# Patient Record
Sex: Female | Born: 1950
Health system: Southern US, Community
[De-identification: ages and names within clinical notes are randomized; demographics above are authoritative.]

## PROBLEM LIST (undated history)

## (undated) DIAGNOSIS — J189 Pneumonia, unspecified organism: Secondary | ICD-10-CM

## (undated) DIAGNOSIS — G4730 Sleep apnea, unspecified: Secondary | ICD-10-CM

## (undated) DIAGNOSIS — Z86718 Personal history of other venous thrombosis and embolism: Secondary | ICD-10-CM

## (undated) DIAGNOSIS — F32A Depression, unspecified: Secondary | ICD-10-CM

## (undated) DIAGNOSIS — R609 Edema, unspecified: Secondary | ICD-10-CM

## (undated) DIAGNOSIS — E7800 Pure hypercholesterolemia, unspecified: Secondary | ICD-10-CM

## (undated) DIAGNOSIS — M1990 Unspecified osteoarthritis, unspecified site: Secondary | ICD-10-CM

## (undated) DIAGNOSIS — I10 Essential (primary) hypertension: Secondary | ICD-10-CM

## (undated) HISTORY — DX: Personal history of other venous thrombosis and embolism: Z86.718

## (undated) HISTORY — DX: Sleep apnea, unspecified: G47.30

## (undated) HISTORY — DX: Unspecified osteoarthritis, unspecified site: M19.90

## (undated) HISTORY — PX: CARPAL TUNNEL RELEASE: SHX101

## (undated) HISTORY — DX: Pneumonia, unspecified organism: J18.9

## (undated) HISTORY — PX: LIPOMA EXCISION: SHX5283

## (undated) HISTORY — PX: REPLACEMENT TOTAL KNEE: SUR1224

## (undated) HISTORY — DX: Pure hypercholesterolemia, unspecified: E78.00

## (undated) HISTORY — DX: Edema, unspecified: R60.9

## (undated) HISTORY — DX: Essential (primary) hypertension: I10

## (undated) HISTORY — DX: Depression, unspecified: F32.A

---

## 2012-06-29 ENCOUNTER — Encounter: Payer: Self-pay | Admitting: Family Medicine

## 2012-06-29 ENCOUNTER — Ambulatory Visit (INDEPENDENT_AMBULATORY_CARE_PROVIDER_SITE_OTHER): Payer: BC Managed Care – PPO | Admitting: Family Medicine

## 2012-06-29 ENCOUNTER — Other Ambulatory Visit: Payer: Self-pay | Admitting: Family Medicine

## 2012-06-29 VITALS — BP 155/74 | HR 65 | Ht 63.75 in | Wt 237.0 lb

## 2012-06-29 DIAGNOSIS — Z1211 Encounter for screening for malignant neoplasm of colon: Secondary | ICD-10-CM

## 2012-06-29 DIAGNOSIS — I10 Essential (primary) hypertension: Secondary | ICD-10-CM

## 2012-06-29 DIAGNOSIS — R229 Localized swelling, mass and lump, unspecified: Secondary | ICD-10-CM

## 2012-06-29 DIAGNOSIS — R2232 Localized swelling, mass and lump, left upper limb: Secondary | ICD-10-CM

## 2012-06-29 NOTE — Progress Notes (Signed)
Subjective:    Patient ID: Margaret Smith, female    DOB: 02-16-1951, 61 y.o.   MRN: [id number]  HPI HTN- BPs 130-160s.  Father died of pancreatic cancer about a month ago. She is here to establish care.  She was told she had high blood pressure about 3 years ago with her previous position. She has not been back since. She says she's been trying to work on exercise and diet. They recently she has not been working out 2 days per week. She says she used to walk daily. She denies any chest pain or short of breath or dizziness. She does have a very strong family history of high blood pressure. No prior history of kidney disease.   Review of Systems  Constitutional: Negative for fever, diaphoresis and unexpected weight change.  HENT: Negative for hearing loss, rhinorrhea and tinnitus.   Eyes: Negative for visual disturbance.  Respiratory: Negative for cough and wheezing.   Cardiovascular: Negative for chest pain and palpitations.  Gastrointestinal: Negative for nausea, vomiting, diarrhea and blood in stool.  Genitourinary: Negative for vaginal bleeding, vaginal discharge and difficulty urinating.  Musculoskeletal: Positive for myalgias and joint swelling. Negative for arthralgias.  Skin: Negative for rash.  Neurological: Negative for headaches.  Hematological: Negative for adenopathy. Does not bruise/bleed easily.  Psychiatric/Behavioral: Positive for disturbed wake/sleep cycle. Negative for dysphoric mood. The patient is not nervous/anxious.      BP 155/74  Pulse 65  Ht 5' 3.75" (1.619 m)  Wt 237 lb (107.502 kg)  BMI 41.00 kg/m2    Not on File  Past Medical History  Diagnosis Date  . Hypertension   . Pneumonia     Past Surgical History  Procedure Date  . Carpal tunnel release 1980s    bilateral.   . Lipoma excision     left forearm.     History   Social History  . Marital Status: Single    Spouse Name: N/A    Number of Children: N/A  . Years of Education: N/A    Occupational History  . Attention officer     [employer]   Social History Main Topics  . Smoking status: Former Smoker    Quit date: 04/08/2004  . Smokeless tobacco: Not on file  . Alcohol Use: No  . Drug Use: No  . Sexually Active: No   Other Topics Concern  . Not on file   Social History Narrative   Walks for exercise 1-2 days per week for 45 min. she lives and takes care of her mother.    Family History  Problem Relation Age of Onset  . Hypertension Mother   . Hypertension Father   . Hypertension Sister   . Hypertension Brother   . Hyperlipidemia Mother   . Diabetes Mother   . Diabetes Father   . Diabetes Maternal Grandfather   . Breast cancer Paternal Aunt   . Heart attack Mother   . Pancreatic cancer Father   . Stroke Mother   . Kidney disease Sister   . Seizures Mother     No outpatient encounter prescriptions on file as of 06/29/2012.          Objective:   Physical Exam  Constitutional: She is oriented to person, place, and time. She appears well-developed and well-nourished.  HENT:  Head: Normocephalic and atraumatic.  Cardiovascular: Normal rate, regular rhythm and normal heart sounds.        No carotid or orbital bruits.  Pulmonary/Chest: Effort normal and  breath sounds normal.  Abdominal: Soft. Bowel sounds are normal. She exhibits no distension and no mass. There is no tenderness. There is no rebound and no guarding.  Musculoskeletal: She exhibits no edema.  Neurological: She is alert and oriented to person, place, and time.  Skin: Skin is warm and dry.  Psychiatric: She has a normal mood and affect. Her behavior is normal.          Assessment & Plan:  HTN- DASH diet.  Work on diet and exercise.  Due to check CMP, Lipids, TSH for new dx of HTN.  We discussed setting a time frame and if not able to get blood pressure under control in that time. Then we need to start a blood pressure lowering agent. We discussed the risks of the  untreated high blood pressure including renal disease, heart attack, and stroke. Hopefully with the DASH diet she can lower her blood pressure by about 5-10 points but she also has a [place] family history of high blood pressure. Followup in 8 weeks. At that time her blood pressures not well controlled then we will start a medication. Patient agrees.  Due for screening colonoscopy. She's okay with referral.  She also has a very large cyst on her middle finger of the left hand. She says it's been there for over a year. Most of the time doesn't bother her unless she bumps it but she would like to have it removed. Will refer her to a Hydrographic surveyor.  She plans to schedule physical at her followup in 8 weeks.

## 2012-06-29 NOTE — Patient Instructions (Signed)

## 2012-06-30 LAB — COMPLETE METABOLIC PANEL WITH GFR
ALT: 14 U/L (ref 0–35)
Albumin: 4.3 g/dL (ref 3.5–5.2)
CO2: 27 mEq/L (ref 19–32)
Calcium: 9.5 mg/dL (ref 8.4–10.5)
Chloride: 106 mEq/L (ref 96–112)
GFR, Est African American: >89 mL/min
Potassium: 4.5 mEq/L (ref 3.5–5.3)
Sodium: 142 mEq/L (ref 135–145)
Total Protein: 7.3 g/dL (ref 6.0–8.3)

## 2012-06-30 LAB — LIPID PANEL: LDL Cholesterol: 122 mg/dL — ABNORMAL HIGH (ref 0–99)

## 2012-06-30 LAB — THYROID PANEL WITH TSH
Free Thyroxine Index: 2.9 (ref 1.0–3.9)
T4, Total: 7.7 ug/dL (ref 5.0–12.5)
TSH: 1.965 u[IU]/mL (ref 0.350–4.500)

## 2012-07-03 ENCOUNTER — Ambulatory Visit (INDEPENDENT_AMBULATORY_CARE_PROVIDER_SITE_OTHER): Payer: BC Managed Care – PPO

## 2012-07-03 ENCOUNTER — Other Ambulatory Visit: Payer: Self-pay | Admitting: Orthopedic Surgery

## 2012-07-03 DIAGNOSIS — R229 Localized swelling, mass and lump, unspecified: Secondary | ICD-10-CM

## 2012-07-03 DIAGNOSIS — IMO0001 Reserved for inherently not codable concepts without codable children: Secondary | ICD-10-CM

## 2012-09-17 ENCOUNTER — Encounter: Payer: Self-pay | Admitting: *Deleted

## 2012-09-17 LAB — HM COLONOSCOPY

## 2013-11-12 ENCOUNTER — Telehealth: Payer: Self-pay | Admitting: Family Medicine

## 2013-11-12 DIAGNOSIS — Z0000 Encounter for general adult medical examination without abnormal findings: Secondary | ICD-10-CM

## 2013-11-12 LAB — HEMOGLOBIN A1C: Hgb A1c MFr Bld: 5.9 % (ref 4.0–6.0)

## 2013-11-12 LAB — LIPID PANEL
Cholesterol: 184 mg/dL (ref 0–200)
LDL Cholesterol: 98 mg/dL
Triglycerides: 139 mg/dL (ref 40–160)

## 2013-11-12 NOTE — Telephone Encounter (Signed)
Pt called. She has scheduled an appt for cpe/fasting  on 12/19 at 3:00

## 2013-11-12 NOTE — Telephone Encounter (Signed)
Pt called and scheduled a cpe/fasting on 12/19 at 3:00. She wants to know if we can have lab Order ready at 2:30 on day of her appt.

## 2013-11-16 ENCOUNTER — Other Ambulatory Visit: Payer: Self-pay | Admitting: Family Medicine

## 2013-11-16 DIAGNOSIS — Z1231 Encounter for screening mammogram for malignant neoplasm of breast: Secondary | ICD-10-CM

## 2013-11-18 NOTE — Telephone Encounter (Signed)
Attempted to call pt to inform her that labs have been placed and faxed no vm.Margaret Smith [place]

## 2013-11-22 NOTE — Telephone Encounter (Signed)
Left message to inform pt that labs have been ordered.[name]

## 2013-11-26 ENCOUNTER — Other Ambulatory Visit (HOSPITAL_COMMUNITY)
Admission: RE | Admit: 2013-11-26 | Discharge: 2013-11-26 | Disposition: A | Discharge status: Home / Self Care | Payer: BC Managed Care – PPO | Source: Ambulatory Visit | Attending: Family Medicine | Admitting: Family Medicine

## 2013-11-26 ENCOUNTER — Ambulatory Visit (INDEPENDENT_AMBULATORY_CARE_PROVIDER_SITE_OTHER): Payer: BC Managed Care – PPO | Admitting: Family Medicine

## 2013-11-26 ENCOUNTER — Encounter: Payer: Self-pay | Admitting: Family Medicine

## 2013-11-26 ENCOUNTER — Encounter: Payer: Self-pay | Admitting: *Deleted

## 2013-11-26 VITALS — BP 128/72 | HR 73 | Temp 98.0°F | Ht 63.0 in | Wt 196.0 lb

## 2013-11-26 DIAGNOSIS — Z0000 Encounter for general adult medical examination without abnormal findings: Secondary | ICD-10-CM

## 2013-11-26 DIAGNOSIS — Z01419 Encounter for gynecological examination (general) (routine) without abnormal findings: Secondary | ICD-10-CM | POA: Insufficient documentation

## 2013-11-26 DIAGNOSIS — Z1151 Encounter for screening for human papillomavirus (HPV): Secondary | ICD-10-CM | POA: Insufficient documentation

## 2013-11-26 DIAGNOSIS — E669 Obesity, unspecified: Secondary | ICD-10-CM | POA: Insufficient documentation

## 2013-11-26 DIAGNOSIS — Z23 Encounter for immunization: Secondary | ICD-10-CM

## 2013-11-26 DIAGNOSIS — Z124 Encounter for screening for malignant neoplasm of cervix: Secondary | ICD-10-CM

## 2013-11-26 NOTE — Patient Instructions (Signed)
Keep up a regular exercise program and make sure you are eating a healthy diet Try to eat 4 servings of dairy a day, or if you are lactose intolerant take a calcium with vitamin D daily.  Your vaccines are up to date.   

## 2013-11-26 NOTE — Progress Notes (Signed)
Subjective:     Margaret Smith is a 62 y.o. female and is here for a comprehensive physical exam. The patient reports no problems.  History   Social History  . Marital Status: Single    Spouse Name: N/A    Number of Children: N/A  . Years of Education: N/A   Occupational History  . 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 Smoking status: Former Smoker    Quit date: 04/08/2004  . Smokeless tobacco: Not on file  . Alcohol Use: No  . Drug Use: No  . Sexual Activity: No   Other Topics Concern  . Not on file   Social History Narrative   Walks for exercise 1-2 days per week for 45 min. she lives and takes care of her mother.   Health Maintenance  Topic Date Due  . Pap Smear  03/17/1970  . Tetanus/tdap  03/18/1971  . Mammogram  03/17/2002  . Zostavax  03/17/2012  . Influenza Vaccine  07/09/2013  . Colonoscopy  09/17/2022    The following portions of the patient's history were reviewed and updated as appropriate: allergies, current medications, past family history, past medical history, past social history, past surgical history and problem list.  Review of Systems A comprehensive review of systems was negative.   Objective:    BP 158/86  Pulse 73  Temp(Src) 98 F (36.7 C)  Ht 5\' 3"  (1.6 m)  Wt 196 lb (88.905 kg)  BMI 34.73 kg/m2 General appearance: alert, cooperative and appears stated age Head: Normocephalic, without obvious abnormality, atraumatic Eyes: conj clear, EOMi, PEERLA Ears: normal TM's and external ear canals both ears Nose: Nares normal. Septum midline. Mucosa normal. No drainage or sinus tenderness. Throat: lips, mucosa, and tongue normal; teeth and gums normal Neck: no adenopathy, no carotid bruit, no JVD, supple, symmetrical, trachea midline and thyroid not enlarged, symmetric, no tenderness/mass/nodules Back: symmetric, no curvature. ROM normal. No CVA tenderness. Lungs: clear to auscultation bilaterally Breasts:  normal appearance, no masses or tenderness Heart: regular rate and rhythm, S1, S2 normal, no murmur, click, rub or gallop Abdomen: soft, non-tender; bowel sounds normal; no masses,  no organomegaly Pelvic: cervix normal in appearance, external genitalia normal, no adnexal masses or tenderness, no cervical motion tenderness, rectovaginal septum normal, uterus normal size, shape, and consistency and vagina normal without discharge Extremities: extremities normal, atraumatic, no cyanosis or edema Pulses: 2+ and symmetric Skin: Skin color, texture, turgor normal. No rashes or lesions Lymph nodes: Cervical, supraclavicular, and axillary nodes normal. Neurologic: Alert and oriented X 3, normal strength and tone. Normal symmetric reflexes. Normal coordination and gait    Assessment:    Healthy female exam.      Plan:     See After Visit Summary for Counseling Recommendations  Mammogram history been scheduled. She is Re: had her flu shot this year. Tdap and Prevnar 13 given today. Pap smear performed today. We'll call with results once available. Keep up a regular exercise program and make sure you are eating a healthy diet Try to eat 4 servings of dairy a day, or if you are lactose intolerant take a calcium with vitamin D daily.  Your vaccines are up to date.  Discussed need for shingles vaccine. She will check with her insurance first. Handout provided.  Obesity/BMI 34-she has an absolutely fantastic. She's actually lost 40 pounds since I last saw her in July. Keep up the good work with regular exercise and  diet. This is absolutely fantastic.

## 2013-11-27 LAB — LIPID PANEL
Cholesterol: 174 mg/dL (ref 0–200)
HDL: 58 mg/dL (ref >39)
Triglycerides: 51 mg/dL (ref <150)
VLDL: 10 mg/dL (ref 0–40)

## 2013-11-27 LAB — COMPLETE METABOLIC PANEL WITH GFR
AST: 15 U/L (ref 0–37)
BUN: 21 mg/dL (ref 6–23)
CO2: 27 mEq/L (ref 19–32)
Calcium: 9 mg/dL (ref 8.4–10.5)
Chloride: 107 mEq/L (ref 96–112)
Creat: 0.64 mg/dL (ref 0.50–1.10)
GFR, Est African American: >89 mL/min
GFR, Est Non African American: >89 mL/min
Potassium: 4.4 mEq/L (ref 3.5–5.3)
Total Bilirubin: 0.4 mg/dL (ref 0.3–1.2)

## 2013-11-30 ENCOUNTER — Ambulatory Visit (INDEPENDENT_AMBULATORY_CARE_PROVIDER_SITE_OTHER): Payer: BC Managed Care – PPO

## 2013-11-30 ENCOUNTER — Encounter: Payer: Self-pay | Admitting: *Deleted

## 2013-11-30 DIAGNOSIS — Z1231 Encounter for screening mammogram for malignant neoplasm of breast: Secondary | ICD-10-CM

## 2013-11-30 DIAGNOSIS — R928 Other abnormal and inconclusive findings on diagnostic imaging of breast: Secondary | ICD-10-CM

## 2013-12-14 ENCOUNTER — Other Ambulatory Visit: Payer: Self-pay | Admitting: Family Medicine

## 2013-12-14 DIAGNOSIS — R928 Other abnormal and inconclusive findings on diagnostic imaging of breast: Secondary | ICD-10-CM

## 2013-12-16 ENCOUNTER — Other Ambulatory Visit: Payer: BC Managed Care – PPO

## 2013-12-17 ENCOUNTER — Ambulatory Visit
Admission: RE | Admit: 2013-12-17 | Discharge: 2013-12-17 | Disposition: A | Discharge status: Home / Self Care | Payer: BC Managed Care – PPO | Source: Ambulatory Visit | Attending: Family Medicine | Admitting: Family Medicine

## 2013-12-17 DIAGNOSIS — R928 Other abnormal and inconclusive findings on diagnostic imaging of breast: Secondary | ICD-10-CM

## 2014-05-10 ENCOUNTER — Other Ambulatory Visit: Payer: Self-pay | Admitting: *Deleted

## 2014-05-10 DIAGNOSIS — N6320 Unspecified lump in the left breast, unspecified quadrant: Secondary | ICD-10-CM

## 2014-06-16 ENCOUNTER — Encounter (INDEPENDENT_AMBULATORY_CARE_PROVIDER_SITE_OTHER): Payer: Self-pay

## 2014-06-16 ENCOUNTER — Ambulatory Visit
Admission: RE | Admit: 2014-06-16 | Discharge: 2014-06-16 | Disposition: A | Discharge status: Home / Self Care | Payer: BC Managed Care – PPO | Source: Ambulatory Visit | Attending: Family Medicine | Admitting: Family Medicine

## 2014-06-16 DIAGNOSIS — N6320 Unspecified lump in the left breast, unspecified quadrant: Secondary | ICD-10-CM

## 2014-11-16 ENCOUNTER — Other Ambulatory Visit: Payer: Self-pay | Admitting: Family Medicine

## 2014-11-16 DIAGNOSIS — Z1231 Encounter for screening mammogram for malignant neoplasm of breast: Secondary | ICD-10-CM

## 2014-12-07 ENCOUNTER — Ambulatory Visit (INDEPENDENT_AMBULATORY_CARE_PROVIDER_SITE_OTHER): Payer: BC Managed Care – PPO

## 2014-12-07 DIAGNOSIS — Z1231 Encounter for screening mammogram for malignant neoplasm of breast: Secondary | ICD-10-CM

## 2014-12-21 ENCOUNTER — Encounter: Payer: BC Managed Care – PPO | Admitting: Family Medicine

## 2014-12-22 ENCOUNTER — Ambulatory Visit (INDEPENDENT_AMBULATORY_CARE_PROVIDER_SITE_OTHER): Payer: BLUE CROSS/BLUE SHIELD | Admitting: Family Medicine

## 2014-12-22 ENCOUNTER — Encounter: Payer: Self-pay | Admitting: Family Medicine

## 2014-12-22 VITALS — BP 139/78 | HR 71 | Ht 63.0 in | Wt 212.0 lb

## 2014-12-22 DIAGNOSIS — Z0000 Encounter for general adult medical examination without abnormal findings: Secondary | ICD-10-CM | POA: Diagnosis not present

## 2014-12-22 DIAGNOSIS — H6123 Impacted cerumen, bilateral: Secondary | ICD-10-CM

## 2014-12-22 LAB — COMPLETE METABOLIC PANEL WITH GFR
ALT: 17 U/L (ref 0–35)
AST: 21 U/L (ref 0–37)
Albumin: 4 g/dL (ref 3.5–5.2)
Alkaline Phosphatase: 71 U/L (ref 39–117)
BUN: 20 mg/dL (ref 6–23)
CO2: 27 mEq/L (ref 19–32)
CREATININE: 0.65 mg/dL (ref 0.50–1.10)
Calcium: 9.5 mg/dL (ref 8.4–10.5)
Chloride: 106 mEq/L (ref 96–112)
GFR, Est African American: >89 mL/min
GFR, Est Non African American: >89 mL/min
Glucose, Bld: 82 mg/dL (ref 70–99)
Potassium: 4.3 mEq/L (ref 3.5–5.3)
Sodium: 139 mEq/L (ref 135–145)
Total Bilirubin: 0.6 mg/dL (ref 0.2–1.2)
Total Protein: 7.5 g/dL (ref 6.0–8.3)

## 2014-12-22 LAB — LIPID PANEL
CHOLESTEROL: 201 mg/dL — AB (ref 0–200)
HDL: 63 mg/dL (ref >39)
LDL CALC: 124 mg/dL — AB (ref 0–99)
TRIGLYCERIDES: 68 mg/dL (ref <150)
Total CHOL/HDL Ratio: 3.2 Ratio
VLDL: 14 mg/dL (ref 0–40)

## 2014-12-22 NOTE — Progress Notes (Signed)
  Subjective:     Margaret Smith is a 64 y.o. female and is here for a comprehensive physical exam. The patient reports no problems. Getting some walking in for exercise daily for one hour. She has gained some weight this last year.    History   Social History  . Marital Status: Single    Spouse Name: N/A    Number of Children: N/A  . Years of Education: N/A   Occupational History  . 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 Smoking status: Former Smoker    Quit date: 04/08/2004  . Smokeless tobacco: Not on file  . Alcohol Use: No  . Drug Use: No  . Sexual Activity: No   Other Topics Concern  . Not on file   Social History Narrative   Walks for exercise 5-7  days per week for 45 min. she lives and takes care of her mother.   Health Maintenance  Topic Date Due  . HIV Screening  03/18/1967  . ZOSTAVAX  03/17/2012  . INFLUENZA VACCINE  07/09/2014  . PAP SMEAR  11/26/2016  . MAMMOGRAM  12/07/2016  . COLONOSCOPY  09/17/2022  . TETANUS/TDAP  11/27/2023    The following portions of the patient's history were reviewed and updated as appropriate: allergies, current medications, past family history, past medical history, past social history, past surgical history and problem list.  Review of Systems A comprehensive review of systems was negative.   Objective:    BP 139/78 mmHg  Pulse 71  Ht 5\' 3"  (1.6 m)  Wt 212 lb (96.163 kg)  BMI 37.56 kg/m2  SpO2 98% General appearance: alert, cooperative and appears stated age Head: Normocephalic, without obvious abnormality, atraumatic Eyes: conj clear, EOMI, PEERLA Ears: normal TM's and external ear canals both ears Nose: Nares normal. Septum midline. Mucosa normal. No drainage or sinus tenderness. Throat: lips, mucosa, and tongue normal; teeth and gums normal Neck: no adenopathy, no carotid bruit, no JVD, supple, symmetrical, trachea midline and thyroid not enlarged, symmetric, no  tenderness/mass/nodules Back: symmetric, no curvature. ROM normal. No CVA tenderness. Lungs: clear to auscultation bilaterally Breasts: normal appearance, no masses or tenderness Heart: regular rate and rhythm, S1, S2 normal, no murmur, click, rub or gallop Abdomen: soft, non-tender; bowel sounds normal; no masses,  no organomegaly Extremities: extremities normal, atraumatic, no cyanosis or edema Pulses: 2+ and symmetric Skin: Skin color, texture, turgor normal. No rashes or lesions Lymph nodes: Cervical, supraclavicular, and axillary nodes normal. Neurologic: Alert and oriented X 3, normal strength and tone. Normal symmetric reflexes. Normal coordination and gait    Assessment:    Healthy female exam.      Plan:     See After Visit Summary for Counseling Recommendations  Keep up a regular exercise program and make sure you are eating a healthy diet Try to eat 4 servings of dairy a day, or if you are lactose intolerant take a calcium with vitamin D daily.  Your vaccines are up to date.   Bilateral cerumen impaction- Irrigation bilateral.  Tolerated well.

## 2014-12-22 NOTE — Patient Instructions (Signed)
Debrox wax softening drops.  Keep up a regular exercise program and make sure you are eating a healthy diet Try to eat 4 servings of dairy a day, or if you are lactose intolerant take a calcium with vitamin D daily.  Your vaccines are up to date.

## 2014-12-23 LAB — HIV ANTIBODY (ROUTINE TESTING W REFLEX): HIV 1&2 Ab, 4th Generation: NONREACTIVE

## 2015-04-12 LAB — HEMOGLOBIN A1C: HEMOGLOBIN A1C: 5.1 % (ref 4.0–6.0)

## 2015-04-12 LAB — LIPID PANEL
Cholesterol: 173 mg/dL (ref 0–200)
HDL: 49 mg/dL (ref 35–70)
LDL Cholesterol: 101 mg/dL
Triglycerides: 112 mg/dL (ref 40–160)

## 2015-04-12 LAB — BASIC METABOLIC PANEL: Glucose: 85 mg/dL

## 2015-06-19 ENCOUNTER — Emergency Department
Admission: EM | Admit: 2015-06-19 | Discharge: 2015-06-19 | Disposition: A | Discharge status: Home / Self Care | Payer: BLUE CROSS/BLUE SHIELD | Source: Home / Self Care | Attending: Family Medicine | Admitting: Family Medicine

## 2015-06-19 ENCOUNTER — Encounter: Payer: Self-pay | Admitting: *Deleted

## 2015-06-19 DIAGNOSIS — M546 Pain in thoracic spine: Secondary | ICD-10-CM

## 2015-06-19 DIAGNOSIS — M542 Cervicalgia: Secondary | ICD-10-CM | POA: Diagnosis not present

## 2015-06-19 DIAGNOSIS — M549 Dorsalgia, unspecified: Secondary | ICD-10-CM

## 2015-06-19 DIAGNOSIS — R0789 Other chest pain: Secondary | ICD-10-CM

## 2015-06-19 MED ORDER — MELOXICAM 7.5 MG PO TABS: 7.5000 mg | ORAL_TABLET | Freq: Every day | ORAL | Status: DC

## 2015-06-19 MED ORDER — CYCLOBENZAPRINE HCL 10 MG PO TABS: 10.0000 mg | ORAL_TABLET | Freq: Two times a day (BID) | ORAL | Status: DC | PRN

## 2015-06-19 NOTE — ED Provider Notes (Signed)
CSN: [id number]     Arrival date & time 06/19/15  1352 History   First MD Initiated Contact with Patient 06/19/15 1405     Chief Complaint  Patient presents with  . Neck Pain  . Shoulder Pain   (Consider location/radiation/quality/duration/timing/severity/associated sxs/prior Treatment) HPI Patient is a 64 year old female presenting to urgent care with complaint of persistent right sided neck, shoulder and right upper chest pain that started 4 days ago after waking from sleeping on a sofa sleeper.  Patient states she had guests over, so she was unable to sleep in her own bed.  Pain is achy, sore sharp on occasion, worse with certain movements and palpation.  Pain is moderate to severe in nature.  She has taken Advil as well as tried a topical cream for muscle pain with minimal relief.  Denies recent falls or known injuries.  Denies numbness and tingling in arms or legs.  Denies history of prior neck or back injuries or surgeries.  Denies rashes, fevers, chills, nausea, vomiting or diarrhea.  Denies difficulty breathing.  Past Medical History  Diagnosis Date  . Hypertension   . Pneumonia    Past Surgical History  Procedure Laterality Date  . Carpal tunnel release  1980s    bilateral.   . Lipoma excision      left forearm.    Family History  Problem Relation Age of Onset  . Hypertension Mother   . Hypertension Father   . Hypertension Sister   . Hypertension Brother   . Hyperlipidemia Mother   . Diabetes Mother   . Diabetes Father   . Diabetes Maternal Grandfather   . Breast cancer Paternal Aunt   . Heart attack Mother   . Pancreatic cancer Father   . Stroke Mother   . Kidney disease Sister   . Seizures Mother    History  Substance Use Topics  . Smoking status: Former Smoker    Quit date: 04/08/2004  . Smokeless tobacco: Not on file  . Alcohol Use: No   OB History    No data available     Review of Systems  Constitutional: Positive for appetite change. Negative for  fever and chills.  Respiratory: Negative for cough and shortness of breath.   Cardiovascular: Positive for chest pain. Negative for palpitations and leg swelling.  Gastrointestinal: Negative for nausea, vomiting, abdominal pain and diarrhea.  Musculoskeletal: Positive for myalgias, arthralgias and neck pain. Negative for back pain, joint swelling, gait problem and neck stiffness.  Skin: Negative for color change and rash.  Neurological: Negative for weakness and numbness.    Allergies  Review of patient's allergies indicates no known allergies.  Home Medications   Prior to Admission medications   Medication Sig Start Date End Date Taking? Authorizing Provider  cyclobenzaprine (FLEXERIL) 10 MG tablet Take 1 tablet (10 mg total) by mouth 2 (two) times daily as needed for muscle spasms. 06/19/15   Junius FinnerErin O'Malley, PA-C  meloxicam (MOBIC) 7.5 MG tablet Take 1 tablet (7.5 mg total) by mouth daily. 06/19/15   Junius FinnerErin O'Malley, PA-C   BP 147/78 mmHg  Pulse 75  Resp 14  Wt 186 lb (84.369 kg)  SpO2 99% Physical Exam  Constitutional: She appears well-developed and well-nourished. No distress.  HENT:  Head: Normocephalic and atraumatic.  Eyes: Conjunctivae are normal. No scleral icterus.  Neck: Normal range of motion. Neck supple. Muscular tenderness present.  No midline bone tenderness, no crepitus or step-offs.    Cardiovascular: Normal rate, regular rhythm and  normal heart sounds.   Pulmonary/Chest: Effort normal and breath sounds normal. No respiratory distress. She has no wheezes. She has no rales. She exhibits tenderness ( Right anterior chest).  Lungs: CTAB. No respiratory distress. No crepitus or deformity of chest.  Abdominal: Soft. Bowel sounds are normal. She exhibits no distension and no mass. There is no tenderness. There is no rebound and no guarding.  Musculoskeletal: Normal range of motion. She exhibits tenderness.  No midline spinal tenderness. FROM upper and lower extremities with  5/5 strength bilaterally.   Tenderness to Right SCM as well as Right upper trapezius and Right anterior chest muscles.  Neurological: She is alert.  Skin: Skin is warm and dry. No rash noted. She is not diaphoretic. No erythema.  Nursing note and vitals reviewed.   ED Course  Procedures (including critical care time) Labs Review Labs Reviewed - No data to display  Imaging Review No results found.   MDM   1. Neck pain on right side   2. Upper back pain on right side   3. Right-sided chest wall pain     Pain appears muscular in nature as it is reproducible with palpation.  No bony tenderness.  No indication for imaging at this time.  Will treat symptomatically. Rx: flexeril and meloxicam. Encouraged to continue alternating ice and heat. Home care instructions including ROM exercises for shoulder given to pt. Advised to f/u with PCP by next week if not improving. Patient verbalized understanding and agreement with treatment plan.     Junius Finner, PA-C 06/19/15 1448

## 2015-06-19 NOTE — ED Notes (Signed)
Pt slept on a sofa sleeper last week and reports awakening with neck, shoulder and some chest pain 4 days ago. Taken Advil otc.

## 2015-06-26 ENCOUNTER — Ambulatory Visit (INDEPENDENT_AMBULATORY_CARE_PROVIDER_SITE_OTHER): Payer: BLUE CROSS/BLUE SHIELD

## 2015-06-26 ENCOUNTER — Encounter: Payer: Self-pay | Admitting: Family Medicine

## 2015-06-26 ENCOUNTER — Ambulatory Visit: Payer: BLUE CROSS/BLUE SHIELD | Admitting: Family Medicine

## 2015-06-26 ENCOUNTER — Ambulatory Visit (INDEPENDENT_AMBULATORY_CARE_PROVIDER_SITE_OTHER): Payer: BLUE CROSS/BLUE SHIELD | Admitting: Family Medicine

## 2015-06-26 VITALS — BP 137/80 | HR 70 | Wt 181.0 lb

## 2015-06-26 DIAGNOSIS — M25562 Pain in left knee: Secondary | ICD-10-CM

## 2015-06-26 DIAGNOSIS — M1711 Unilateral primary osteoarthritis, right knee: Secondary | ICD-10-CM | POA: Insufficient documentation

## 2015-06-26 DIAGNOSIS — M1712 Unilateral primary osteoarthritis, left knee: Secondary | ICD-10-CM

## 2015-06-26 NOTE — Progress Notes (Signed)
Margaret Smith is a 64 y.o. female who [facility]  today for left knee DJD. Patient has a 5 to six-year history of significant arthritis and lack of range of motion of her left knee. She notes daily pain. She has worked on weight loss and was more than 40 pounds over the last several months. She walks daily for exercise. Pain is a predominantly the anterior aspect of the knee and is worse with climbing stairs and sitting with her knee flexed for a prolonged period of time. She denies any specific injury. No fevers chills nausea vomiting or diarrhea.   Past Medical History  Diagnosis Date  . Hypertension   . Pneumonia    Past Surgical History  Procedure Laterality Date  . Carpal tunnel release  1980s    bilateral.   . Lipoma excision      left forearm.    History  Substance Use Topics  . Smoking status: Former Smoker    Quit date: 04/08/2004  . Smokeless tobacco: Not on file  . Alcohol Use: No   ROS as above Medications: Current Outpatient Prescriptions  Medication Sig Dispense Refill  . cyclobenzaprine (FLEXERIL) 10 MG tablet Take 1 tablet (10 mg total) by mouth 2 (two) times daily as needed for muscle spasms. 20 tablet 0  . meloxicam (MOBIC) 7.5 MG tablet Take 1 tablet (7.5 mg total) by mouth daily. 14 tablet 0   No current facility-administered medications for this visit.   No Known Allergies   Exam:  BP 137/80 mmHg  Pulse 70  Wt 181 lb (82.101 kg) Gen: Well NAD HEENT: EOMI,  MMM Lungs: Normal work of breathing. CTABL Heart: RRR no MRG Abd: NABS, Soft. Nondistended, Nontender Exts: Brisk capillary refill, warm and well perfused.  Left knee: Significant genu valgus present no significant effusion. Range of motion 5-90 with 4+ retropatellar crepitations. I range of motion is present both active and passive. Stable ligamentous exam. Intact strength.  No results found for this or any previous visit (from the past 24  hour(s)). No results found.   Please see individual assessment and plan sections.

## 2015-06-26 NOTE — Assessment & Plan Note (Signed)
Left knee DJD based on progressive genu valgus, lack of range of motion and significant crepitations with extension. Based on mechanical symptoms including genu valgus and limited range of motion I suspect more conservative approach such as steroid injections or viscous supplementation will likely not be effective. Her predominant symptom is not pain. Obtain 4 view x-ray of knee and refer to orthopedics for consideration of knee replacement.

## 2015-06-26 NOTE — Patient Instructions (Signed)
Thank you for coming in today. [place].  We will call you with your xray results.  Return as needed.   Total Knee Replacement Total knee replacement is a procedure to replace your knee joint with an artificial knee joint (prosthetic knee joint). The purpose of this surgery is to reduce pain and improve your knee function. LET [facility] PROVID[facility] KNOW ABOUT:   Any allergies you have.  All medicines you are taking, including vitamins, herbs, eye drops, creams, and over-the-counter medicines.  Previous problems you or members of your family have had with the use of anesthetics.  Any blood disorders you have.  Previous surgeries you have had.  Medical conditions you have. RISKS AND COMPLICATIONS  Generally, total knee replacement is a safe procedure. However, problems can occur and include:  Loss of range of motion of the knee or instability.  Loosening of the prosthesis.  Infection.  Persistent pain. BEFORE THE PROCEDURE   Do not eat or drink anything after midnight on the night before the procedure or as directed by your health care provider.  Ask your health care provider about changing or stopping your regular medicines. This is especially important if you are taking diabetes medicines or blood thinners. PROCEDURE   Just before the procedure, you will receive medicine that will make you drowsy (sedative). This will be given through a tube that is inserted into one of your veins (IV tube).  Then you will be given one of the following:  A medicine injected into your spine that numbs your body below the waist (spinal anesthetic).  A medicine that makes you fall asleep (general anesthetic).  You may also receive medicine to block feeling in your leg (nerve block) to help ease pain after surgery.  An incision will be made in your knee. Your surgeon will take out any damaged cartilage and bone by sawing off the damaged surfaces.  The  surgeon will then put a new metal liner over the sawed-off portion of your thigh bone (femur) and a plastic liner over the sawed-off portion of one of the bones of your lower leg (tibia). This is to restore alignment and function to your knee. A plastic piece is often used to restore the surface of your knee cap. AFTER THE PROCEDURE   You will be taken to the recovery area.  You may have drainage tubes to drain excess fluid from your knee. These tubes attach to a device that removes these fluids.  Once you are awake, stable, and taking fluids well, you will be taken to your hospital room.  You will receive physical therapy as prescribed by your health care provider.  Your surgeon may recommend that you spend time (usually an additional 10-14 days) in an extended-care facility to help you begin walking again and improve your range of motion before you go home.  You may also be prescribed blood-thinning medicine to decrease your risk of developing blood clots in your leg. Document Released: 03/03/2001 Document Revised: 04/11/2014 Document Reviewed: 01/05/2012 [facility] Patient Information 2015 [employer] [place]. This information is not intended to replace advice given to you by your health care provider. Make sure you discuss any questions you have with your health care provider.

## 2015-09-08 ENCOUNTER — Encounter: Payer: Self-pay | Admitting: Family Medicine

## 2015-10-19 ENCOUNTER — Other Ambulatory Visit: Payer: Self-pay | Admitting: Family Medicine

## 2015-10-19 DIAGNOSIS — Z1231 Encounter for screening mammogram for malignant neoplasm of breast: Secondary | ICD-10-CM

## 2015-12-13 ENCOUNTER — Ambulatory Visit (INDEPENDENT_AMBULATORY_CARE_PROVIDER_SITE_OTHER): Payer: BLUE CROSS/BLUE SHIELD

## 2015-12-13 DIAGNOSIS — Z1231 Encounter for screening mammogram for malignant neoplasm of breast: Secondary | ICD-10-CM | POA: Diagnosis not present

## 2015-12-28 ENCOUNTER — Other Ambulatory Visit (HOSPITAL_COMMUNITY)
Admission: RE | Admit: 2015-12-28 | Discharge: 2015-12-28 | Disposition: A | Discharge status: Home / Self Care | Payer: BLUE CROSS/BLUE SHIELD | Source: Ambulatory Visit | Attending: Family Medicine | Admitting: Family Medicine

## 2015-12-28 ENCOUNTER — Other Ambulatory Visit: Payer: Self-pay | Admitting: Family Medicine

## 2015-12-28 ENCOUNTER — Encounter: Payer: Self-pay | Admitting: Family Medicine

## 2015-12-28 ENCOUNTER — Ambulatory Visit (INDEPENDENT_AMBULATORY_CARE_PROVIDER_SITE_OTHER): Payer: BLUE CROSS/BLUE SHIELD

## 2015-12-28 ENCOUNTER — Ambulatory Visit (INDEPENDENT_AMBULATORY_CARE_PROVIDER_SITE_OTHER): Payer: BLUE CROSS/BLUE SHIELD | Admitting: Family Medicine

## 2015-12-28 VITALS — BP 134/54 | HR 48 | Ht 63.0 in | Wt 148.0 lb

## 2015-12-28 DIAGNOSIS — Z1159 Encounter for screening for other viral diseases: Secondary | ICD-10-CM

## 2015-12-28 DIAGNOSIS — Z0189 Encounter for other specified special examinations: Secondary | ICD-10-CM

## 2015-12-28 DIAGNOSIS — E049 Nontoxic goiter, unspecified: Secondary | ICD-10-CM

## 2015-12-28 DIAGNOSIS — Z1151 Encounter for screening for human papillomavirus (HPV): Secondary | ICD-10-CM | POA: Insufficient documentation

## 2015-12-28 DIAGNOSIS — R001 Bradycardia, unspecified: Secondary | ICD-10-CM

## 2015-12-28 DIAGNOSIS — E010 Iodine-deficiency related diffuse (endemic) goiter: Secondary | ICD-10-CM | POA: Insufficient documentation

## 2015-12-28 DIAGNOSIS — Z01419 Encounter for gynecological examination (general) (routine) without abnormal findings: Secondary | ICD-10-CM

## 2015-12-28 DIAGNOSIS — Z0000 Encounter for general adult medical examination without abnormal findings: Secondary | ICD-10-CM

## 2015-12-28 LAB — COMPLETE METABOLIC PANEL WITH GFR
ALK PHOS: 56 U/L (ref 33–130)
ALT: 21 U/L (ref 6–29)
AST: 25 U/L (ref 10–35)
Albumin: 4.3 g/dL (ref 3.6–5.1)
BILIRUBIN TOTAL: 0.5 mg/dL (ref 0.2–1.2)
BUN: 16 mg/dL (ref 7–25)
CO2: 28 mmol/L (ref 20–31)
Calcium: 9.8 mg/dL (ref 8.6–10.4)
Chloride: 105 mmol/L (ref 98–110)
Creat: 0.62 mg/dL (ref 0.50–0.99)
GLUCOSE: 71 mg/dL (ref 65–99)
Potassium: 4 mmol/L (ref 3.5–5.3)
Sodium: 142 mmol/L (ref 135–146)
TOTAL PROTEIN: 7.3 g/dL (ref 6.1–8.1)

## 2015-12-28 LAB — LIPID PANEL
CHOL/HDL RATIO: 2.6 ratio (ref <=5.0)
Cholesterol: 190 mg/dL (ref 125–200)
HDL: 73 mg/dL (ref >=46)
LDL Cholesterol: 105 mg/dL (ref <130)
Triglycerides: 59 mg/dL (ref <150)
VLDL: 12 mg/dL (ref <30)

## 2015-12-28 LAB — TSH: TSH: 2.58 u[IU]/mL (ref 0.350–4.500)

## 2015-12-28 NOTE — Patient Instructions (Signed)
Keep up a regular exercise program and make sure you are eating a healthy diet Try to eat 4 servings of dairy a day, or if you are lactose intolerant take a calcium with vitamin D daily.  Your vaccines are up to date.   

## 2015-12-28 NOTE — Progress Notes (Signed)
  Subjective:     Margaret Smith is a 65 y.o. female and is here for a comprehensive physical exam. The patient reports no problems.  Social History   Social History  . Marital Status: Single    Spouse Name: N/A  . Number of Children: N/A  . Years of Education: N/A   Occupational History  . 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 Smoking status: Former Smoker    Quit date: 04/08/2004  . Smokeless tobacco: Not on file  . Alcohol Use: No  . Drug Use: No  . Sexual Activity: No   Other Topics Concern  . Not on file   Social History Narrative   Walks for exercise 5-7  days per week for 45 min. she lives and takes care of her mother.   Health Maintenance  Topic Date Due  . Hepatitis C Screening  [birth year]  . INFLUENZA VACCINE  07/09/2016  . PAP SMEAR  11/26/2016  . MAMMOGRAM  12/12/2017  . COLONOSCOPY  09/17/2022  . TETANUS/TDAP  11/27/2023  . ZOSTAVAX  Completed  . HIV Screening  Completed    The following portions of the patient's history were reviewed and updated as appropriate: allergies, current medications, past family history, past medical history, past social history, past surgical history and problem list.  Review of Systems Pertinent items noted in HPI and remainder of comprehensive ROS otherwise negative.   Objective:    BP 142/60 mmHg  Pulse 46  Ht  (1.6 m)  Wt 148 lb (67.132 kg)  BMI 26.22 kg/m2 General appearance: alert, cooperative and appears stated age Head: Normocephalic, without obvious abnormality, atraumatic Eyes: conj clear. EOMi, PEERLA Ears: normal TM's and external ear canals both ears Nose: Nares normal. Septum midline. Mucosa normal. No drainage or sinus tenderness. Throat: lips, mucosa, and tongue normal; teeth and gums normal Neck: no adenopathy, no carotid bruit, no JVD, supple, symmetrical, trachea midline and thyroid: enlarged and larger on the right Back: symmetric, no curvature. ROM normal. No  CVA tenderness. Lungs: clear to auscultation bilaterally Breasts: normal appearance, no masses or tenderness Heart: regular rate and rhythm, S1, S2 normal, no murmur, click, rub or gallop Abdomen: soft, non-tender; bowel sounds normal; no masses,  no organomegaly Pelvic: cervix normal in appearance, external genitalia normal, no adnexal masses or tenderness, no cervical motion tenderness, rectovaginal septum normal, uterus normal size, shape, and consistency and vagina normal without discharge Extremities: extremities normal, atraumatic, no cyanosis or edema Pulses: 2+ and symmetric Skin: Skin color, texture, turgor normal. No rashes or lesions Lymph nodes: Cervical adenopathy: nl and Supraclavicular adenopathy: nl Neurologic: Alert and oriented X 3, normal strength and tone. Normal symmetric reflexes. Normal coordination and gait    Assessment:    Healthy female exam.      Plan:     See After Visit Summary for Counseling Recommendations   Keep up a regular exercise program and make sure you are eating a healthy diet Try to eat 4 servings of dairy a day, or if you are lactose intolerant take a calcium with vitamin D daily.  Your vaccines are up to date.  CMP and lipids.    Thyromegaly - will schedule for [place] for further eval. Check TSH as well.    Bradycardia - will keep an eye on it. She is asymptomatic.

## 2015-12-29 LAB — HEPATITIS C ANTIBODY: HCV Ab: NEGATIVE

## 2015-12-29 NOTE — Progress Notes (Unsigned)
Lab orders entered, Pt aware.

## 2015-12-30 LAB — T3, FREE: T3, Free: 2.8 pg/mL (ref 2.3–4.2)

## 2015-12-30 LAB — T4, FREE: Free T4: 1.09 ng/dL (ref 0.80–1.80)

## 2016-01-01 LAB — CYTOLOGY - PAP

## 2016-01-26 ENCOUNTER — Other Ambulatory Visit: Payer: Self-pay | Admitting: Family Medicine

## 2016-01-27 LAB — TSH: TSH: 1.95 mIU/L

## 2016-01-27 LAB — T4, FREE: FREE T4: 1.2 ng/dL (ref 0.8–1.8)

## 2016-01-27 LAB — T3, FREE: T3, Free: 2.5 pg/mL (ref 2.3–4.2)

## 2016-02-01 ENCOUNTER — Encounter: Payer: Self-pay | Admitting: *Deleted

## 2016-10-14 ENCOUNTER — Other Ambulatory Visit: Payer: Self-pay | Admitting: Family Medicine

## 2016-10-14 ENCOUNTER — Telehealth: Payer: Self-pay | Admitting: Family Medicine

## 2016-10-14 DIAGNOSIS — Z1231 Encounter for screening mammogram for malignant neoplasm of breast: Secondary | ICD-10-CM

## 2016-10-14 DIAGNOSIS — Z1239 Encounter for other screening for malignant neoplasm of breast: Secondary | ICD-10-CM

## 2016-10-14 NOTE — Addendum Note (Signed)
Addended by: Collie SiadICHARDSON, Shylynn Bruning M on: 10/14/2016 01:49 PM   Modules accepted: Orders

## 2016-10-14 NOTE — Telephone Encounter (Signed)
Ok for order?  

## 2016-10-14 NOTE — Telephone Encounter (Signed)
Pt needs mammogram done in January and would like to go ahead and get the order put in so she can get it scheduled. Thanks

## 2016-10-14 NOTE — Telephone Encounter (Signed)
mammo ordered.

## 2016-12-17 ENCOUNTER — Ambulatory Visit (INDEPENDENT_AMBULATORY_CARE_PROVIDER_SITE_OTHER): Payer: BLUE CROSS/BLUE SHIELD

## 2016-12-17 DIAGNOSIS — Z1231 Encounter for screening mammogram for malignant neoplasm of breast: Secondary | ICD-10-CM | POA: Diagnosis not present

## 2016-12-30 ENCOUNTER — Encounter: Payer: Self-pay | Admitting: Family Medicine

## 2016-12-30 ENCOUNTER — Ambulatory Visit (INDEPENDENT_AMBULATORY_CARE_PROVIDER_SITE_OTHER): Payer: BLUE CROSS/BLUE SHIELD | Admitting: Family Medicine

## 2016-12-30 VITALS — BP 149/62 | HR 55 | Ht 63.0 in | Wt 160.0 lb

## 2016-12-30 DIAGNOSIS — Z13220 Encounter for screening for lipoid disorders: Secondary | ICD-10-CM

## 2016-12-30 DIAGNOSIS — R6889 Other general symptoms and signs: Secondary | ICD-10-CM

## 2016-12-30 DIAGNOSIS — E010 Iodine-deficiency related diffuse (endemic) goiter: Secondary | ICD-10-CM | POA: Diagnosis not present

## 2016-12-30 DIAGNOSIS — R740 Nonspecific elevation of levels of transaminase and lactic acid dehydrogenase [LDH]: Secondary | ICD-10-CM

## 2016-12-30 DIAGNOSIS — R7401 Elevation of levels of liver transaminase levels: Secondary | ICD-10-CM

## 2016-12-30 DIAGNOSIS — Z0000 Encounter for general adult medical examination without abnormal findings: Secondary | ICD-10-CM

## 2016-12-30 LAB — LIPID PANEL
CHOL/HDL RATIO: 2.1 ratio (ref <5.0)
Cholesterol: 201 mg/dL — ABNORMAL HIGH (ref <200)
HDL: 97 mg/dL (ref >50)
LDL CALC: 95 mg/dL (ref <100)
TRIGLYCERIDES: 43 mg/dL (ref <150)
VLDL: 9 mg/dL (ref <30)

## 2016-12-30 LAB — COMPLETE METABOLIC PANEL WITH GFR
ALT: 31 U/L — ABNORMAL HIGH (ref 6–29)
AST: 30 U/L (ref 10–35)
Albumin: 3.8 g/dL (ref 3.6–5.1)
Alkaline Phosphatase: 80 U/L (ref 33–130)
BUN: 22 mg/dL (ref 7–25)
CHLORIDE: 108 mmol/L (ref 98–110)
CO2: 19 mmol/L — AB (ref 20–31)
Calcium: 9.5 mg/dL (ref 8.6–10.4)
Creat: 0.75 mg/dL (ref 0.50–0.99)
GFR, EST NON AFRICAN AMERICAN: 85 mL/min (ref >=60)
Glucose, Bld: 84 mg/dL (ref 65–99)
Potassium: 4.3 mmol/L (ref 3.5–5.3)
SODIUM: 141 mmol/L (ref 135–146)
Total Bilirubin: 0.5 mg/dL (ref 0.2–1.2)
Total Protein: 7.1 g/dL (ref 6.1–8.1)

## 2016-12-30 LAB — POCT INFLUENZA A/B
Influenza A, POC: NEGATIVE
Influenza B, POC: NEGATIVE

## 2016-12-30 LAB — TSH: TSH: 1.89 mIU/L

## 2016-12-30 NOTE — Progress Notes (Signed)
Subjective:     Margaret Smith is a 66 y.o. female and is here for a comprehensive physical exam. The patient reports no problems.  66 year old female also comes in today for cough and nasal congestion for about 3 and half days. She's had some sweats but has not actually checked her temperature. She's been taking Tylenol every 4 hours since Thursday night. She's also been using some sex DM for the cough. She takes care of her elderly mother whom she lives with and wants to make sure that she doesn't have the flu.   Social History   Social History  . Marital status: Single    Spouse name: N/A  . Number of children: N/A  . Years of education: N/A   Occupational History  . 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 Smoking status: Former Smoker    Quit date: 04/08/2004  . Smokeless tobacco: Never Used  . Alcohol use No  . Drug use: No  . Sexual activity: No   Other Topics Concern  . Not on file   Social History Narrative   Walks for exercise 5-7  days per week for 45 min. she lives and takes care of her mother.   Health Maintenance  Topic Date Due  . INFLUENZA VACCINE  07/09/2016  . MAMMOGRAM  12/17/2018  . PAP SMEAR  12/27/2018  . COLONOSCOPY  09/17/2022  . TETANUS/TDAP  11/27/2023  . ZOSTAVAX  Completed  . Hepatitis C Screening  Completed  . HIV Screening  Completed    The following portions of the patient's history were reviewed and updated as appropriate: allergies, current medications, past family history, past medical history, past social history, past surgical history and problem list.  Review of Systems A comprehensive review of systems was negative.   Objective:    BP (!) 152/53   Pulse (!) 55   Ht 5\' 3"  (1.6 m)   Wt 160 lb (72.6 kg)   SpO2 100%   BMI 28.34 kg/m  General appearance: alert, cooperative and appears stated age Head: Normocephalic, without obvious abnormality, atraumatic Eyes: conj clear, EOMI, PEERLA Ears:  normal TM's and external ear canals both ears Nose: Nares normal. Septum midline. Mucosa normal. No drainage or sinus tenderness. Throat: lips, mucosa, and tongue normal; teeth and gums normal Neck: no adenopathy, no carotid bruit, no JVD, supple, symmetrical, trachea midline and thyroid not enlarged, symmetric, no tenderness/mass/nodules Back: symmetric, no curvature. ROM normal. No CVA tenderness. Lungs: clear to auscultation bilaterally Breasts: normal appearance, no masses or tenderness Heart: regular rate and rhythm, S1, S2 normal, no murmur, click, rub or gallop Abdomen: soft, non-tender; bowel sounds normal; no masses,  no organomegaly Extremities: extremities normal, atraumatic, no cyanosis or edema Pulses: 2+ and symmetric Skin: Skin color, texture, turgor normal. No rashes or lesions Lymph nodes: Cervical, supraclavicular, and axillary nodes normal. Neurologic: Alert and oriented X 3, normal strength and tone. Normal symmetric reflexes. Normal coordination and gait    Assessment:    Healthy female exam.      Plan:     See After Visit Summary for Counseling Recommendations   Keep up a regular exercise program and make sure you are eating a healthy diet Try to eat 4 servings of dairy a day, or if you are lactose intolerant take a calcium with vitamin D daily.  Your vaccines are up to date.   URI - likely viral. Flu test is negative. Work on  hygiene.  Ok to continue OTC medications.    Due for pneumonia vaccinne this year when turns 66.

## 2016-12-30 NOTE — Patient Instructions (Signed)
Keep up a regular exercise program and make sure you are eating a healthy diet Try to eat 4 servings of dairy a day, or if you are lactose intolerant take a calcium with vitamin D daily.  Your vaccines are up to date.   

## 2016-12-31 NOTE — Addendum Note (Signed)
Addended by: Deno EtienneBARKLEY, Devone Bonilla L on: 12/31/2016 08:04 AM   Modules accepted: Orders

## 2017-01-13 ENCOUNTER — Ambulatory Visit: Payer: BLUE CROSS/BLUE SHIELD

## 2017-01-14 ENCOUNTER — Ambulatory Visit (INDEPENDENT_AMBULATORY_CARE_PROVIDER_SITE_OTHER): Payer: BLUE CROSS/BLUE SHIELD | Admitting: Family Medicine

## 2017-01-14 VITALS — BP 157/60 | HR 44 | Ht 63.75 in | Wt 162.0 lb

## 2017-01-14 DIAGNOSIS — R030 Elevated blood-pressure reading, without diagnosis of hypertension: Secondary | ICD-10-CM

## 2017-01-14 NOTE — Progress Notes (Signed)
   Subjective:    Patient ID: Margaret Smith, female    DOB: 02/13/1951, 66 y.o.   MRN: [id number]  HPI Pt is here for BP recheck. Pt states that home readings are normal. Pt denies chest pain, shortness of breath, headaches, lightheadiness, or palpitations.     Review of Systems  Respiratory: Negative for shortness of breath.   Cardiovascular: Negative for chest pain and palpitations.  Neurological: Negative for light-headedness and headaches.       Objective:   Physical Exam        Assessment & Plan:  Pt advised to check blood pressure 2 x day. Pt is advised to keep a BP log and return in 1 week with log. She is wanting to stay off meds if possible.   Agree with above.  Nani Gasseratherine Metheney, MD

## 2017-01-21 ENCOUNTER — Ambulatory Visit (INDEPENDENT_AMBULATORY_CARE_PROVIDER_SITE_OTHER): Payer: BLUE CROSS/BLUE SHIELD | Admitting: Family Medicine

## 2017-01-21 DIAGNOSIS — R030 Elevated blood-pressure reading, without diagnosis of hypertension: Secondary | ICD-10-CM | POA: Diagnosis not present

## 2017-01-21 NOTE — Progress Notes (Signed)
Elevated blood pressures-I did review her home pressures she actually has 2 different machines. Home blood pressures primarily running in the 120s over 60s to 70s. Really all of them look absolutely fantastic with a pulse primarily running in the 50s. She did bring in her home machine to verify and it does look like it's accurate with our machine. Thus I do suspect that she probably has some element of whitecoat hypertension.  Margaret Gasseratherine Malaysia Crance, MD

## 2017-01-21 NOTE — Progress Notes (Signed)
Pt advised. Verbalized understanding.

## 2017-01-21 NOTE — Progress Notes (Signed)
Patient came into clinic today for BP check. Pt states she has been taking her BP at home with two automatic machines. Did bring copies of readings into clinic today. Copy will be given to Provider for review. Pt denies any dizziness, headaches, blurred vision, chest pain, palpitations. 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 Pt was also the sole caretaker of her aunt too, but the aunt passed away 12/11/16. Will give home readings to PCP for review, Pt advised we would contact her with any recommendations. Verbalized understanding.

## 2017-01-28 LAB — HEPATIC FUNCTION PANEL
ALK PHOS: 61 U/L (ref 33–130)
ALT: 16 U/L (ref 6–29)
AST: 26 U/L (ref 10–35)
Albumin: 4 g/dL (ref 3.6–5.1)
BILIRUBIN DIRECT: 0.1 mg/dL (ref <=0.2)
BILIRUBIN TOTAL: 0.5 mg/dL (ref 0.2–1.2)
Indirect Bilirubin: 0.4 mg/dL (ref 0.2–1.2)
Total Protein: 6.9 g/dL (ref 6.1–8.1)

## 2017-06-20 DIAGNOSIS — W268XXA Contact with other sharp object(s), not elsewhere classified, initial encounter: Secondary | ICD-10-CM | POA: Diagnosis not present

## 2017-06-20 DIAGNOSIS — Z87891 Personal history of nicotine dependence: Secondary | ICD-10-CM | POA: Diagnosis not present

## 2017-06-20 DIAGNOSIS — S61411A Laceration without foreign body of right hand, initial encounter: Secondary | ICD-10-CM | POA: Diagnosis not present

## 2017-06-30 DIAGNOSIS — Z4802 Encounter for removal of sutures: Secondary | ICD-10-CM | POA: Diagnosis not present

## 2017-08-01 DIAGNOSIS — Z87891 Personal history of nicotine dependence: Secondary | ICD-10-CM | POA: Diagnosis not present

## 2017-08-01 DIAGNOSIS — S61213A Laceration without foreign body of left middle finger without damage to nail, initial encounter: Secondary | ICD-10-CM | POA: Diagnosis not present

## 2017-08-01 DIAGNOSIS — W293XXA Contact with powered garden and outdoor hand tools and machinery, initial encounter: Secondary | ICD-10-CM | POA: Diagnosis not present

## 2017-08-01 DIAGNOSIS — M170 Bilateral primary osteoarthritis of knee: Secondary | ICD-10-CM | POA: Diagnosis not present

## 2017-08-07 DIAGNOSIS — M7989 Other specified soft tissue disorders: Secondary | ICD-10-CM | POA: Diagnosis not present

## 2017-08-07 DIAGNOSIS — S61213D Laceration without foreign body of left middle finger without damage to nail, subsequent encounter: Secondary | ICD-10-CM | POA: Diagnosis not present

## 2017-08-07 DIAGNOSIS — L03011 Cellulitis of right finger: Secondary | ICD-10-CM | POA: Diagnosis not present

## 2017-08-07 DIAGNOSIS — Z87891 Personal history of nicotine dependence: Secondary | ICD-10-CM | POA: Diagnosis not present

## 2017-08-07 DIAGNOSIS — L03012 Cellulitis of left finger: Secondary | ICD-10-CM | POA: Diagnosis not present

## 2017-08-07 DIAGNOSIS — Z4800 Encounter for change or removal of nonsurgical wound dressing: Secondary | ICD-10-CM | POA: Diagnosis not present

## 2017-10-18 ENCOUNTER — Encounter: Payer: Self-pay | Admitting: Emergency Medicine

## 2017-10-18 ENCOUNTER — Emergency Department (INDEPENDENT_AMBULATORY_CARE_PROVIDER_SITE_OTHER)
Admission: EM | Admit: 2017-10-18 | Discharge: 2017-10-18 | Disposition: A | Discharge status: Home / Self Care | Payer: Medicare Other | Source: Home / Self Care | Attending: Family Medicine | Admitting: Family Medicine

## 2017-10-18 DIAGNOSIS — R21 Rash and other nonspecific skin eruption: Secondary | ICD-10-CM

## 2017-10-18 MED ORDER — TRIAMCINOLONE ACETONIDE 40 MG/ML IJ SUSP
40.0000 mg | Freq: Once | INTRAMUSCULAR | Status: AC
  Administered 2017-10-18: 40 mg via INTRAMUSCULAR

## 2017-10-18 NOTE — ED Provider Notes (Signed)
[facility]    CSN: [id number] Arrival date & time: 10/18/17  1449     History   Chief Complaint Chief Complaint  Patient presents with  . Pruritis    HPI Margaret Smith is a 66 y.o. female.   After going for a walk yesterday evening, patient developed generalized pruritus upon returning home.  She then noticed a rash on her back that has spread to her arms and legs.  The rash and itching improved somewhat after taking Benadryl.  Today after walking, she again developed pruritic rash on her torso, arms, and legs.  She feels well otherwise.  No known contact with allergens.   The history is provided by the patient.  Rash  Location: torso, arms, legs. Quality: dryness, itchiness and redness   Quality: not blistering, not bruising, not burning, not draining, not painful, not peeling, not scaling, not swelling and not weeping   Severity:  Mild Onset quality:  Sudden Duration:  2 days Timing:  Constant Progression:  Worsening Chronicity:  New Context: not animal contact, not chemical exposure, not exposure to similar rash, not food, not hot tub use, not insect bite/sting, not medications, not new detergent/soap, not nuts, not plant contact and not pollen   Relieved by:  Nothing Worsened by:  Nothing Ineffective treatments:  Antihistamines Associated symptoms: no abdominal pain, no diarrhea, no fatigue, no fever, no headaches, no induration, no joint pain, no myalgias, no nausea, no periorbital edema, no shortness of breath, no sore throat, no throat swelling, no tongue swelling, no URI and not wheezing     Past Medical History:  Diagnosis Date  . Hypertension   . Pneumonia     Patient Active Problem List   Diagnosis Date Noted  . White coat syndrome without diagnosis of hypertension 01/21/2017  . Bradycardia 12/28/2015  . Thyromegaly 12/28/2015  . Left knee DJD 06/26/2015    Past Surgical History:  Procedure Laterality Date  . CARPAL TUNNEL RELEASE   1980s   bilateral.   . LIPOMA EXCISION     left forearm.   . REPLACEMENT TOTAL KNEE Left    Dr. Pill    OB History    No data available       Home Medications    Prior to Admission medications   Not on File    Family History Family History  Problem Relation Age of Onset  . Hypertension Father   . Diabetes Father   . Pancreatic cancer Father   . Hypertension Mother   . Hyperlipidemia Mother   . Diabetes Mother   . Heart attack Mother   . Stroke Mother   . Seizures Mother   . Hypertension Sister   . Hypertension Brother   . Diabetes Maternal Grandfather   . Breast cancer Paternal Aunt   . Kidney disease Sister     Social History Social History   Tobacco Use  . Smoking status: Former Smoker    Last attempt to quit: 04/08/2004    Years since quitting: 13.5  . Smokeless tobacco: Never Used  Substance Use Topics  . Alcohol use: No  . Drug use: No     Allergies   Patient has no known allergies.   Review of Systems Review of Systems  Constitutional: Negative for fatigue and fever.  HENT: Negative for sore throat.   Respiratory: Negative for shortness of breath and wheezing.   Gastrointestinal: Negative for abdominal pain, diarrhea and nausea.  Musculoskeletal: Negative for arthralgias and myalgias.  Skin: Positive for rash.  Neurological: Negative for headaches.  All other systems reviewed and are negative.    Physical Exam Triage Vital Signs ED Triage Vitals  Enc Vitals Group     BP 10/18/17 1515 (!) 157/76     Pulse Rate 10/18/17 1515 (!) 44     Resp --      Temp 10/18/17 1515 98.3 F (36.8 C)     Temp Source 10/18/17 1515 Oral     SpO2 10/18/17 1515 99 %     Weight 10/18/17 1515 191 lb 4 oz (86.8 kg)     Height 10/18/17 1515 5\' 3"  (1.6 m)     Head Circumference --      Peak Flow --      Pain Score 10/18/17 1516 0     Pain Loc --      Pain Edu? --      Excl. in GC? --    No data found.  Updated Vital Signs BP (!) 157/76 (BP  Location: Right Arm)   Pulse (!) 44   Temp 98.3 F (36.8 C) (Oral)   Ht 5\' 3"  (1.6 m)   Wt 191 lb 4 oz (86.8 kg)   SpO2 99%   BMI 33.88 kg/m   Visual Acuity Right Eye Distance:   Left Eye Distance:   Bilateral Distance:    Right Eye Near:   Left Eye Near:    Bilateral Near:     Physical Exam  Constitutional: She appears well-developed and well-nourished. No distress.  HENT:  Head: Normocephalic.  Right Ear: External ear normal.  Left Ear: External ear normal.  Nose: Nose normal.  Mouth/Throat: Oropharynx is clear and moist.  Eyes: Conjunctivae are normal. Pupils are equal, round, and reactive to light.  Neck: Neck supple.  Cardiovascular: Normal heart sounds.  Pulmonary/Chest: Breath sounds normal.  Abdominal: There is no tenderness.  Musculoskeletal: She exhibits no edema.  Lymphadenopathy:    She has no cervical adenopathy.  Neurological: She is alert.  Skin: Skin is warm and dry.     Urticarial eruption in locations as noted on diagram.   Nursing note and vitals reviewed.    UC Treatments / Results  Labs (all labs ordered are listed, but only abnormal results are displayed) Labs Reviewed - No data to display  EKG  EKG Interpretation None       Radiology No results found.  Procedures Procedures (including critical care time)  Medications Ordered in UC Medications  triamcinolone acetonide (KENALOG-40) injection 40 mg (not administered)     Initial Impression / Assessment and Plan / UC Course  I have reviewed the triage vital signs and the nursing notes.  Pertinent labs & imaging results that were available during my care of the patient were reviewed by me and considered in my medical decision making (see chart for details).    Suspect allergic reaction unknown etiology. Administered Kenalog 40mg  IM May take Benadryl as needed for itching. Followup with Family Doctor if not improved in about 4 days.    Final Clinical Impressions(s) / UC  Diagnoses   Final diagnoses:  Rash and nonspecific skin eruption    ED Discharge Orders    None           Lattie HawBeese, Keiona Jenison A, MD 10/20/17 2132

## 2017-10-18 NOTE — ED Triage Notes (Signed)
Patient complaining of all over the body itching...went walking last night, came in from walking, started itching on arms, back and legs. Today she went walking and when she came in from walking, the same thing happened again.  Patient does have redness on arms, legs and back.

## 2017-10-18 NOTE — Discharge Instructions (Signed)
May take Benadryl as needed for itching. °

## 2017-12-05 DIAGNOSIS — Z23 Encounter for immunization: Secondary | ICD-10-CM | POA: Diagnosis not present

## 2018-07-08 ENCOUNTER — Other Ambulatory Visit: Payer: Self-pay | Admitting: Family Medicine

## 2018-07-08 DIAGNOSIS — Z1239 Encounter for other screening for malignant neoplasm of breast: Secondary | ICD-10-CM

## 2018-08-05 ENCOUNTER — Ambulatory Visit (INDEPENDENT_AMBULATORY_CARE_PROVIDER_SITE_OTHER): Payer: Medicare Other | Admitting: Family Medicine

## 2018-08-05 ENCOUNTER — Encounter: Payer: Self-pay | Admitting: Family Medicine

## 2018-08-05 ENCOUNTER — Ambulatory Visit (INDEPENDENT_AMBULATORY_CARE_PROVIDER_SITE_OTHER): Payer: Medicare Other

## 2018-08-05 VITALS — BP 163/56 | HR 41 | Temp 98.3°F | Ht 62.5 in | Wt 148.0 lb

## 2018-08-05 DIAGNOSIS — Z23 Encounter for immunization: Secondary | ICD-10-CM | POA: Diagnosis not present

## 2018-08-05 DIAGNOSIS — Z13220 Encounter for screening for lipoid disorders: Secondary | ICD-10-CM

## 2018-08-05 DIAGNOSIS — Z13820 Encounter for screening for osteoporosis: Secondary | ICD-10-CM

## 2018-08-05 DIAGNOSIS — R001 Bradycardia, unspecified: Secondary | ICD-10-CM

## 2018-08-05 DIAGNOSIS — Z780 Asymptomatic menopausal state: Secondary | ICD-10-CM

## 2018-08-05 DIAGNOSIS — Z0000 Encounter for general adult medical examination without abnormal findings: Secondary | ICD-10-CM

## 2018-08-05 DIAGNOSIS — R030 Elevated blood-pressure reading, without diagnosis of hypertension: Secondary | ICD-10-CM

## 2018-08-05 DIAGNOSIS — Z1231 Encounter for screening mammogram for malignant neoplasm of breast: Secondary | ICD-10-CM | POA: Diagnosis not present

## 2018-08-05 DIAGNOSIS — Z1239 Encounter for other screening for malignant neoplasm of breast: Secondary | ICD-10-CM

## 2018-08-05 NOTE — Progress Notes (Signed)
Exam she had bilateral cerumen impaction in both ears.  She has noticed some decrease in hearing and has had recurrence of this problem before.  She asked that we irrigate them today.  Indication: Cerumen impaction of the ear(s) Medical necessity statement: On physical examination, cerumen impairs clinically significant portions of the external auditory canal, and tympanic membrane. Noted obstructive, copious cerumen that cannot be removed without magnification and irrigation Consent: Discussed benefits and risks of procedure and verbal consent obtained Procedure: Patient was prepped for the procedure. Utilized an otoscope to assess and take note of the ear canal, the tympanic membrane, and the presence, amount, and placement of the cerumen. Gentle water irrigation and soft plastic curette was utilized to remove cerumen.  Post procedure examination: shows cerumen was completely removed. Patient tolerated procedure well. The patient is made aware that they may experience temporary vertigo, temporary hearing loss, and temporary discomfort. If these symptom last for more than 24 hours to call the clinic or proceed to the ED.

## 2018-08-05 NOTE — Patient Instructions (Addendum)
Keep up the great job on exercise. Continue to monitor your blood pressure regularly. Encourage you to get your flu shot done at the pharmacy or you are always welcome to come here once we get the high-dose vaccine in stock. You have completed your pneumonia vaccine series.    Iron-Rich Diet Iron is a mineral that helps your body to produce hemoglobin. Hemoglobin is a protein in your red blood cells that carries oxygen to your body's tissues. Eating too little iron may cause you to feel weak and tired, and it can increase your risk for infection. Eating enough iron is necessary for your body's metabolism, muscle function, and nervous system. Iron is naturally found in many foods. It can also be added to foods or fortified in foods. There are two types of dietary iron:  Heme iron. Heme iron is absorbed by the body more easily than nonheme iron. Heme iron is found in meat, poultry, and fish.  Nonheme iron. Nonheme iron is found in dietary supplements, iron-fortified grains, beans, and vegetables.  You may need to follow an iron-rich diet if:  You have been diagnosed with iron deficiency or iron-deficiency anemia.  You have a condition that prevents you from absorbing dietary iron, such as: ? Infection in your intestines. ? Celiac disease. This involves long-lasting (chronic) inflammation of your intestines.  You do not eat enough iron.  You eat a diet that is high in foods that impair iron absorption.  You have lost a lot of blood.  You have heavy bleeding during your menstrual cycle.  You are pregnant.  What is my plan? Your health care provider may help you to determine how much iron you need per day based on your condition. Generally, when a person consumes sufficient amounts of iron in the diet, the following iron needs are met:  Men. ? 55-11 years old: 11 mg per day. ? 78-22 years old: 8 mg per day.  Women. ? 7-50 years old: 15 mg per day. ? 25-85 years old: 18 mg per  day. ? Over 54 years old: 8 mg per day. ? Pregnant women: 27 mg per day. ? Breastfeeding women: 9 mg per day.  What do I need to know about an iron-rich diet?  Eat fresh fruits and vegetables that are high in vitamin C along with foods that are high in iron. This will help increase the amount of iron that your body absorbs from food, especially with foods containing nonheme iron. Foods that are high in vitamin C include oranges, peppers, tomatoes, and mango.  Take iron supplements only as directed by your health care provider. Overdose of iron can be life-threatening. If you were prescribed iron supplements, take them with orange juice or a vitamin C supplement.  Cook foods in pots and pans that are made from iron.  Eat nonheme iron-containing foods alongside foods that are high in heme iron. This helps to improve your iron absorption.  Certain foods and drinks contain compounds that impair iron absorption. Avoid eating these foods in the same meal as iron-rich foods or with iron supplements. These include: ? Coffee, black tea, and red wine. ? Milk, dairy products, and foods that are high in calcium. ? Beans, soybeans, and peas. ? Whole grains.  When eating foods that contain both nonheme iron and compounds that impair iron absorption, follow these tips to absorb iron better. ? Soak beans overnight before cooking. ? Soak whole grains overnight and drain them before using. ? Ferment flours before  baking, such as using yeast in bread dough. What foods can I eat? Grains Iron-fortified breakfast cereal. Iron-fortified whole-wheat bread. Enriched rice. Sprouted grains. Vegetables Spinach. Potatoes with skin. Green peas. Broccoli. Red and green bell peppers. Fermented vegetables. Fruits Prunes. Raisins. Oranges. Strawberries. Mango. Grapefruit. Meats and Other Protein Sources Beef liver. Oysters. Beef. Shrimp. [place]. Chicken. [place]. Sardines. Chickpeas. Nuts. Tofu. Beverages Tomato  juice. Fresh orange juice. Prune juice. Hibiscus tea. Fortified instant breakfast shakes. Condiments Tahini. Fermented soy sauce. Sweets and Desserts Black-strap molasses. Other Wheat germ. The items listed above may not be a complete list of recommended foods or beverages. Contact your dietitian for more options. What foods are not recommended? Grains Whole grains. Bran cereal. Bran flour. Oats. Vegetables Artichokes. Brussels sprouts. Kale. Fruits Blueberries. Raspberries. Strawberries. Figs. Meats and Other Protein Sources Soybeans. Products made from soy protein. Dairy Milk. Cream. Cheese. Yogurt. Cottage cheese. Beverages Coffee. Black tea. Red wine. Sweets and Desserts Cocoa. Chocolate. Ice cream. Other Basil. Oregano. Parsley. The items listed above may not be a complete list of foods and beverages to avoid. Contact your dietitian for more information. This information is not intended to replace advice given to you by your health care provider. Make sure you discuss any questions you have with your health care provider. Document Released: 07/09/2005 Document Revised: 06/14/2016 Document Reviewed: 06/22/2014 Elsevier Interactive Patient Education  [employer].

## 2018-08-05 NOTE — Progress Notes (Signed)
Subjective:   Margaret Smith is a 67 y.o. female who presents for Medicare Annual (Subsequent) preventive examination. She has whitecoat hypertension and did bring in her home blood pressures that she is been tracking for several weeks.  They really look fantastic with systolic blood pressures ranging between 99 up to 122.  With diastolics ranging in the 60s.  Heart rate typically in the 40s to 50s.  She also wants a list of iron rich foods.  She likes to donate blood periodically and the last time she went she was told that her iron levels were high enough to donate.  Review of Systems:  Comprehensive of review of systems is negative except for HPI.       Objective:     Vitals: BP (!) 163/56   Pulse (!) 41   Temp 98.3 F (36.8 C) (Oral)   Ht 5' 2.5" (1.588 m)   Wt 148 lb (67.1 kg)   BMI 26.64 kg/m   Body mass index is 26.64 kg/m.  Advanced Directives 12/22/2014  Does Patient Have a Medical Advance Directive? No  Would patient like information on creating a medical advance directive? Yes - Transport planner given    Tobacco Social History   Tobacco Use  Smoking Status Former Smoker  . Last attempt to quit: 04/08/2004  . Years since quitting: 14.3  Smokeless Tobacco Never Used     Counseling given: Not Answered   Clinical Intake:       Physical Exam  Constitutional: She is oriented to person, place, and time. She appears well-developed and well-nourished.  HENT:  Head: Normocephalic and atraumatic.  Right Ear: External ear normal.  Left Ear: External ear normal.  Nose: Nose normal.  Mouth/Throat: Oropharynx is clear and moist.  Both canals blocked by cerumen.  After irrigation and removal with currette canals were clear.    Eyes: Pupils are equal, round, and reactive to light. Conjunctivae and EOM are normal.  Neck: Neck supple. No thyromegaly present.  Cardiovascular: Normal rate, regular rhythm and normal heart sounds.  Pulmonary/Chest: Effort normal  and breath sounds normal. She has no wheezes.  Lymphadenopathy:    She has no cervical adenopathy.  Neurological: She is alert and oriented to person, place, and time.  Skin: Skin is warm and dry.  Psychiatric: She has a normal mood and affect.                   Past Medical History:  Diagnosis Date  . Hypertension   . Pneumonia    Past Surgical History:  Procedure Laterality Date  . CARPAL TUNNEL RELEASE  1980s   bilateral.   . LIPOMA EXCISION     left forearm.   . REPLACEMENT TOTAL KNEE Left    Dr. Corinna Capra   Family History  Problem Relation Age of Onset  . Hypertension Father   . Diabetes Father   . Pancreatic cancer Father   . Hypertension Mother   . Hyperlipidemia Mother   . Diabetes Mother   . Heart attack Mother   . Stroke Mother   . Seizures Mother   . Hypertension Sister   . Hypertension Brother   . Diabetes Maternal Grandfather   . Breast cancer Paternal Aunt   . Kidney disease Sister    Social History   Socioeconomic History  . Marital status: Single    Spouse name: Not on file  . Number of children: Not on file  . Years of education: Not on file  .  Highest education level: Not on file  Occupational History  . Occupation: Attention officer    Comment: [employer]  Social Needs  . Financial resource strain: Not on file  . Food insecurity:    Worry: Not on file    Inability: Not on file  . Transportation needs:    Medical: Not on file    Non-medical: Not on file  Tobacco Use  . Smoking status: Former Smoker    Last attempt to quit: 04/08/2004    Years since quitting: 14.3  . Smokeless tobacco: Never Used  Substance and Sexual Activity  . Alcohol use: No  . Drug use: No  . Sexual activity: Never  Lifestyle  . Physical activity:    Days per week: Not on file    Minutes per session: Not on file  . Stress: Not on file  Relationships  . Social connections:    Talks on phone: Not on file    Gets together: Not on file    Attends  religious service: Not on file    Active member of club or organization: Not on file    Attends meetings of clubs or organizations: Not on file    Relationship status: Not on file  Other Topics Concern  . Not on file  Social History Narrative   Walks for exercise 5-7  days per week for 45 min. she lives and takes care of her mother.    No outpatient encounter medications on file as of 08/05/2018.   No facility-administered encounter medications on file as of 08/05/2018.     Activities of Daily Living In your present state of health, do you have any difficulty performing the following activities: 08/05/2018  Hearing? Y  Vision? N  Difficulty concentrating or making decisions? N  Walking or climbing stairs? Y  Dressing or bathing? N  Doing errands, shopping? N  Some recent data might be hidden    Patient Care Team: Agapito Games, MD as PCP - General (Family Medicine)    Assessment:   This is a routine wellness examination for Margaret Smith.  Exercise Activities and Dietary recommendations Current Exercise Habits: Home exercise routine, Type of exercise: walking(walks 3-4 miles), Frequency (Times/Week): 7  Goals   None     Fall Risk Fall Risk  08/05/2018 08/05/2018  Falls in the past year? - Yes  Number falls in past yr: - 1  Injury with Fall? No No  Follow up Education provided -   Is the patient's home free of loose throw rugs in walkways, pet beds, electrical cords, etc?   yes       Timed Get Up and Go performed: Normal  Depression Screen PHQ 2/9 Scores 08/05/2018 08/05/2018 08/05/2018  PHQ - 2 Score 0 0 0  PHQ- 9 Score - 0 -     Cognitive Function     6CIT Screen 08/05/2018  What Year? 0 points  What month? 0 points  What time? 0 points  Count back from 20 0 points  Months in reverse 0 points  Repeat phrase 0 points  Total Score 0    Immunization History  Administered Date(s) Administered  . Influenza,inj,Quad PF,6+ Mos 11/18/2014, 12/28/2016  .  Influenza,inj,quad, With Preservative 09/08/2015  . Influenza-Unspecified 10/13/2013, 09/09/2015  . Pneumococcal Conjugate-13 11/26/2013  . Pneumococcal Polysaccharide-23 08/05/2018  . Tdap 11/26/2013  . Zoster 11/18/2014    Qualifies for Shingles Vaccine? Discussed new vaccine  Screening Tests Health Maintenance  Topic Date Due  . DEXA  SCAN  03/17/2017  . INFLUENZA VACCINE  03/25/2019 (Originally 07/09/2018)  . MAMMOGRAM  12/17/2018  . COLONOSCOPY  09/17/2022  . TETANUS/TDAP  11/27/2023  . Hepatitis C Screening  Completed  . PNA vac Low Risk Adult  Completed    Cancer Screenings: Lung: Low Dose CT Chest recommended if Age 50-80 years, 30 pack-year currently smoking OR have quit w/in 15years. Patient does not qualify. Breast:  Up to date on Mammogram? Yes   Up to date of Bone Density/Dexa? No Colorectal: Up to date  Additional Screenings: : Hepatitis C Screening:      Plan:   Medicare Wellness EXam   I have personally reviewed and noted the following in the patient's chart:   . Medical and social history . Use of alcohol, tobacco or illicit drugs  . Current medications and supplements . Functional ability and status . Nutritional status . Physical activity . Advanced directives . List of other physicians - none . Hospitalizations, surgeries, and ER visits in previous 12 months . Vitals . Screenings to include cognitive, depression, and falls - negative, except for falls.  She states he tripped near the elevator because she was carrying a lot of bags trying not to have to make multiple trips. . Referrals and appointments . Pneumo 23 given today. Marland Kitchen. She will get flu shot at the pharmacy.   . Provided letter for left knee prosthesis that she will be traveling and flying later this year. . Discussed ways to increase her iron levels including taking a multivitamin that has iron in it or even a prenatal vitamin.  In addition to iron rich diet.  Handout provided. . She did  have her mammogram done this morning. . Order for bone density placed today.  In addition, I have reviewed and discussed with patient certain preventive protocols, quality metrics, and best practice recommendations. A written personalized care plan for preventive services as well as general preventive health recommendations were provided to patient.     Nani Gasseratherine Kelechi Astarita, MD  08/05/2018

## 2018-08-06 LAB — COMPLETE METABOLIC PANEL WITH GFR
AG RATIO: 1.4 (calc) (ref 1.0–2.5)
ALT: 16 U/L (ref 6–29)
AST: 23 U/L (ref 10–35)
Albumin: 4.4 g/dL (ref 3.6–5.1)
Alkaline phosphatase (APISO): 67 U/L (ref 33–130)
BUN: 19 mg/dL (ref 7–25)
CALCIUM: 9.6 mg/dL (ref 8.6–10.4)
CO2: 29 mmol/L (ref 20–32)
CREATININE: 0.71 mg/dL (ref 0.50–0.99)
Chloride: 104 mmol/L (ref 98–110)
GFR, EST AFRICAN AMERICAN: 103 mL/min/{1.73_m2} (ref >=60)
GFR, EST NON AFRICAN AMERICAN: 89 mL/min/{1.73_m2} (ref >=60)
GLOBULIN: 3.1 g/dL (ref 1.9–3.7)
Glucose, Bld: 87 mg/dL (ref 65–99)
POTASSIUM: 4.1 mmol/L (ref 3.5–5.3)
SODIUM: 141 mmol/L (ref 135–146)
TOTAL PROTEIN: 7.5 g/dL (ref 6.1–8.1)
Total Bilirubin: 0.5 mg/dL (ref 0.2–1.2)

## 2018-08-06 LAB — TSH: TSH: 1.54 mIU/L (ref 0.40–4.50)

## 2018-08-06 LAB — LIPID PANEL
CHOL/HDL RATIO: 2.3 (calc) (ref <5.0)
Cholesterol: 210 mg/dL — ABNORMAL HIGH (ref <200)
HDL: 91 mg/dL (ref >50)
LDL Cholesterol (Calc): 106 mg/dL (calc) — ABNORMAL HIGH
NON-HDL CHOLESTEROL (CALC): 119 mg/dL (ref <130)
TRIGLYCERIDES: 44 mg/dL (ref <150)

## 2018-10-02 DIAGNOSIS — Z23 Encounter for immunization: Secondary | ICD-10-CM | POA: Diagnosis not present

## 2019-09-24 ENCOUNTER — Other Ambulatory Visit: Payer: Self-pay | Admitting: Family Medicine

## 2019-09-24 DIAGNOSIS — Z1231 Encounter for screening mammogram for malignant neoplasm of breast: Secondary | ICD-10-CM

## 2019-10-07 ENCOUNTER — Ambulatory Visit: Payer: Medicare Other

## 2019-10-12 ENCOUNTER — Encounter: Payer: Self-pay | Admitting: Family Medicine

## 2019-10-12 ENCOUNTER — Other Ambulatory Visit: Payer: Self-pay

## 2019-10-12 ENCOUNTER — Ambulatory Visit (INDEPENDENT_AMBULATORY_CARE_PROVIDER_SITE_OTHER): Payer: Medicare Other | Admitting: Family Medicine

## 2019-10-12 VITALS — BP 136/74 | HR 50 | Ht 63.0 in | Wt 139.0 lb

## 2019-10-12 DIAGNOSIS — Z13220 Encounter for screening for lipoid disorders: Secondary | ICD-10-CM

## 2019-10-12 DIAGNOSIS — Z23 Encounter for immunization: Secondary | ICD-10-CM

## 2019-10-12 DIAGNOSIS — R001 Bradycardia, unspecified: Secondary | ICD-10-CM

## 2019-10-12 DIAGNOSIS — E010 Iodine-deficiency related diffuse (endemic) goiter: Secondary | ICD-10-CM

## 2019-10-12 DIAGNOSIS — Z0000 Encounter for general adult medical examination without abnormal findings: Secondary | ICD-10-CM | POA: Diagnosis not present

## 2019-10-12 MED ORDER — SHINGRIX 50 MCG/0.5ML IM SUSR: 0.5000 mL | Freq: Once | INTRAMUSCULAR | 0 refills | Status: AC

## 2019-10-12 NOTE — Patient Instructions (Signed)
Health Maintenance for Postmenopausal Women Menopause is a normal process in which your ability to get pregnant comes to an end. This process happens slowly over many months or years, usually between the ages of 62 and 18. Menopause is complete when you have missed your menstrual periods for 12 months. It is important to talk with your health care provider about some of the most common conditions that affect women after menopause (postmenopausal women). These include heart disease, cancer, and bone loss (osteoporosis). Adopting a healthy lifestyle and getting preventive care can help to promote your health and wellness. The actions you take can also lower your chances of developing some of these common conditions. What should I know about menopause? During menopause, you may get a number of symptoms, such as:  Hot flashes. These can be moderate or severe.  Night sweats.  Decrease in sex drive.  Mood swings.  Headaches.  Tiredness.  Irritability.  Memory problems.  Insomnia. Choosing to treat or not to treat these symptoms is a decision that you make with your health care provider. Do I need hormone replacement therapy?  Hormone replacement therapy is effective in treating symptoms that are caused by menopause, such as hot flashes and night sweats.  Hormone replacement carries certain risks, especially as you become older. If you are thinking about using estrogen or estrogen with progestin, discuss the benefits and risks with your health care provider. What is my risk for heart disease and stroke? The risk of heart disease, heart attack, and stroke increases as you age. One of the causes may be a change in the body's hormones during menopause. This can affect how your body uses dietary fats, triglycerides, and cholesterol. Heart attack and stroke are medical emergencies. There are many things that you can do to help prevent heart disease and stroke. Watch your blood pressure  High  blood pressure causes heart disease and increases the risk of stroke. This is more likely to develop in people who have high blood pressure readings, are of African descent, or are overweight.  Have your blood pressure checked: ? Every 3-5 years if you are 76-7 years of age. ? Every year if you are 45 years old or older. Eat a healthy diet   Eat a diet that includes plenty of vegetables, fruits, low-fat dairy products, and lean protein.  Do not eat a lot of foods that are high in solid fats, added sugars, or sodium. Get regular exercise Get regular exercise. This is one of the most important things you can do for your health. Most adults should:  Try to exercise for at least 150 minutes each week. The exercise should increase your heart rate and make you sweat (moderate-intensity exercise).  Try to do strengthening exercises at least twice each week. Do these in addition to the moderate-intensity exercise.  Spend less time sitting. Even light physical activity can be beneficial. Other tips  Work with your health care provider to achieve or maintain a healthy weight.  Do not use any products that contain nicotine or tobacco, such as cigarettes, e-cigarettes, and chewing tobacco. If you need help quitting, ask your health care provider.  Know your numbers. Ask your health care provider to check your cholesterol and your blood sugar (glucose). Continue to have your blood tested as directed by your health care provider. Do I need screening for cancer? Depending on your health history and family history, you may need to have cancer screening at different stages of your life. This  may include screening for:  Breast cancer.  Cervical cancer.  Lung cancer.  Colorectal cancer. What is my risk for osteoporosis? After menopause, you may be at increased risk for osteoporosis. Osteoporosis is a condition in which bone destruction happens more quickly than new bone creation. To help prevent  osteoporosis or the bone fractures that can happen because of osteoporosis, you may take the following actions:  If you are 42-49 years old, get at least 1,000 mg of calcium and at least 600 mg of vitamin D per day.  If you are older than age 4 but younger than age 62, get at least 1,200 mg of calcium and at least 600 mg of vitamin D per day.  If you are older than age 3, get at least 1,200 mg of calcium and at least 800 mg of vitamin D per day. Smoking and drinking excessive alcohol increase the risk of osteoporosis. Eat foods that are rich in calcium and vitamin D, and do weight-bearing exercises several times each week as directed by your health care provider. How does menopause affect my mental health? Depression may occur at any age, but it is more common as you become older. Common symptoms of depression include:  Low or sad mood.  Changes in sleep patterns.  Changes in appetite or eating patterns.  Feeling an overall lack of motivation or enjoyment of activities that you previously enjoyed.  Frequent crying spells. Talk with your health care provider if you think that you are experiencing depression. General instructions See your health care provider for regular wellness exams and vaccines. This may include:  Scheduling regular health, dental, and eye exams.  Getting and maintaining your vaccines. These include: ? Influenza vaccine. Get this vaccine each year before the flu season begins. ? Pneumonia vaccine. ? Shingles vaccine. ? Tetanus, diphtheria, and pertussis (Tdap) booster vaccine. Your health care provider may also recommend other immunizations. Tell your health care provider if you have ever been abused or do not feel safe at home. Summary  Menopause is a normal process in which your ability to get pregnant comes to an end.  This condition causes hot flashes, night sweats, decreased interest in sex, mood swings, headaches, or lack of sleep.  Treatment for this  condition may include hormone replacement therapy.  Take actions to keep yourself healthy, including exercising regularly, eating a healthy diet, watching your weight, and checking your blood pressure and blood sugar levels.  Get screened for cancer and depression. Make sure that you are up to date with all your vaccines. This information is not intended to replace advice given to you by your health care provider. Make sure you discuss any questions you have with your health care provider. Document Released: 01/17/2006 Document Revised: 11/18/2018 Document Reviewed: 11/18/2018 Elsevier Patient Education  2020 [employer].  Health Maintenance After Age 1 After age 8, you are at a higher risk for certain long-term diseases and infections as well as injuries from falls. Falls are a major cause of broken bones and head injuries in people who are older than age 28. Getting regular preventive care can help to keep you healthy and well. Preventive care includes getting regular testing and making lifestyle changes as recommended by your health care provider. Talk with your health care provider about:  Which screenings and tests you should have. A screening is a test that checks for a disease when you have no symptoms.  A diet and exercise plan that is right for you. What  should I know about screenings and tests to prevent falls? Screening and testing are the best ways to find a health problem early. Early diagnosis and treatment give you the best chance of managing medical conditions that are common after age 67. Certain conditions and lifestyle choices may make you more likely to have a fall. Your health care provider may recommend:  Regular vision checks. Poor vision and conditions such as cataracts can make you more likely to have a fall. If you wear glasses, make sure to get your prescription updated if your vision changes.  Medicine review. Work with your health care provider to regularly review  all of the medicines you are taking, including over-the-counter medicines. Ask your health care provider about any side effects that may make you more likely to have a fall. Tell your health care provider if any medicines that you take make you feel dizzy or sleepy.  Osteoporosis screening. Osteoporosis is a condition that causes the bones to get weaker. This can make the bones weak and cause them to break more easily.  Blood pressure screening. Blood pressure changes and medicines to control blood pressure can make you feel dizzy.  Strength and balance checks. Your health care provider may recommend certain tests to check your strength and balance while standing, walking, or changing positions.  Foot health exam. Foot pain and numbness, as well as not wearing proper footwear, can make you more likely to have a fall.  Depression screening. You may be more likely to have a fall if you have a fear of falling, feel emotionally low, or feel unable to do activities that you used to do.  Alcohol use screening. Using too much alcohol can affect your balance and may make you more likely to have a fall. What actions can I take to lower my risk of falls? General instructions  Talk with your health care provider about your risks for falling. Tell your health care provider if: ? You fall. Be sure to tell your health care provider about all falls, even ones that seem minor. ? You feel dizzy, sleepy, or off-balance.  Take over-the-counter and prescription medicines only as told by your health care provider. These include any supplements.  Eat a healthy diet and maintain a healthy weight. A healthy diet includes low-fat dairy products, low-fat (lean) meats, and fiber from whole grains, beans, and lots of fruits and vegetables. Home safety  Remove any tripping hazards, such as rugs, cords, and clutter.  Install safety equipment such as grab bars in bathrooms and safety rails on stairs.  Keep rooms and  walkways well-lit. Activity   Follow a regular exercise program to stay fit. This will help you maintain your balance. Ask your health care provider what types of exercise are appropriate for you.  If you need a cane or walker, use it as recommended by your health care provider.  Wear supportive shoes that have nonskid soles. Lifestyle  Do not drink alcohol if your health care provider tells you not to drink.  If you drink alcohol, limit how much you have: ? 0-1 drink a day for women. ? 0-2 drinks a day for men.  Be aware of how much alcohol is in your drink. In the [place] one drink equals one typical bottle of beer (12 oz), one-half glass of wine (5 oz), or one shot of hard liquor (1 oz).  Do not use any products that contain nicotine or tobacco, such as cigarettes and e-cigarettes. If you need  help quitting, ask your health care provider. Summary  Having a healthy lifestyle and getting preventive care can help to protect your health and wellness after age 66.  Screening and testing are the best way to find a health problem early and help you avoid having a fall. Early diagnosis and treatment give you the best chance for managing medical conditions that are more common for people who are older than age 45.  Falls are a major cause of broken bones and head injuries in people who are older than age 59. Take precautions to prevent a fall at home.  Work with your health care provider to learn what changes you can make to improve your health and wellness and to prevent falls. This information is not intended to replace advice given to you by your health care provider. Make sure you discuss any questions you have with your health care provider. Document Released: 10/08/2017 Document Revised: 03/18/2019 Document Reviewed: 10/08/2017 Elsevier Patient Education  2020 [employer].

## 2019-10-12 NOTE — Progress Notes (Signed)
Subjective:   Margaret Smith is a 68 y.o. female who presents for Medicare Annual (Subsequent) preventive examination.  Review of Systems:  Comprehensive review of systems is negative. Cardiac Risk Factors include: advanced age (>2255men, 74>65 women);obesity (BMI >30kg/m2)     Objective:     Vitals: BP 136/74   Pulse (!) 50   Ht 5\' 3"  (1.6 m)   Wt 139 lb (63 kg)   SpO2 100%   BMI 24.62 kg/m   Body mass index is 24.62 kg/m.  Advanced Directives 12/22/2014  Does Patient Have a Medical Advance Directive? No  Would patient like information on creating a medical advance directive? Yes - Transport plannerducational materials given    Tobacco Social History   Tobacco Use  Smoking Status Former Smoker  . Quit date: 04/08/2004  . Years since quitting: 15.5  Smokeless Tobacco Never Used     Counseling given: Not Answered   Clinical Intake: She is doing really well overall.  She really wants to maintain her current weight not lose any extra weight.  She had been eating 1 meal a day and 2 smoothies is no replacements but says she is can switch that out for 2 meals a month smoothly.  She recently had a cousin that died in a motor vehicle accident the last couple weeks so that has affected her mood somewhat and she says reflux and her scores for her depression screen.  We did discuss the shingles vaccine today as well.  She is interested in getting her flu shot.  And she already has her mammogram scheduled.     Physical Exam Constitutional:      Appearance: She is well-developed.  HENT:     Head: Normocephalic and atraumatic.     Right Ear: External ear normal.     Left Ear: External ear normal.     Nose: Nose normal.  Eyes:     Conjunctiva/sclera: Conjunctivae normal.     Pupils: Pupils are equal, round, and reactive to light.  Neck:     Musculoskeletal: Neck supple.     Thyroid: No thyromegaly.  Cardiovascular:     Rate and Rhythm: Normal rate and regular rhythm.     Heart sounds: Normal  heart sounds.  Pulmonary:     Effort: Pulmonary effort is normal.     Breath sounds: Normal breath sounds. No wheezing.  Abdominal:     General: Abdomen is flat.     Palpations: Abdomen is soft.  Musculoskeletal:        General: No swelling.  Lymphadenopathy:     Cervical: No cervical adenopathy.  Skin:    General: Skin is warm and dry.  Neurological:     General: No focal deficit present.     Mental Status: She is alert and oriented to person, place, and time.  Psychiatric:        Mood and Affect: Mood normal.        Thought Content: Thought content normal.                        Past Medical History:  Diagnosis Date  . Hypertension   . Pneumonia    Past Surgical History:  Procedure Laterality Date  . CARPAL TUNNEL RELEASE  1980s   bilateral.   . LIPOMA EXCISION     left forearm.   . REPLACEMENT TOTAL KNEE Left    Dr. Corinna CapraPill   Family History  Problem Relation Age of Onset  .  Hypertension Father   . Diabetes Father   . Pancreatic cancer Father   . Hypertension Mother   . Hyperlipidemia Mother   . Diabetes Mother   . Heart attack Mother   . Stroke Mother   . Seizures Mother   . Hypertension Sister   . Hypertension Brother   . Diabetes Maternal Grandfather   . Breast cancer Paternal Aunt   . Kidney disease Sister    Social History   Socioeconomic History  . Marital status: Single    Spouse name: Not on file  . Number of children: Not on file  . Years of education: Not on file  . Highest education level: Not on file  Occupational History  . Occupation: Attention officer    Comment: [employer]  . Financial resource strain: Not on file  . Food insecurity    Worry: Not on file    Inability: Not on file  . Transportation needs    Medical: Not on file    Non-medical: Not on file  Tobacco Use  . Smoking status: Former Smoker    Quit date: 04/08/2004    Years since quitting: 15.5  . Smokeless tobacco: Never Used  Substance  and Sexual Activity  . Alcohol use: No  . Drug use: No  . Sexual activity: Never  Lifestyle  . Physical activity    Days per week: Not on file    Minutes per session: Not on file  . Stress: Not on file  Relationships  . Social Herbalist on phone: Not on file    Gets together: Not on file    Attends religious service: Not on file    Active member of club or organization: Not on file    Attends meetings of clubs or organizations: Not on file    Relationship status: Not on file  Other Topics Concern  . Not on file  Social History Narrative   Walks for exercise 5-7  days per week for 45 min. she lives and takes care of her mother.    Outpatient Encounter Medications as of 10/12/2019  Medication Sig  . [EXPIRED] Zoster Vaccine Adjuvanted [facility]) injection Inject 0.5 mLs into the muscle once for 1 dose.   No facility-administered encounter medications on file as of 10/12/2019.     Activities of Daily Living In your present state of health, do you have any difficulty performing the following activities: 10/13/2019  Hearing? N  Vision? N  Difficulty concentrating or making decisions? N  Walking or climbing stairs? N  Dressing or bathing? N  Doing errands, shopping? N  Preparing Food and eating ? N  Using the Toilet? N  In the past six months, have you accidently leaked urine? N  Do you have problems with loss of bowel control? N  Managing your Medications? N  Managing your Finances? N  Housekeeping or managing your Housekeeping? N  Some recent data might be hidden    Patient Care Team: Hali Marry, MD as PCP - General (Family Medicine)    Assessment:   This is a routine wellness examination for Margaret Smith.  Exercise Activities and Dietary recommendations Current Exercise Habits: Home exercise routine, Type of exercise: walking, Time (Minutes): > 60(5 miles per day), Frequency (Times/Week): 7, Weekly Exercise (Minutes/Week): 0, Intensity:  Moderate  Goals   None     Fall Risk Fall Risk  10/12/2019 08/05/2018 08/05/2018  Falls in the past year? 1 - Yes  Number falls in past yr: 1 - 1  Injury with Fall? 1 No No  Risk for fall due to : Other (Comment) - -  Risk for fall due to: Comment stumbled on uneven pavement - -  Follow up Falls prevention discussed Education provided -    Depression Screen PHQ 2/9 Scores 10/12/2019 08/05/2018 08/05/2018 08/05/2018  PHQ - 2 Score 3 0 0 0  PHQ- 9 Score 7 - 0 -     Cognitive Function     6CIT Screen 10/12/2019 08/05/2018  What Year? 0 points 0 points  What month? 0 points 0 points  What time? 0 points 0 points  Count back from 20 0 points 0 points  Months in reverse 0 points 0 points  Repeat phrase 2 points 0 points  Total Score 2 0    Immunization History  Administered Date(s) Administered  . Fluad Quad(high Dose 65+) 10/12/2019  . Influenza,inj,Quad PF,6+ Mos 11/18/2014, 12/28/2016  . Influenza,inj,quad, With Preservative 09/08/2015  . Influenza-Unspecified 10/13/2013, 09/09/2015  . Pneumococcal Conjugate-13 11/26/2013  . Pneumococcal Polysaccharide-23 08/05/2018  . Tdap 11/26/2013  . Zoster 11/18/2014    Qualifies for Shingles Vaccine?Yes  Screening Tests Health Maintenance  Topic Date Due  . MAMMOGRAM  08/05/2020  . COLONOSCOPY  09/17/2022  . TETANUS/TDAP  11/27/2023  . INFLUENZA VACCINE  Completed  . DEXA SCAN  Completed  . Hepatitis C Screening  Completed  . PNA vac Low Risk Adult  Completed    Cancer Screenings: Lung: Low Dose CT Chest recommended if Age 44-80 years, 30 pack-year currently smoking OR have quit w/in 15years. Patient does not qualify. Breast:  Up to date on Mammogram? No  , scheduled.   Up to date of Bone Density/Dexa? Yes Colorectal: Yes  Additional Screenings: : Hepatitis C Screening:      Plan:    Keep up a regular exercise program and make sure you are eating a healthy diet Try to eat 4 servings of dairy a day, or if you are  lactose intolerant take a calcium with vitamin D daily.  Your vaccines are up to date.     I have personally reviewed and noted the following in the patient's chart:   . Medical and social history . Use of alcohol, tobacco or illicit drugs  . Current medications and supplements . Functional ability and status . Nutritional status . Physical activity . Advanced directives . List of other physicians . Hospitalizations, surgeries, and ER visits in previous 12 months . Vitals . Screenings to include cognitive, depression, and falls . Referrals and appointments  In addition, I have reviewed and discussed with patient certain preventive protocols, quality metrics, and best practice recommendations. A written personalized care plan for preventive services as well as general preventive health recommendations were provided to patient.    Nani Gasser, MD  10/13/2019

## 2019-10-13 ENCOUNTER — Other Ambulatory Visit: Payer: Self-pay

## 2019-10-13 ENCOUNTER — Encounter: Payer: Self-pay | Admitting: Family Medicine

## 2019-10-13 ENCOUNTER — Ambulatory Visit (INDEPENDENT_AMBULATORY_CARE_PROVIDER_SITE_OTHER): Payer: Medicare Other

## 2019-10-13 DIAGNOSIS — Z1231 Encounter for screening mammogram for malignant neoplasm of breast: Secondary | ICD-10-CM | POA: Diagnosis not present

## 2019-10-13 LAB — COMPLETE METABOLIC PANEL WITH GFR
AG Ratio: 1.6 (calc) (ref 1.0–2.5)
ALT: 15 U/L (ref 6–29)
AST: 21 U/L (ref 10–35)
Albumin: 4.2 g/dL (ref 3.6–5.1)
Alkaline phosphatase (APISO): 65 U/L (ref 37–153)
BUN: 21 mg/dL (ref 7–25)
CO2: 29 mmol/L (ref 20–32)
Calcium: 9.6 mg/dL (ref 8.6–10.4)
Chloride: 108 mmol/L (ref 98–110)
Creat: 0.76 mg/dL (ref 0.50–0.99)
GFR, Est African American: 94 mL/min/{1.73_m2} (ref >=60)
GFR, Est Non African American: 81 mL/min/{1.73_m2} (ref >=60)
Globulin: 2.6 g/dL (calc) (ref 1.9–3.7)
Glucose, Bld: 82 mg/dL (ref 65–99)
Potassium: 4.1 mmol/L (ref 3.5–5.3)
Sodium: 143 mmol/L (ref 135–146)
Total Bilirubin: 0.5 mg/dL (ref 0.2–1.2)
Total Protein: 6.8 g/dL (ref 6.1–8.1)

## 2019-10-13 LAB — TSH: TSH: 2.21 mIU/L (ref 0.40–4.50)

## 2019-10-13 LAB — LIPID PANEL
Cholesterol: 195 mg/dL (ref <200)
HDL: 69 mg/dL (ref >=50)
LDL Cholesterol (Calc): 113 mg/dL (calc) — ABNORMAL HIGH
Non-HDL Cholesterol (Calc): 126 mg/dL (calc) (ref <130)
Total CHOL/HDL Ratio: 2.8 (calc) (ref <5.0)
Triglycerides: 52 mg/dL (ref <150)

## 2019-11-01 ENCOUNTER — Other Ambulatory Visit: Payer: Self-pay

## 2020-06-09 ENCOUNTER — Encounter: Payer: Self-pay | Admitting: Family Medicine

## 2020-06-09 NOTE — Progress Notes (Signed)
Colon entered 

## 2020-09-15 ENCOUNTER — Other Ambulatory Visit: Payer: Self-pay | Admitting: Family Medicine

## 2020-09-15 DIAGNOSIS — Z1231 Encounter for screening mammogram for malignant neoplasm of breast: Secondary | ICD-10-CM

## 2020-10-10 DIAGNOSIS — Z23 Encounter for immunization: Secondary | ICD-10-CM | POA: Diagnosis not present

## 2020-10-18 ENCOUNTER — Ambulatory Visit: Payer: Medicare Other

## 2020-10-18 ENCOUNTER — Other Ambulatory Visit: Payer: Self-pay

## 2020-10-19 ENCOUNTER — Ambulatory Visit (INDEPENDENT_AMBULATORY_CARE_PROVIDER_SITE_OTHER): Payer: Medicare Other

## 2020-10-19 ENCOUNTER — Ambulatory Visit: Payer: Medicare Other

## 2020-10-19 ENCOUNTER — Ambulatory Visit (INDEPENDENT_AMBULATORY_CARE_PROVIDER_SITE_OTHER): Payer: Medicare Other | Admitting: Family Medicine

## 2020-10-19 ENCOUNTER — Encounter: Payer: Self-pay | Admitting: Family Medicine

## 2020-10-19 VITALS — BP 173/68 | HR 51 | Ht 63.0 in | Wt 150.0 lb

## 2020-10-19 DIAGNOSIS — R221 Localized swelling, mass and lump, neck: Secondary | ICD-10-CM | POA: Diagnosis not present

## 2020-10-19 DIAGNOSIS — Z0000 Encounter for general adult medical examination without abnormal findings: Secondary | ICD-10-CM

## 2020-10-19 DIAGNOSIS — E785 Hyperlipidemia, unspecified: Secondary | ICD-10-CM | POA: Diagnosis not present

## 2020-10-19 DIAGNOSIS — Z23 Encounter for immunization: Secondary | ICD-10-CM | POA: Diagnosis not present

## 2020-10-19 DIAGNOSIS — R030 Elevated blood-pressure reading, without diagnosis of hypertension: Secondary | ICD-10-CM | POA: Diagnosis not present

## 2020-10-19 DIAGNOSIS — M11261 Other chondrocalcinosis, right knee: Secondary | ICD-10-CM | POA: Diagnosis not present

## 2020-10-19 DIAGNOSIS — M25561 Pain in right knee: Secondary | ICD-10-CM | POA: Diagnosis not present

## 2020-10-19 DIAGNOSIS — Z13220 Encounter for screening for lipoid disorders: Secondary | ICD-10-CM

## 2020-10-19 DIAGNOSIS — M25461 Effusion, right knee: Secondary | ICD-10-CM | POA: Diagnosis not present

## 2020-10-19 NOTE — Progress Notes (Signed)
Subjective:   Margaret Smith is a 69 y.o. female who presents for Medicare Annual (Subsequent) preventive examination.  Review of Systems    ROS is negative.        Objective:    Today's Vitals   10/19/20 0949  BP: (!) 173/68  Pulse: (!) 51  SpO2: 100%  Weight: 150 lb (68 kg)  Height: 5\' 3"  (1.6 m)   Body mass index is 26.57 kg/m.  Advanced Directives 12/22/2014  Does Patient Have a Medical Advance Directive? No  Would patient like information on creating a medical advance directive? Yes - Educational materials given    Current Medications (verified) No outpatient encounter medications on file as of 10/19/2020.   No facility-administered encounter medications on file as of 10/19/2020.    Allergies (verified) Patient has no known allergies.   History: Past Medical History:  Diagnosis Date  . Hypertension   . Pneumonia    Past Surgical History:  Procedure Laterality Date  . CARPAL TUNNEL RELEASE  1980s   bilateral.   . LIPOMA EXCISION     left forearm.   . REPLACEMENT TOTAL KNEE Left    Dr. 13/10/2020   Family History  Problem Relation Age of Onset  . Hypertension Father   . Diabetes Father   . Pancreatic cancer Father   . Hypertension Mother   . Hyperlipidemia Mother   . Diabetes Mother   . Heart attack Mother   . Stroke Mother   . Seizures Mother   . Hypertension Sister   . Hypertension Brother   . Diabetes Maternal Grandfather   . Breast cancer Paternal Aunt   . Kidney disease Sister    Social History   Socioeconomic History  . Marital status: Single    Spouse name: Not on file  . Number of children: Not on file  . Years of education: Not on file  . Highest education level: Not on file  Occupational History  . Occupation: Attention officer    Comment: [employer]  Tobacco Use  . Smoking status: Former Smoker    Quit date: 04/08/2004    Years since quitting: 16.5  . Smokeless tobacco: Never Used  Substance and Sexual Activity  .  Alcohol use: No  . Drug use: No  . Sexual activity: Never  Other Topics Concern  . Not on file  Social History Narrative   Walks for exercise 5-7  days per week for 45 min. she lives and takes care of her mother.   Social Determinants of Health   Financial Resource Strain:   . Difficulty of Paying Living Expenses: Not on file  Food Insecurity:   . Worried About 06/08/2004 in the Last Year: Not on file  . Ran Out of Food in the Last Year: Not on file  Transportation Needs:   . Lack of Transportation (Medical): Not on file  . Lack of Transportation (Non-Medical): Not on file  Physical Activity:   . Days of Exercise per Week: Not on file  . Minutes of Exercise per Session: Not on file  Stress:   . Feeling of Stress : Not on file  Social Connections:   . Frequency of Communication with Friends and Family: Not on file  . Frequency of Social Gatherings with Friends and Family: Not on file  . Attends Religious Services: Not on file  . Active Member of Clubs or Organizations: Not on file  . Attends Programme researcher, broadcasting/film/video Meetings: Not on file  .  Marital Status: Not on file    Tobacco Counseling Counseling given: Not Answered   Clinical Intake:  Pre-visit preparation completed: Yes She has a couple of concerns today including a cyst at her jaw line below her right ear. Has been there for a couple fo year. No pain or tenderness. Not sure if changing in size.    She also has a nail that looked dark and then fell off and then grew back in. Now the nail looks dark again.     C/O Right knee pain for several weeks. Walks several miles regularly. Had a sudden pain in her right knee while walking in the kitchen and it was so painful for 2 weeks was unable to walk for exercise.        Diabetic?No  Interpreter Needed?: No    Activities of Daily Living In your present state of health, do you have any difficulty performing the following activities: 10/20/2020  Hearing? N   Vision? N  Difficulty concentrating or making decisions? Y  Comment Has noticed a change in her memory says its not significant and she is not worried but has noticed she is a little bit more forgetful than she used to be.  Walking or climbing stairs? Y  Comment Secondary to knee pain.  Dressing or bathing? N  Doing errands, shopping? N  Preparing Food and eating ? N  Using the Toilet? N  Do you have problems with loss of bowel control? N  Managing your Medications? N  Managing your Finances? N  Housekeeping or managing your Housekeeping? N  Some recent data might be hidden    Patient Care Team: Agapito Games, MD as PCP - General (Family Medicine)  Indicate any recent Medical Services you may have received from other than [facility] providers in the past year (date may be approximate).     Assessment:   This is a routine wellness examination for Margaret Smith.  Hearing/Vision screen No exam data present  Dietary issues and exercise activities discussed: Current Exercise Habits: Home exercise routine, Type of exercise: walking, Frequency (Times/Week): 5  Goals   None    Depression Screen PHQ 2/9 Scores 10/19/2020 10/12/2019 08/05/2018 08/05/2018 08/05/2018  PHQ - 2 Score 0 3 0 0 0  PHQ- 9 Score - 7 - 0 -    Fall Risk Fall Risk  10/20/2020 11/01/2019 10/12/2019 08/05/2018 08/05/2018  Falls in the past year? 0 1 1 - Yes  Comment - Emmi Telephone Survey: data to providers prior to load - - -  Number falls in past yr: - 1 1 - 1  Comment - Emmi Telephone Survey Actual Response = 1 - - -  Injury with Fall? - 0 1 No No  Risk for fall due to : No Fall Risks - Other (Comment) - -  Risk for fall due to: Comment - - stumbled on uneven pavement - -  Follow up Falls evaluation completed - Falls prevention discussed Education provided -     ASSISTIVE DEVICES UTILIZED TO PREVENT FALLS:  Life alert? No  Use of a cane, walker or w/c? No   TIMED UP AND GO:  Was the test performed? No .     Gait steady and fast without use of assistive device  Cognitive Function:     6CIT Screen 10/12/2019 08/05/2018  What Year? 0 points 0 points  What month? 0 points 0 points  What time? 0 points 0 points  Count back from 20 0 points 0  points  Months in reverse 0 points 0 points  Repeat phrase 2 points 0 points  Total Score 2 0    Immunizations Immunization History  Administered Date(s) Administered  . Fluad Quad(high Dose 65+) 10/12/2019  . Influenza,inj,Quad PF,6+ Mos 11/18/2014, 12/28/2016  . Influenza,inj,quad, With Preservative 09/08/2015  . Influenza-Unspecified 10/13/2013, 09/09/2015  . Moderna SARS-COVID-2 Vaccination 02/17/2020, 03/09/2020, 10/14/2020  . Pneumococcal Conjugate-13 11/26/2013  . Pneumococcal Polysaccharide-23 08/05/2018  . Tdap 11/26/2013  . Zoster 11/18/2014    TDAP status: Up to date Flu Vaccine status: Up to date Pneumococcal vaccine status: Up to date Covid-19 vaccine status: Completed vaccines  Qualifies for Shingles Vaccine? UTD  Screening Tests Health Maintenance  Topic Date Due  . INFLUENZA VACCINE  07/09/2020  . MAMMOGRAM  10/12/2021  . COLONOSCOPY  09/17/2022  . TETANUS/TDAP  11/27/2023  . DEXA SCAN  Completed  . COVID-19 Vaccine  Completed  . Hepatitis C Screening  Completed  . PNA vac Low Risk Adult  Completed    Health Maintenance  Health Maintenance Due  Topic Date Due  . INFLUENZA VACCINE  07/09/2020   Health Maintenance  Topic Date Due  . INFLUENZA VACCINE  07/09/2020  . MAMMOGRAM  10/12/2021  . COLONOSCOPY  09/17/2022  . TETANUS/TDAP  11/27/2023  . DEXA SCAN  Completed  . COVID-19 Vaccine  Completed  . Hepatitis C Screening  Completed  . PNA vac Low Risk Adult  Completed     Lung Cancer Screening: (Low Dose CT Chest recommended if Age 87-80 years, 30 pack-year currently smoking OR have quit w/in 15years.) does not qualify.   Lung Cancer Screening Referral: NA  Additional Screening:  Hepatitis C  Screening: does not qualify; Completed - YEs  Vision Screening: Recommended annual ophthalmology exams for early detection of glaucoma and other disorders of the eye. Is the patient up to date with their annual eye exam?  Yes  Who is the provider or what is the name of the office in which the patient attends annual eye exams? Dr. Louis Meckel, in [place]  Dental Screening: Recommended annual dental exams for proper oral hygiene  Community Resource Referral / Chronic Care Management: CRR required this visit?  No   CCM required this visit?  No      Plan:     I have personally reviewed and noted the following in the patient's chart:   . Medical and social history . Use of alcohol, tobacco or illicit drugs  . Current medications and supplements . Functional ability and status . Nutritional status . Physical activity . Advanced directives . List of other physicians . Hospitalizations, surgeries, and ER visits in previous 12 months . Vitals . Screenings to include cognitive, depression, and falls . Referrals and appointments  In addition, I have reviewed and discussed with patient certain preventive protocols, quality metrics, and best practice recommendations. A written personalized care plan for preventive services as well as general preventive health recommendations were provided to patient.     Nani Gasser, MD   10/20/2020

## 2020-10-19 NOTE — Patient Instructions (Signed)
  Ms. Mcclish , Thank you for taking time to come for your Medicare Wellness Visit. I appreciate your ongoing commitment to your health goals. Please review the following plan we discussed and let me know if I can assist you in the future.   These are the goals we discussed: Goals   None     This is a list of the screening recommended for you and due dates:  Health Maintenance  Topic Date Due  . Flu Shot  07/09/2020  . Mammogram  10/12/2021  . Colon Cancer Screening  09/17/2022  . Tetanus Vaccine  11/27/2023  . DEXA scan (bone density measurement)  Completed  . COVID-19 Vaccine  Completed  .  Hepatitis C: One time screening is recommended by Center for Disease Control  (CDC) for  adults born from [birth year] through 1964.   Completed  . Pneumonia vaccines  Completed

## 2020-10-19 NOTE — Progress Notes (Signed)
Right knee pain - will get xray since had significant pain for 2 weeks that prevented her from her regular exercise routine though it is starting to feel some better.  She has already had a knee replacement on the left side so suspect some element of moderate to severe osteoarthritis.. Will f/u as needed.     Cyst along right jaw line - will get [place] for further workup.  Has been there for few years but she is just not sure if it is changing or not and would like to at least measure a baseline and we can follow it.  It feels like a very cystic structure on exam.

## 2020-10-20 LAB — LIPID PANEL
Cholesterol: 218 mg/dL — ABNORMAL HIGH (ref <200)
HDL: 82 mg/dL (ref >=50)
LDL Cholesterol (Calc): 122 mg/dL (calc) — ABNORMAL HIGH
Non-HDL Cholesterol (Calc): 136 mg/dL (calc) — ABNORMAL HIGH (ref <130)
Total CHOL/HDL Ratio: 2.7 (calc) (ref <5.0)
Triglycerides: 57 mg/dL (ref <150)

## 2020-10-20 LAB — COMPLETE METABOLIC PANEL WITH GFR
AG Ratio: 1.4 (calc) (ref 1.0–2.5)
ALT: 19 U/L (ref 6–29)
AST: 26 U/L (ref 10–35)
Albumin: 4.3 g/dL (ref 3.6–5.1)
Alkaline phosphatase (APISO): 66 U/L (ref 37–153)
BUN: 13 mg/dL (ref 7–25)
CO2: 31 mmol/L (ref 20–32)
Calcium: 10 mg/dL (ref 8.6–10.4)
Chloride: 108 mmol/L (ref 98–110)
Creat: 0.71 mg/dL (ref 0.50–0.99)
GFR, Est African American: 101 mL/min/{1.73_m2} (ref >=60)
GFR, Est Non African American: 88 mL/min/{1.73_m2} (ref >=60)
Globulin: 3 g/dL (calc) (ref 1.9–3.7)
Glucose, Bld: 79 mg/dL (ref 65–99)
Potassium: 4.7 mmol/L (ref 3.5–5.3)
Sodium: 144 mmol/L (ref 135–146)
Total Bilirubin: 0.6 mg/dL (ref 0.2–1.2)
Total Protein: 7.3 g/dL (ref 6.1–8.1)

## 2020-10-20 LAB — CBC
HCT: 43.2 % (ref 35.0–45.0)
Hemoglobin: 14 g/dL (ref 11.7–15.5)
MCH: 29.3 pg (ref 27.0–33.0)
MCHC: 32.4 g/dL (ref 32.0–36.0)
MCV: 90.4 fL (ref 80.0–100.0)
Platelets: 91 10*3/uL — ABNORMAL LOW (ref 140–400)
RBC: 4.78 10*6/uL (ref 3.80–5.10)
RDW: 12.2 % (ref 11.0–15.0)
WBC: 4.5 10*3/uL (ref 3.8–10.8)

## 2020-10-23 ENCOUNTER — Other Ambulatory Visit: Payer: Self-pay

## 2020-10-23 ENCOUNTER — Ambulatory Visit (INDEPENDENT_AMBULATORY_CARE_PROVIDER_SITE_OTHER): Payer: Medicare Other

## 2020-10-23 DIAGNOSIS — R221 Localized swelling, mass and lump, neck: Secondary | ICD-10-CM | POA: Diagnosis not present

## 2020-10-23 DIAGNOSIS — K118 Other diseases of salivary glands: Secondary | ICD-10-CM | POA: Diagnosis not present

## 2020-10-23 NOTE — Progress Notes (Unsigned)
Referral placed for PT

## 2020-10-24 ENCOUNTER — Other Ambulatory Visit: Payer: Self-pay | Admitting: Family Medicine

## 2020-10-24 DIAGNOSIS — D696 Thrombocytopenia, unspecified: Secondary | ICD-10-CM

## 2020-10-24 DIAGNOSIS — R221 Localized swelling, mass and lump, neck: Secondary | ICD-10-CM

## 2020-10-24 DIAGNOSIS — E010 Iodine-deficiency related diffuse (endemic) goiter: Secondary | ICD-10-CM

## 2020-10-24 NOTE — Progress Notes (Signed)
Orders signed.

## 2020-10-24 NOTE — Progress Notes (Signed)
Referrals pended based on labs.. can you please select a diagnosis to go with hematology referral?

## 2020-10-25 ENCOUNTER — Other Ambulatory Visit: Payer: Self-pay

## 2020-10-25 ENCOUNTER — Telehealth: Payer: Self-pay | Admitting: Hematology & Oncology

## 2020-10-25 ENCOUNTER — Ambulatory Visit (INDEPENDENT_AMBULATORY_CARE_PROVIDER_SITE_OTHER): Payer: Medicare Other | Admitting: Sports Medicine

## 2020-10-25 ENCOUNTER — Ambulatory Visit (INDEPENDENT_AMBULATORY_CARE_PROVIDER_SITE_OTHER): Payer: Medicare Other

## 2020-10-25 DIAGNOSIS — M1712 Unilateral primary osteoarthritis, left knee: Secondary | ICD-10-CM | POA: Diagnosis not present

## 2020-10-25 DIAGNOSIS — M1711 Unilateral primary osteoarthritis, right knee: Secondary | ICD-10-CM

## 2020-10-25 NOTE — Assessment & Plan Note (Signed)
History of left knee arthroplasty, arthritis known in the right knee, recent misstep, increasing pain and swelling. Has tried arthritis from Tylenol without improvement, aspiration and injection today, return to see me in a month.

## 2020-10-25 NOTE — Telephone Encounter (Signed)
Received a new hem referral from dr. Linford Arnold for thrombocytopenia. Margaret Smith has been cld and scheduled to see Dr. Myna Hidalgo on 12/8 at 11am w/labs at 1045am. Pt aware to arrive 15 minutes early.

## 2020-10-25 NOTE — Progress Notes (Signed)
    Procedures performed today:    Procedure: Real-time Ultrasound Guided aspiration/injection of right knee Device: Samsung HS60  Verbal informed consent obtained.  Time-out conducted.  Noted no overlying erythema, induration, or other signs of local infection.  Skin prepped in a sterile fashion.  Local anesthesia: Topical Ethyl chloride.  With sterile technique and under real time ultrasound guidance:  Using 18-gauge needle aspirated approximately 30 mL of clear, straw-colored fluid, syringe switched and 1 cc Kenalog 40, 2 cc lidocaine, 2 cc bupivacaine injected easily Completed without difficulty  Advised to call if fevers/chills, erythema, induration, drainage, or persistent bleeding.  Images permanently stored and available for review in PACS.  Impression: Technically successful ultrasound guided injection.  Independent interpretation of notes and tests performed by another provider:   X-rays personally reviewed, nothing acute, just severe osteoarthritis.  Brief History, Exam, Impression, and Recommendations:    Primary osteoarthritis of right knee, post left knee arthroplasty History of left knee arthroplasty, arthritis known in the right knee, recent misstep, increasing pain and swelling. Has tried arthritis from Tylenol without improvement, aspiration and injection today, return to see me in a month.    ___________________________________________ Ihor Austin. Benjamin Stain, M.D., ABFM., CAQSM. Primary Care and Sports Medicine [facility]  Adjunct Instructor of Family Medicine  [employer]

## 2020-11-15 ENCOUNTER — Encounter: Payer: Self-pay | Admitting: Hematology & Oncology

## 2020-11-15 ENCOUNTER — Inpatient Hospital Stay: Payer: Medicare Other | Attending: Hematology & Oncology | Admitting: Hematology & Oncology

## 2020-11-15 ENCOUNTER — Other Ambulatory Visit: Payer: Self-pay

## 2020-11-15 ENCOUNTER — Inpatient Hospital Stay: Payer: Medicare Other

## 2020-11-15 VITALS — BP 160/56 | HR 49 | Temp 98.4°F | Resp 18 | Ht 63.0 in | Wt 156.0 lb

## 2020-11-15 DIAGNOSIS — D696 Thrombocytopenia, unspecified: Secondary | ICD-10-CM

## 2020-11-15 DIAGNOSIS — Z803 Family history of malignant neoplasm of breast: Secondary | ICD-10-CM | POA: Insufficient documentation

## 2020-11-15 DIAGNOSIS — Z87891 Personal history of nicotine dependence: Secondary | ICD-10-CM | POA: Insufficient documentation

## 2020-11-15 DIAGNOSIS — Z800 Family history of malignant neoplasm of digestive organs: Secondary | ICD-10-CM | POA: Insufficient documentation

## 2020-11-15 HISTORY — DX: Thrombocytopenia, unspecified: D69.6

## 2020-11-15 LAB — CBC WITH DIFFERENTIAL (CANCER CENTER ONLY)
Abs Immature Granulocytes: 0.01 10*3/uL (ref 0.00–0.07)
Basophils Absolute: 0 10*3/uL (ref 0.0–0.1)
Basophils Relative: 1 %
Eosinophils Absolute: 0 10*3/uL (ref 0.0–0.5)
Eosinophils Relative: 1 %
HCT: 39.9 % (ref 36.0–46.0)
Hemoglobin: 12.7 g/dL (ref 12.0–15.0)
Immature Granulocytes: 0 %
Lymphocytes Relative: 35 %
Lymphs Abs: 1.4 10*3/uL (ref 0.7–4.0)
MCH: 29.3 pg (ref 26.0–34.0)
MCHC: 31.8 g/dL (ref 30.0–36.0)
MCV: 91.9 fL (ref 80.0–100.0)
Monocytes Absolute: 0.4 10*3/uL (ref 0.1–1.0)
Monocytes Relative: 10 %
Neutro Abs: 2.1 10*3/uL (ref 1.7–7.7)
Neutrophils Relative %: 53 %
Platelet Count: 92 10*3/uL — ABNORMAL LOW (ref 150–400)
RBC: 4.34 MIL/uL (ref 3.87–5.11)
RDW: 14.8 % (ref 11.5–15.5)
WBC Count: 4 10*3/uL (ref 4.0–10.5)
nRBC: 0 % (ref 0.0–0.2)

## 2020-11-15 LAB — CMP (CANCER CENTER ONLY)
ALT: 17 U/L (ref 0–44)
AST: 24 U/L (ref 15–41)
Albumin: 4 g/dL (ref 3.5–5.0)
Alkaline Phosphatase: 53 U/L (ref 38–126)
Anion gap: 6 (ref 5–15)
BUN: 19 mg/dL (ref 8–23)
CO2: 31 mmol/L (ref 22–32)
Calcium: 9.8 mg/dL (ref 8.9–10.3)
Chloride: 106 mmol/L (ref 98–111)
Creatinine: 0.76 mg/dL (ref 0.44–1.00)
GFR, Estimated: >60 mL/min (ref >60)
Glucose, Bld: 90 mg/dL (ref 70–99)
Potassium: 4 mmol/L (ref 3.5–5.1)
Sodium: 143 mmol/L (ref 135–145)
Total Bilirubin: 0.5 mg/dL (ref 0.3–1.2)
Total Protein: 6.8 g/dL (ref 6.5–8.1)

## 2020-11-15 LAB — VITAMIN B12: Vitamin B-12: 285 pg/mL (ref 180–914)

## 2020-11-15 LAB — PLATELET BY CITRATE

## 2020-11-15 LAB — SAVE SMEAR(SSMR), FOR PROVIDER SLIDE REVIEW

## 2020-11-15 LAB — LACTATE DEHYDROGENASE: LDH: 186 U/L (ref 98–192)

## 2020-11-15 NOTE — Progress Notes (Signed)
Referral MD  Reason for Referral: Thrombocytopenia-chronic  Chief Complaint  Patient presents with  . Follow-up  : My  platelets are low.  HPI: Ms. Weisbecker is a very nice 69 year old African-American female.  She is originally from I think [place].  She is now retired.  She is to work for the [employer].  She has had no problems with bleeding or bruising.  Back in 2016, she had knee surgery.  At that time, she has some lab work done by her doctor.  She was found to have some thrombocytopenia.  At that time, her platelet count was I think 81,000.  Prior to this, the last labs I saw her back in 2009.  At that time, her platelet count was 142,000.  I have no platelet counts until recently.  In November, her platelet count was 91,000.  She never had problems with leukopenia or anemia.  There is been no problem with medications.  She really does not take much in the way of medications.  She actually had fluid taken out of her right knee 2 weeks ago.  There is no blood in the fluid.  She has had no rashes.  She has had no weight loss or weight gain.  She is not a vegetarian.  She used to smoke.  She stopped a few years ago.  She does not drink alcohol.  There is no risk factors for HIV or Hepatitis.  She never had any kind of transfusion.  There is no history of blood problems in the family.  She has had no change in bowel or bladder habits.  She is up-to-date with her mammogram and colonoscopy.  Currently, I would say her performance status is ECOG 0.    Past Medical History:  Diagnosis Date  . Hypertension   . Pneumonia   :  Past Surgical History:  Procedure Laterality Date  . CARPAL TUNNEL RELEASE  1980s   bilateral.   . LIPOMA EXCISION     left forearm.   . REPLACEMENT TOTAL KNEE Left    Dr. Pill  :   Current Outpatient Medications:  .  amoxicillin (AMOXIL) 500 MG capsule, Take 1,000 mg by mouth 2 (two) times daily. Before dental procedures.,  Disp: , Rfl: :  :  No Known Allergies:  Family History  Problem Relation Age of Onset  . Hypertension Father   . Diabetes Father   . Pancreatic cancer Father   . Hypertension Mother   . Hyperlipidemia Mother   . Diabetes Mother   . Heart attack Mother   . Stroke Mother   . Seizures Mother   . Hypertension Sister   . Hypertension Brother   . Diabetes Maternal Grandfather   . Breast cancer Paternal Aunt   . Kidney disease Sister   :  Social History   Socioeconomic History  . Marital status: Single    Spouse name: Not on file  . Number of children: Not on file  . Years of education: Not on file  . Highest education level: Not on file  Occupational History  . Occupation: Attention officer    Comment: [employer]  Tobacco Use  . Smoking status: Former Smoker    Quit date: 04/08/2004    Years since quitting: 16.6  . Smokeless tobacco: Never Used  Substance and Sexual Activity  . Alcohol use: No  . Drug use: No  . Sexual activity: Never  Other Topics Concern  . Not on file  Social  History Narrative   Walks for exercise 5-7  days per week for 45 min. she lives and takes care of her mother.   Social Determinants of Health   Financial Resource Strain:   . Difficulty of Paying Living Expenses: Not on file  Food Insecurity:   . Worried About Charity fundraiser in the Last Year: Not on file  . Ran Out of Food in the Last Year: Not on file  Transportation Needs:   . Lack of Transportation (Medical): Not on file  . Lack of Transportation (Non-Medical): Not on file  Physical Activity:   . Days of Exercise per Week: Not on file  . Minutes of Exercise per Session: Not on file  Stress:   . Feeling of Stress : Not on file  Social Connections:   . Frequency of Communication with Friends and Family: Not on file  . Frequency of Social Gatherings with Friends and Family: Not on file  . Attends Religious Services: Not on file  . Active Member of Clubs or Organizations:  Not on file  . Attends Archivist Meetings: Not on file  . Marital Status: Not on file  Intimate Partner Violence:   . Fear of Current or Ex-Partner: Not on file  . Emotionally Abused: Not on file  . Physically Abused: Not on file  . Sexually Abused: Not on file  :  Review of Systems  Constitutional: Negative.   HENT: Negative.   Eyes: Negative.   Respiratory: Negative.   Cardiovascular: Negative.   Gastrointestinal: Negative.   Genitourinary: Negative.   Musculoskeletal: Negative.   Skin: Negative.   Neurological: Negative.   Endo/Heme/Allergies: Negative.   Psychiatric/Behavioral: Negative.      Exam:      This is a well-developed and well-nourished African-American female in no obvious distress.  Vital signs show a temperature of 98.4.  Pulse 49.  Blood pressure 160/56.  Weight is 156 pounds.  Head and neck exam shows no scleral icterus.  There is no oral lesions.  She has no adenopathy in the neck.  Thyroid is not palpable.  Lungs are clear bilaterally.  Cardiac exam regular rate and rhythm with no murmurs, rubs or bruits.  Abdomen is soft.  She has good bowel sounds.  There is no fluid wave.  There is no palpable liver or spleen tip.  Back exam shows no tenderness over the spine, ribs or hips.  Extremities shows no clubbing, cyanosis or edema.  Skin exam shows no rashes, ecchymoses or petechia.  Neurological exam shows no focal neurological deficits.     @IPVITALS @   Recent Labs    11/15/20 1108  WBC 4.0  HGB 12.7  HCT 39.9  PLT 92*   Recent Labs    11/15/20 1108  NA 143  K 4.0  CL 106  CO2 31  GLUCOSE 90  BUN 19  CREATININE 0.76  CALCIUM 9.8    Blood smear review: Normochromic and normocytic population of red blood cells.  There are no nucleated red blood cells.  I see no teardrop cells.  There is no schistocytes or spherocytes.  I see no target cells.  White blood cells appear normal in morphology and maturation.  There is no hypersegmented  polys.  There is no immature myeloid or lymphoid forms.  Platelets are slightly decreased in number.  She has quite a few large platelets that are well granulated.  Pathology: None    Assessment and Plan: Ms. Hattabaugh is a charming  69 year old African-American female.  She has mild thrombocytopenia.  This is been going on for at least 5 years.  I do suspect that this is going to be a mild case of immune thrombocytopenia.  I think the blood smear is suggestive of this with the large platelets.  She is totally asymptomatic.  At the current level of 92,000, I really don't think that thrombocytopenia will be an issue for her.  I do not see that she needs a bone marrow biopsy.  She has not anemic or leukopenic.  Again her blood smear looks consistent with immune-based thrombocytopenia.  I don't see that we have to do any scans on her.  I cannot find anything on exam that is suggestive of an underlying lymphoproliferative process.  For right now, I think we can just follow this along.  I think we get her through the holiday season in the winter.  I would like to see her back in the spring time.  She spent in about 45 minutes with this.  She and I had good fellowship.  I gave her a prayer blanket.  She was very thankful for this.

## 2020-11-16 DIAGNOSIS — H6123 Impacted cerumen, bilateral: Secondary | ICD-10-CM | POA: Diagnosis not present

## 2020-11-16 DIAGNOSIS — H900 Conductive hearing loss, bilateral: Secondary | ICD-10-CM | POA: Diagnosis not present

## 2020-11-16 DIAGNOSIS — K118 Other diseases of salivary glands: Secondary | ICD-10-CM | POA: Diagnosis not present

## 2020-11-22 ENCOUNTER — Ambulatory Visit (INDEPENDENT_AMBULATORY_CARE_PROVIDER_SITE_OTHER): Payer: Medicare Other | Admitting: Sports Medicine

## 2020-11-22 DIAGNOSIS — M1711 Unilateral primary osteoarthritis, right knee: Secondary | ICD-10-CM

## 2020-11-22 NOTE — Progress Notes (Signed)
    Procedures performed today:    None.  Independent interpretation of notes and tests performed by another provider:   None.  Brief History, Exam, Impression, and Recommendations:    Primary osteoarthritis of right knee, post left knee arthroplasty History of a left knee arthroplasty, right knee osteoarthritis, aspirated and injected the last visit, doing excellent today, return as needed.    ___________________________________________ Ihor Austin. Benjamin Stain, M.D., ABFM., CAQSM. Primary Care and Sports Medicine [facility]  Adjunct Instructor of Family Medicine  [employer]

## 2020-11-22 NOTE — Assessment & Plan Note (Addendum)
History of a left knee arthroplasty, right knee osteoarthritis, aspirated and injected the last visit, doing excellent today, return as needed.

## 2020-11-29 ENCOUNTER — Ambulatory Visit (INDEPENDENT_AMBULATORY_CARE_PROVIDER_SITE_OTHER): Payer: Medicare Other

## 2020-11-29 ENCOUNTER — Other Ambulatory Visit: Payer: Self-pay

## 2020-11-29 DIAGNOSIS — Z1231 Encounter for screening mammogram for malignant neoplasm of breast: Secondary | ICD-10-CM

## 2020-12-14 DIAGNOSIS — Z23 Encounter for immunization: Secondary | ICD-10-CM | POA: Diagnosis not present

## 2021-02-15 DIAGNOSIS — K118 Other diseases of salivary glands: Secondary | ICD-10-CM | POA: Diagnosis not present

## 2021-02-15 DIAGNOSIS — H60543 Acute eczematoid otitis externa, bilateral: Secondary | ICD-10-CM | POA: Diagnosis not present

## 2021-03-02 ENCOUNTER — Inpatient Hospital Stay: Payer: Medicare Other | Attending: Hematology & Oncology

## 2021-03-02 ENCOUNTER — Encounter: Payer: Self-pay | Admitting: Hematology & Oncology

## 2021-03-02 ENCOUNTER — Other Ambulatory Visit: Payer: Self-pay

## 2021-03-02 ENCOUNTER — Telehealth: Payer: Self-pay

## 2021-03-02 ENCOUNTER — Inpatient Hospital Stay (HOSPITAL_BASED_OUTPATIENT_CLINIC_OR_DEPARTMENT_OTHER): Payer: Medicare Other | Admitting: Hematology & Oncology

## 2021-03-02 VITALS — BP 147/49 | HR 45 | Temp 98.1°F | Resp 16 | Wt 161.0 lb

## 2021-03-02 DIAGNOSIS — D696 Thrombocytopenia, unspecified: Secondary | ICD-10-CM

## 2021-03-02 LAB — CBC WITH DIFFERENTIAL (CANCER CENTER ONLY)
Abs Immature Granulocytes: 0.01 10*3/uL (ref 0.00–0.07)
Basophils Absolute: 0 10*3/uL (ref 0.0–0.1)
Basophils Relative: 1 %
Eosinophils Absolute: 0.1 10*3/uL (ref 0.0–0.5)
Eosinophils Relative: 2 %
HCT: 41 % (ref 36.0–46.0)
Hemoglobin: 13.4 g/dL (ref 12.0–15.0)
Immature Granulocytes: 0 %
Lymphocytes Relative: 36 %
Lymphs Abs: 1.8 10*3/uL (ref 0.7–4.0)
MCH: 29.5 pg (ref 26.0–34.0)
MCHC: 32.7 g/dL (ref 30.0–36.0)
MCV: 90.1 fL (ref 80.0–100.0)
Monocytes Absolute: 0.6 10*3/uL (ref 0.1–1.0)
Monocytes Relative: 12 %
Neutro Abs: 2.4 10*3/uL (ref 1.7–7.7)
Neutrophils Relative %: 49 %
Platelet Count: 84 10*3/uL — ABNORMAL LOW (ref 150–400)
RBC: 4.55 MIL/uL (ref 3.87–5.11)
RDW: 13.5 % (ref 11.5–15.5)
WBC Count: 5 10*3/uL (ref 4.0–10.5)
nRBC: 0 % (ref 0.0–0.2)

## 2021-03-02 LAB — CMP (CANCER CENTER ONLY)
ALT: 17 U/L (ref 0–44)
AST: 26 U/L (ref 15–41)
Albumin: 4.1 g/dL (ref 3.5–5.0)
Alkaline Phosphatase: 57 U/L (ref 38–126)
Anion gap: 5 (ref 5–15)
BUN: 11 mg/dL (ref 8–23)
CO2: 31 mmol/L (ref 22–32)
Calcium: 9.7 mg/dL (ref 8.9–10.3)
Chloride: 103 mmol/L (ref 98–111)
Creatinine: 0.69 mg/dL (ref 0.44–1.00)
GFR, Estimated: >60 mL/min (ref >60)
Glucose, Bld: 86 mg/dL (ref 70–99)
Potassium: 4.1 mmol/L (ref 3.5–5.1)
Sodium: 139 mmol/L (ref 135–145)
Total Bilirubin: 0.4 mg/dL (ref 0.3–1.2)
Total Protein: 7.1 g/dL (ref 6.5–8.1)

## 2021-03-02 LAB — PLATELET BY CITRATE

## 2021-03-02 NOTE — Progress Notes (Signed)
Hematology and Oncology Follow Up Visit  Margaret Smith [id number] [birth year] 70 y.o. 03/02/2021   Principle Diagnosis:   Thrombocytopenia-likely mild ITP  Current Therapy:    Observation     Interim History:  Margaret Smith is back for second office visit.  We first saw her back in December.  She is doing well.  She has had no problems with bleeding or bruising.  She had a good Christmas and New Year's holiday.  She was with her family.  Her birthday is coming up in a couple weeks.  She has she has unopened Christmas presents that she will open for her birthday.  She has had no issues with fever.  There is no problems with COVID.  She has had no issues with nausea or vomiting.  There is no weight loss.  Her appetite is good.  She has had no change in bowel or bladder habits.  Overall, her performance status is ECOG 0.  Medications:  Current Outpatient Medications:  .  amoxicillin (AMOXIL) 500 MG capsule, Take 1,000 mg by mouth 2 (two) times daily. Before dental procedures., Disp: , Rfl:   Allergies: No Known Allergies  Past Medical History, Surgical history, Social history, and Family History were reviewed and updated.  Review of Systems: Review of Systems  Constitutional: Negative.   HENT:  Negative.   Eyes: Negative.   Respiratory: Negative.   Cardiovascular: Negative.   Gastrointestinal: Negative.   Endocrine: Negative.   Genitourinary: Negative.    Musculoskeletal: Negative.   Skin: Negative.   Neurological: Negative.   Hematological: Negative.   Psychiatric/Behavioral: Negative.     Physical Exam:  weight is 161 lb (73 kg). Her oral temperature is 98.1 F (36.7 C). Her blood pressure is 147/49 (abnormal) and her pulse is 45 (abnormal). Her respiration is 16 and oxygen saturation is 100%.   Wt Readings from Last 3 Encounters:  03/02/21 161 lb (73 kg)  11/15/20 156 lb (70.8 kg)  10/19/20 150 lb (68 kg)    Physical Exam Vitals reviewed.  HENT:     Head:  Normocephalic and atraumatic.  Eyes:     Pupils: Pupils are equal, round, and reactive to light.  Cardiovascular:     Rate and Rhythm: Normal rate and regular rhythm.     Heart sounds: Normal heart sounds.  Pulmonary:     Effort: Pulmonary effort is normal.     Breath sounds: Normal breath sounds.  Abdominal:     General: Bowel sounds are normal.     Palpations: Abdomen is soft.  Musculoskeletal:        General: No tenderness or deformity. Normal range of motion.     Cervical back: Normal range of motion.  Lymphadenopathy:     Cervical: No cervical adenopathy.  Skin:    General: Skin is warm and dry.     Findings: No erythema or rash.  Neurological:     Mental Status: She is alert and oriented to person, place, and time.  Psychiatric:        Behavior: Behavior normal.        Thought Content: Thought content normal.        Judgment: Judgment normal.      Lab Results  Component Value Date   WBC 5.0 03/02/2021   HGB 13.4 03/02/2021   HCT 41.0 03/02/2021   MCV 90.1 03/02/2021   PLT 84 (L) 03/02/2021     Chemistry      Component Value Date/Time   NA  139 03/02/2021 0952   K 4.1 03/02/2021 0952   CL 103 03/02/2021 0952   CO2 31 03/02/2021 0952   BUN 11 03/02/2021 0952   CREATININE 0.69 03/02/2021 0952   CREATININE 0.71 10/19/2020 0000   GLU 85 04/12/2015 0000      Component Value Date/Time   CALCIUM 9.7 03/02/2021 0952   ALKPHOS 57 03/02/2021 0952   AST 26 03/02/2021 0952   ALT 17 03/02/2021 0952   BILITOT 0.4 03/02/2021 0952      Impression and Plan: Margaret Smith is a very nice 70 year old Afro-American female.  She is quite funny.  She used to work for the [employer].  I have to believe that she has a mild case of ITP.  Her platelet count is holding relatively stable.  What is important for me is that her white cells have not changed.  She has a normal white cell differential.  She is not anemic.  I still say we just follow her along.  I still  think we can get her back in 3 months.  I do not see need for a bone marrow biopsy on her.   Volanda Napoleon, MD 3/25/202211:00 AM

## 2021-03-02 NOTE — Telephone Encounter (Signed)
appts made and printed for pt per 03/02/21 los  Margaret Smith

## 2021-05-25 ENCOUNTER — Inpatient Hospital Stay (HOSPITAL_BASED_OUTPATIENT_CLINIC_OR_DEPARTMENT_OTHER): Payer: Medicare Other | Admitting: Hematology & Oncology

## 2021-05-25 ENCOUNTER — Inpatient Hospital Stay: Payer: Medicare Other | Attending: Hematology & Oncology

## 2021-05-25 ENCOUNTER — Encounter: Payer: Self-pay | Admitting: Hematology & Oncology

## 2021-05-25 ENCOUNTER — Other Ambulatory Visit: Payer: Self-pay

## 2021-05-25 VITALS — BP 151/57 | HR 47 | Temp 98.2°F | Resp 17 | Wt 166.0 lb

## 2021-05-25 DIAGNOSIS — D696 Thrombocytopenia, unspecified: Secondary | ICD-10-CM

## 2021-05-25 LAB — CBC WITH DIFFERENTIAL (CANCER CENTER ONLY)
Abs Immature Granulocytes: 0.01 10*3/uL (ref 0.00–0.07)
Basophils Absolute: 0 10*3/uL (ref 0.0–0.1)
Basophils Relative: 1 %
Eosinophils Absolute: 0 10*3/uL (ref 0.0–0.5)
Eosinophils Relative: 1 %
HCT: 39.6 % (ref 36.0–46.0)
Hemoglobin: 13 g/dL (ref 12.0–15.0)
Immature Granulocytes: 0 %
Lymphocytes Relative: 40 %
Lymphs Abs: 2 10*3/uL (ref 0.7–4.0)
MCH: 29.4 pg (ref 26.0–34.0)
MCHC: 32.8 g/dL (ref 30.0–36.0)
MCV: 89.6 fL (ref 80.0–100.0)
Monocytes Absolute: 0.5 10*3/uL (ref 0.1–1.0)
Monocytes Relative: 10 %
Neutro Abs: 2.5 10*3/uL (ref 1.7–7.7)
Neutrophils Relative %: 48 %
Platelet Count: 97 10*3/uL — ABNORMAL LOW (ref 150–400)
RBC: 4.42 MIL/uL (ref 3.87–5.11)
RDW: 13.9 % (ref 11.5–15.5)
WBC Count: 5.1 10*3/uL (ref 4.0–10.5)
nRBC: 0 % (ref 0.0–0.2)

## 2021-05-25 LAB — PLATELET BY CITRATE

## 2021-05-25 LAB — SAVE SMEAR(SSMR), FOR PROVIDER SLIDE REVIEW

## 2021-05-25 NOTE — Progress Notes (Signed)
Hematology and Oncology Follow Up Visit  Margaret Smith [id number] May 07, 1951 70 y.o. 05/25/2021   Principle Diagnosis:  Thrombocytopenia-likely mild ITP  Current Therapy:   Observation     Interim History:  Margaret Smith is back for her follow-up.  Everything is going quite well with her.  She has been walking 5 miles a day.  She is staying very athletic.  She has had no problems with bruising or bleeding.  She has had no problems with nausea or vomiting.  There is been no issues with COVID.  She is still being very cautious with COVID.  There is been no rashes.  She has had no issues with leg swelling.  Overall, her performance status is ECOG 1.   Medications:  Current Outpatient Medications:    amoxicillin (AMOXIL) 500 MG capsule, Take 1,000 mg by mouth 2 (two) times daily. Before dental procedures., Disp: , Rfl:    SHINGRIX injection, , Disp: , Rfl:   Allergies: No Known Allergies  Past Medical History, Surgical history, Social history, and Family History were reviewed and updated.  Review of Systems: Review of Systems  Constitutional: Negative.   HENT:  Negative.    Eyes: Negative.   Respiratory: Negative.    Cardiovascular: Negative.   Gastrointestinal: Negative.   Endocrine: Negative.   Genitourinary: Negative.    Musculoskeletal: Negative.   Skin: Negative.   Neurological: Negative.   Hematological: Negative.   Psychiatric/Behavioral: Negative.     Physical Exam:  weight is 166 lb (75.3 kg). Her oral temperature is 98.2 F (36.8 C). Her blood pressure is 151/57 (abnormal) and her pulse is 47 (abnormal). Her respiration is 17 and oxygen saturation is 98%.   Wt Readings from Last 3 Encounters:  05/25/21 166 lb (75.3 kg)  03/02/21 161 lb (73 kg)  11/15/20 156 lb (70.8 kg)    Physical Exam Vitals reviewed.  HENT:     Head: Normocephalic and atraumatic.  Eyes:     Pupils: Pupils are equal, round, and reactive to light.  Cardiovascular:     Rate and  Rhythm: Normal rate and regular rhythm.     Heart sounds: Normal heart sounds.  Pulmonary:     Effort: Pulmonary effort is normal.     Breath sounds: Normal breath sounds.  Abdominal:     General: Bowel sounds are normal.     Palpations: Abdomen is soft.  Musculoskeletal:        General: No tenderness or deformity. Normal range of motion.     Cervical back: Normal range of motion.  Lymphadenopathy:     Cervical: No cervical adenopathy.  Skin:    General: Skin is warm and dry.     Findings: No erythema or rash.  Neurological:     Mental Status: She is alert and oriented to person, place, and time.  Psychiatric:        Behavior: Behavior normal.        Thought Content: Thought content normal.        Judgment: Judgment normal.     Lab Results  Component Value Date   WBC 5.1 05/25/2021   HGB 13.0 05/25/2021   HCT 39.6 05/25/2021   MCV 89.6 05/25/2021   PLT 97 (L) 05/25/2021     Chemistry      Component Value Date/Time   NA 139 03/02/2021 0952   K 4.1 03/02/2021 0952   CL 103 03/02/2021 0952   CO2 31 03/02/2021 0952   BUN 11 03/02/2021 9357  CREATININE 0.69 03/02/2021 0952   CREATININE 0.71 10/19/2020 0000   GLU 85 04/12/2015 0000      Component Value Date/Time   CALCIUM 9.7 03/02/2021 0952   ALKPHOS 57 03/02/2021 0952   AST 26 03/02/2021 0952   ALT 17 03/02/2021 0952   BILITOT 0.4 03/02/2021 0952      Impression and Plan: Margaret Smith is a very nice 70 year old Afro-American female.  She is quite funny.  She used to work for the [employer].  I am happy that her platelet count is doing little bit better.  Again, I suspect that we are looking at a mild form of ITP.  For right now, we will plan to see her back in 6 months.     Josph Macho, MD 6/17/202211:55 AM

## 2021-07-09 DIAGNOSIS — Z23 Encounter for immunization: Secondary | ICD-10-CM | POA: Diagnosis not present

## 2021-09-20 DIAGNOSIS — K118 Other diseases of salivary glands: Secondary | ICD-10-CM | POA: Diagnosis not present

## 2021-10-15 DIAGNOSIS — Z23 Encounter for immunization: Secondary | ICD-10-CM | POA: Diagnosis not present

## 2021-10-23 ENCOUNTER — Other Ambulatory Visit: Payer: Self-pay

## 2021-10-23 ENCOUNTER — Encounter: Payer: Self-pay | Admitting: Family Medicine

## 2021-10-23 ENCOUNTER — Other Ambulatory Visit: Payer: Self-pay | Admitting: Family Medicine

## 2021-10-23 ENCOUNTER — Ambulatory Visit (INDEPENDENT_AMBULATORY_CARE_PROVIDER_SITE_OTHER): Payer: Medicare Other | Admitting: Family Medicine

## 2021-10-23 VITALS — BP 147/68 | HR 47 | Ht 63.0 in | Wt 169.0 lb

## 2021-10-23 DIAGNOSIS — Z0000 Encounter for general adult medical examination without abnormal findings: Secondary | ICD-10-CM

## 2021-10-23 DIAGNOSIS — Z13220 Encounter for screening for lipoid disorders: Secondary | ICD-10-CM | POA: Diagnosis not present

## 2021-10-23 DIAGNOSIS — E010 Iodine-deficiency related diffuse (endemic) goiter: Secondary | ICD-10-CM

## 2021-10-23 DIAGNOSIS — Z1231 Encounter for screening mammogram for malignant neoplasm of breast: Secondary | ICD-10-CM

## 2021-10-23 DIAGNOSIS — D696 Thrombocytopenia, unspecified: Secondary | ICD-10-CM | POA: Diagnosis not present

## 2021-10-23 DIAGNOSIS — E785 Hyperlipidemia, unspecified: Secondary | ICD-10-CM

## 2021-10-23 NOTE — Patient Instructions (Signed)
You can also try melatonin for sleep about 3 mg is a good place to start and then gradually increase if needed. Could also consider a trial of valerian root for sleep.

## 2021-10-23 NOTE — Progress Notes (Signed)
Subjective:   Margaret Smith is a 70 y.o. female who presents for Medicare Annual (Subsequent) preventive examination.  Review of Systems    ROS is negative.         Objective:    Today's Vitals   10/23/21 0938 10/23/21 1123  BP: (!) 176/55 (!) 147/68  Pulse: (!) 46 (!) 47  SpO2: 100%   Weight: 169 lb (76.7 kg)   Height: 5\' 3"  (1.6 m)    Body mass index is 29.94 kg/m.  Advanced Directives 10/23/2021 11/15/2020 12/22/2014  Does Patient Have a Medical Advance Directive? No No No  Would patient like information on creating a medical advance directive? Yes (MAU/Ambulatory/Procedural Areas - Information given) No - Patient declined Yes - Educational materials given    Current Medications (verified) Outpatient Encounter Medications as of 10/23/2021  Medication Sig   [DISCONTINUED] amoxicillin (AMOXIL) 500 MG capsule Take 1,000 mg by mouth 2 (two) times daily. Before dental procedures.   [DISCONTINUED] SHINGRIX injection    No facility-administered encounter medications on file as of 10/23/2021.    Allergies (verified) Patient has no known allergies.   History: Past Medical History:  Diagnosis Date   Hypertension    Pneumonia    Thrombocytopenia (HCC) 11/15/2020   Past Surgical History:  Procedure Laterality Date   CARPAL TUNNEL RELEASE  1980s   bilateral.    LIPOMA EXCISION     left forearm.    REPLACEMENT TOTAL KNEE Left    Dr. Pill   Family History  Problem Relation Age of Onset   Hypertension Father    Diabetes Father    Pancreatic cancer Father    Hypertension Mother    Hyperlipidemia Mother    Diabetes Mother    Heart attack Mother    Stroke Mother    Seizures Mother    Hypertension Sister    Hypertension Brother    Diabetes Maternal Grandfather    Breast cancer Paternal Aunt    Kidney disease Sister    Social History   Socioeconomic History   Marital status: Single    Spouse name: Not on file   Number of children: Not on file   Years of  education: Not on file   Highest education level: Not on file  Occupational History   Occupation: Attention officer    Comment: [employer]  Tobacco Use   Smoking status: Former    Types: Cigarettes    Quit date: 04/08/2004    Years since quitting: 17.5   Smokeless tobacco: Never  Substance and Sexual Activity   Alcohol use: No   Drug use: No   Sexual activity: Never  Other Topics Concern   Not on file  Social History Narrative   Walks for exercise 5-7  days per week for 45 min. she lives and takes care of her mother.   Social Determinants of Health   Financial Resource Strain: Not on file  Food Insecurity: Not on file  Transportation Needs: Not on file  Physical Activity: Not on file  Stress: Not on file  Social Connections: Not on file    Tobacco Counseling Counseling given: Not Answered   Clinical Intake:  She did bring in a list of her home blood pressure numbers which look fantastic.  She walks daily for greater than an hour.  She has been trying to still eat very healthy she still struggling a lot with sleep and really trying to improve her sleep quality.  She sometimes will use tart cherry  juice and ZzzQuil.  She is been tracking it with her Fitbit.  She also showed me a picture of some bruising on her forearms its been coming and going.    Diabetic?No  Activities of Daily Living In your present state of health, do you have any difficulty performing the following activities: 10/23/2021  Hearing? N  Vision? N  Difficulty concentrating or making decisions? N  Walking or climbing stairs? N  Dressing or bathing? N  Doing errands, shopping? N  Some recent data might be hidden    Patient Care Team: Agapito Games, MD as PCP - General (Family Medicine)  Indicate any recent Medical Services you may have received from other than [facility] providers in the past year (date may be approximate).     Assessment:   This is a routine wellness examination for  Margaret Smith.  Hearing/Vision screen No results found.  Dietary issues and exercise activities discussed: Current Exercise Habits: Home exercise routine, Type of exercise: walking, Time (Minutes): > 60, Frequency (Times/Week): 7, Weekly Exercise (Minutes/Week): 0, Intensity: Moderate   Goals Addressed   None   Depression Screen PHQ 2/9 Scores 10/23/2021 10/19/2020 10/12/2019 08/05/2018 08/05/2018 08/05/2018  PHQ - 2 Score 0 0 3 0 0 0  PHQ- 9 Score - - 7 - 0 -    Fall Risk Fall Risk  10/23/2021 10/20/2020 11/01/2019 10/12/2019 08/05/2018  Falls in the past year? 1 0 1 1 -  Comment - - Emmi Telephone Survey: data to providers prior to load - -  Number falls in past yr: 0 - 1 1 -  Comment - - Emmi Telephone Survey Actual Response = 1 - -  Injury with Fall? 0 - 0 1 No  Risk for fall due to : No Fall Risks No Fall Risks - Other (Comment) -  Risk for fall due to: Comment - - - stumbled on uneven pavement -  Follow up Falls prevention discussed;Falls evaluation completed Falls evaluation completed - Falls prevention discussed Education provided    FALL RISK PREVENTION PERTAINING TO THE HOME:  Any stairs in or around the home? No  If so, are there any without handrails? No   ASSISTIVE DEVICES UTILIZED TO PREVENT FALLS:  Life alert? No  Use of a cane, walker or w/c? No  Grab bars in the bathroom? No  Shower chair or bench in shower? No  Elevated toilet seat or a handicapped toilet? No   TIMED UP AND GO:  Was the test performed? No .   Gait steady and fast without use of assistive device  Cognitive Function:     6CIT Screen 10/12/2019 08/05/2018  What Year? 0 points 0 points  What month? 0 points 0 points  What time? 0 points 0 points  Count back from 20 0 points 0 points  Months in reverse 0 points 0 points  Repeat phrase 2 points 0 points  Total Score 2 0    Immunizations Immunization History  Administered Date(s) Administered   Fluad Quad(high Dose 65+) 10/12/2019,  10/19/2020, 09/29/2021   Influenza,inj,Quad PF,6+ Mos 11/18/2014, 12/28/2016   Influenza,inj,quad, With Preservative 09/08/2015   Influenza-Unspecified 10/13/2013, 09/09/2015   Moderna Covid-19 Vaccine Bivalent Booster 28yrs & up 10/15/2021   Moderna Sars-Covid-2 Vaccination 02/17/2020, 03/09/2020, 10/14/2020, 07/09/2021   Pneumococcal Conjugate-13 11/26/2013   Pneumococcal Polysaccharide-23 08/05/2018   Tdap 11/26/2013   Zoster Recombinat (Shingrix) 12/14/2020, 02/28/2021   Zoster, Live 11/18/2014    TDAP status: Up to date  Flu Vaccine status: Up to  date  Pneumococcal vaccine status: Up to date  Covid-19 vaccine status: Completed vaccines  Qualifies for Shingles Vaccine?  Zostavax completed Yes   Shingrix Completed?: Yes  Screening Tests Health Maintenance  Topic Date Due   COLONOSCOPY (Pts 45-47yrs Insurance coverage will need to be confirmed)  09/17/2022   MAMMOGRAM  11/29/2022   TETANUS/TDAP  11/27/2023   Pneumonia Vaccine 68+ Years old  Completed   INFLUENZA VACCINE  Completed   DEXA SCAN  Completed   COVID-19 Vaccine  Completed   Hepatitis C Screening  Completed   Zoster Vaccines- Shingrix  Completed   HPV VACCINES  Aged Out    Health Maintenance  There are no preventive care reminders to display for this patient.  Colorectal cancer screening: Type of screening: Colonoscopy. Completed 2013. Repeat every 10 years  Mammogram status: Completed 2021. Repeat every year  Bone Density status: Completed 2019. Results reflect: Bone density results: NORMAL. Repeat every 5 years.  Lung Cancer Screening: (Low Dose CT Chest recommended if Age 3-80 years, 30 pack-year currently smoking OR have quit w/in 15years.) does not qualify.   Lung Cancer Screening Referral: NA  Additional Screening:  Hepatitis C Screening: does not qualify;   Vision Screening: Recommended annual ophthalmology exams for early detection of glaucoma and other disorders of the eye.  Dental  Screening: Recommended annual dental exams for proper oral hygiene  Community Resource Referral / Chronic Care Management: CRR required this visit?  No   CCM required this visit?  No      Plan:     I have personally reviewed and noted the following in the patient's chart:   Medical and social history Use of alcohol, tobacco or illicit drugs  Current medications and supplements including opioid prescriptions.  Functional ability and status Nutritional status Physical activity Advanced directives List of other physicians Hospitalizations, surgeries, and ER visits in previous 12 months Vitals Screenings to include cognitive, depression, and falls Referrals and appointments Bruising-she has known low platelets that certainly could be a contributor.  Also consider senile purpura.  In addition, I have reviewed and discussed with patient certain preventive protocols, quality metrics, and best practice recommendations. A written personalized care plan for preventive services as well as general preventive health recommendations were provided to patient.     Nani Gasser, MD   10/23/2021

## 2021-10-24 LAB — COMPLETE METABOLIC PANEL WITH GFR
AG Ratio: 1.6 (calc) (ref 1.0–2.5)
ALT: 13 U/L (ref 6–29)
AST: 22 U/L (ref 10–35)
Albumin: 4.2 g/dL (ref 3.6–5.1)
Alkaline phosphatase (APISO): 65 U/L (ref 37–153)
BUN: 21 mg/dL (ref 7–25)
CO2: 29 mmol/L (ref 20–32)
Calcium: 9.4 mg/dL (ref 8.6–10.4)
Chloride: 107 mmol/L (ref 98–110)
Creat: 0.71 mg/dL (ref 0.50–1.05)
Globulin: 2.7 g/dL (calc) (ref 1.9–3.7)
Glucose, Bld: 82 mg/dL (ref 65–99)
Potassium: 4.3 mmol/L (ref 3.5–5.3)
Sodium: 142 mmol/L (ref 135–146)
Total Bilirubin: 0.5 mg/dL (ref 0.2–1.2)
Total Protein: 6.9 g/dL (ref 6.1–8.1)
eGFR: 92 mL/min/{1.73_m2} (ref >=60)

## 2021-10-24 LAB — LIPID PANEL
Cholesterol: 223 mg/dL — ABNORMAL HIGH (ref <200)
HDL: 83 mg/dL (ref >=50)
LDL Cholesterol (Calc): 126 mg/dL (calc) — ABNORMAL HIGH
Non-HDL Cholesterol (Calc): 140 mg/dL (calc) — ABNORMAL HIGH (ref <130)
Total CHOL/HDL Ratio: 2.7 (calc) (ref <5.0)
Triglycerides: 46 mg/dL (ref <150)

## 2021-10-24 LAB — CBC
HCT: 42.6 % (ref 35.0–45.0)
Hemoglobin: 13.9 g/dL (ref 11.7–15.5)
MCH: 29.4 pg (ref 27.0–33.0)
MCHC: 32.6 g/dL (ref 32.0–36.0)
MCV: 90.1 fL (ref 80.0–100.0)
Platelets: 120 10*3/uL — ABNORMAL LOW (ref 140–400)
RBC: 4.73 10*6/uL (ref 3.80–5.10)
RDW: 13.1 % (ref 11.0–15.0)
WBC: 3.8 10*3/uL (ref 3.8–10.8)

## 2021-10-24 LAB — TSH: TSH: 1.82 mIU/L (ref 0.40–4.50)

## 2021-10-24 MED ORDER — ROSUVASTATIN CALCIUM 10 MG PO TABS: 10.0000 mg | ORAL_TABLET | Freq: Every day | ORAL | 3 refills | Status: DC

## 2021-10-24 NOTE — Addendum Note (Signed)
Addended by: Nani Gasser D on: 10/24/2021 02:07 PM   Modules accepted: Orders

## 2021-10-24 NOTE — Progress Notes (Signed)
Call patient: LDL cholesterol is mildly elevated similar to last year.  Just encouraged her to continue to work on healthy food choices and regular exercise.  Your current cardiovascular risk score is 15% meaning you are at risk for a heart attack or stroke in the next 10 years.  Current guidelines recommend treating with a statin, which is a cholesterol-lowering drug if your risk is 10% or higher.  I would really encourage you to consider letting us start the medication to improve your numbers and reduce your risk.  If you are okay with that then please let us know and we can send a prescription to the pharmacy and then recheck everything in 3 months.  Your metabolic panel looks good.  Lites are still low but they are coming up nicely up to 120,000.  Thyroid looks good.  The 10-year ASCVD risk score (Arnett DK, et al., 2019) is: 15.7%   Values used to calculate the score:     Age: 70 years     Sex: Female     Is Non-Hispanic African American: Yes     Diabetic: No     Tobacco smoker: No     Systolic Blood Pressure: 147 mmHg     Is BP treated: No     HDL Cholesterol: 83 mg/dL     Total Cholesterol: 223 mg/dL

## 2021-11-23 ENCOUNTER — Inpatient Hospital Stay (HOSPITAL_BASED_OUTPATIENT_CLINIC_OR_DEPARTMENT_OTHER): Payer: Medicare Other | Admitting: Family

## 2021-11-23 ENCOUNTER — Other Ambulatory Visit: Payer: Self-pay

## 2021-11-23 ENCOUNTER — Telehealth: Payer: Self-pay | Admitting: *Deleted

## 2021-11-23 ENCOUNTER — Inpatient Hospital Stay: Payer: Medicare Other | Attending: Hematology & Oncology

## 2021-11-23 VITALS — BP 184/55 | HR 55 | Temp 98.5°F | Resp 20 | Wt 182.0 lb

## 2021-11-23 DIAGNOSIS — D693 Immune thrombocytopenic purpura: Secondary | ICD-10-CM | POA: Diagnosis not present

## 2021-11-23 DIAGNOSIS — D696 Thrombocytopenia, unspecified: Secondary | ICD-10-CM

## 2021-11-23 DIAGNOSIS — Z79899 Other long term (current) drug therapy: Secondary | ICD-10-CM | POA: Insufficient documentation

## 2021-11-23 LAB — CBC WITH DIFFERENTIAL (CANCER CENTER ONLY)
Abs Immature Granulocytes: 0.01 10*3/uL (ref 0.00–0.07)
Basophils Absolute: 0.1 10*3/uL (ref 0.0–0.1)
Basophils Relative: 1 %
Eosinophils Absolute: 0.1 10*3/uL (ref 0.0–0.5)
Eosinophils Relative: 1 %
HCT: 41.2 % (ref 36.0–46.0)
Hemoglobin: 13.1 g/dL (ref 12.0–15.0)
Immature Granulocytes: 0 %
Lymphocytes Relative: 37 %
Lymphs Abs: 1.9 10*3/uL (ref 0.7–4.0)
MCH: 29 pg (ref 26.0–34.0)
MCHC: 31.8 g/dL (ref 30.0–36.0)
MCV: 91.4 fL (ref 80.0–100.0)
Monocytes Absolute: 0.5 10*3/uL (ref 0.1–1.0)
Monocytes Relative: 10 %
Neutro Abs: 2.6 10*3/uL (ref 1.7–7.7)
Neutrophils Relative %: 51 %
Platelet Count: 103 10*3/uL — ABNORMAL LOW (ref 150–400)
RBC: 4.51 MIL/uL (ref 3.87–5.11)
RDW: 14.4 % (ref 11.5–15.5)
WBC Count: 5.1 10*3/uL (ref 4.0–10.5)
nRBC: 0 % (ref 0.0–0.2)

## 2021-11-23 LAB — CMP (CANCER CENTER ONLY)
ALT: 13 U/L (ref 0–44)
AST: 19 U/L (ref 15–41)
Albumin: 4 g/dL (ref 3.5–5.0)
Alkaline Phosphatase: 67 U/L (ref 38–126)
Anion gap: 6 (ref 5–15)
BUN: 18 mg/dL (ref 8–23)
CO2: 29 mmol/L (ref 22–32)
Calcium: 9.3 mg/dL (ref 8.9–10.3)
Chloride: 105 mmol/L (ref 98–111)
Creatinine: 0.78 mg/dL (ref 0.44–1.00)
GFR, Estimated: >60 mL/min (ref >60)
Glucose, Bld: 85 mg/dL (ref 70–99)
Potassium: 4 mmol/L (ref 3.5–5.1)
Sodium: 140 mmol/L (ref 135–145)
Total Bilirubin: 0.5 mg/dL (ref 0.3–1.2)
Total Protein: 6.9 g/dL (ref 6.5–8.1)

## 2021-11-23 LAB — SAVE SMEAR(SSMR), FOR PROVIDER SLIDE REVIEW

## 2021-11-23 NOTE — Progress Notes (Signed)
°  Hematology and Oncology Follow Up Visit  Margaret Smith [id number] Jul 11, 1951 70 y.o. 11/23/2021   Principle Diagnosis:  Thrombocytopenia-likely mild ITP   Current Therapy:        Observation   Interim History:  Margaret Smith is here today for follow-up. Platelets are stable at 103.  She has not noted any blood loss, no abnormal bruising, no petechiae.  She denies fatigue.  No fever, chills, n/v, cough, rash, dizziness, SOB, chest pain, palpitations, abdominal pain or changes in bowel or bladder habits.  No swelling, tenderness, numbness or tingling in her extremities.  No falls or syncope reported.  She has a good appetite and is staying well hydrated. Her weight is stable at   ECOG Performance Status: 0 - Asymptomatic  Medications:  Allergies as of 11/23/2021   No Known Allergies      Medication List        Accurate as of November 23, 2021 10:55 AM. If you have any questions, ask your nurse or doctor.          rosuvastatin 10 MG tablet Commonly known as: Crestor Take 1 tablet (10 mg total) by mouth at bedtime.        Allergies: No Known Allergies  Past Medical History, Surgical history, Social history, and Family History were reviewed and updated.  Review of Systems: All other 10 point review of systems is negative.   Physical Exam:  vitals were not taken for this visit.   Wt Readings from Last 3 Encounters:  10/23/21 169 lb (76.7 kg)  05/25/21 166 lb (75.3 kg)  03/02/21 161 lb (73 kg)    Ocular: Sclerae unicteric, pupils equal, round and reactive to light Ear-nose-throat: Oropharynx clear, dentition fair Lymphatic: No cervical or supraclavicular adenopathy Lungs no rales or rhonchi, good excursion bilaterally Heart regular rate and rhythm, no murmur appreciated Abd soft, nontender, positive bowel sounds MSK no focal spinal tenderness, no joint edema Neuro: non-focal, well-oriented, appropriate affect Breasts: Deferred   Lab Results   Component Value Date   WBC 5.1 11/23/2021   HGB 13.1 11/23/2021   HCT 41.2 11/23/2021   MCV 91.4 11/23/2021   PLT 103 (L) 11/23/2021   No results found for: FERRITIN, IRON, TIBC, UIBC, IRONPCTSAT Lab Results  Component Value Date   RBC 4.51 11/23/2021   No results found for: KPAFRELGTCHN, LAMBDASER, KAPLAMBRATIO No results found for: IGGSERUM, IGA, IGMSERUM No results found for: Georgann Housekeeper, MSPIKE, SPEI   Chemistry      Component Value Date/Time   NA 140 11/23/2021 1011   K 4.0 11/23/2021 1011   CL 105 11/23/2021 1011   CO2 29 11/23/2021 1011   BUN 18 11/23/2021 1011   CREATININE 0.78 11/23/2021 1011   CREATININE 0.71 10/23/2021 0000   GLU 85 04/12/2015 0000      Component Value Date/Time   CALCIUM 9.3 11/23/2021 1011   ALKPHOS 67 11/23/2021 1011   AST 19 11/23/2021 1011   ALT 13 11/23/2021 1011   BILITOT 0.5 11/23/2021 1011       Impression and Plan: Margaret Smith is a very pleasant 70 yo African American female with history of mild chronic ITP. Her counts remain stable and she is asymptomatic at this time.  Follow-up in 3 months.   Eileen Stanford, NP 12/16/202210:55 AM

## 2021-11-23 NOTE — Telephone Encounter (Signed)
Per 11/23/21 los - gave upcoming appointments - confirmed °

## 2021-12-05 ENCOUNTER — Ambulatory Visit (INDEPENDENT_AMBULATORY_CARE_PROVIDER_SITE_OTHER): Payer: Medicare Other

## 2021-12-05 ENCOUNTER — Other Ambulatory Visit: Payer: Self-pay

## 2021-12-05 DIAGNOSIS — Z1231 Encounter for screening mammogram for malignant neoplasm of breast: Secondary | ICD-10-CM

## 2021-12-06 NOTE — Progress Notes (Signed)
Please call patient. Normal mammogram.  Repeat in 1 year.  

## 2022-02-21 ENCOUNTER — Other Ambulatory Visit: Payer: Self-pay

## 2022-02-21 ENCOUNTER — Inpatient Hospital Stay: Payer: Medicare Other | Attending: Hematology & Oncology

## 2022-02-21 ENCOUNTER — Telehealth: Payer: Self-pay | Admitting: *Deleted

## 2022-02-21 ENCOUNTER — Inpatient Hospital Stay (HOSPITAL_BASED_OUTPATIENT_CLINIC_OR_DEPARTMENT_OTHER): Payer: Medicare Other | Admitting: Family

## 2022-02-21 ENCOUNTER — Encounter: Payer: Self-pay | Admitting: Family

## 2022-02-21 VITALS — BP 114/64 | HR 51 | Temp 98.0°F | Resp 18 | Ht 63.0 in | Wt 186.8 lb

## 2022-02-21 DIAGNOSIS — D696 Thrombocytopenia, unspecified: Secondary | ICD-10-CM | POA: Diagnosis not present

## 2022-02-21 DIAGNOSIS — D693 Immune thrombocytopenic purpura: Secondary | ICD-10-CM | POA: Diagnosis not present

## 2022-02-21 LAB — CBC WITH DIFFERENTIAL (CANCER CENTER ONLY)
Abs Immature Granulocytes: 0 10*3/uL (ref 0.00–0.07)
Basophils Absolute: 0 10*3/uL (ref 0.0–0.1)
Basophils Relative: 1 %
Eosinophils Absolute: 0 10*3/uL (ref 0.0–0.5)
Eosinophils Relative: 1 %
HCT: 43.3 % (ref 36.0–46.0)
Hemoglobin: 13.8 g/dL (ref 12.0–15.0)
Immature Granulocytes: 0 %
Lymphocytes Relative: 39 %
Lymphs Abs: 2.1 10*3/uL (ref 0.7–4.0)
MCH: 28.8 pg (ref 26.0–34.0)
MCHC: 31.9 g/dL (ref 30.0–36.0)
MCV: 90.2 fL (ref 80.0–100.0)
Monocytes Absolute: 0.6 10*3/uL (ref 0.1–1.0)
Monocytes Relative: 10 %
Neutro Abs: 2.7 10*3/uL (ref 1.7–7.7)
Neutrophils Relative %: 49 %
Platelet Count: 99 10*3/uL — ABNORMAL LOW (ref 150–400)
RBC: 4.8 MIL/uL (ref 3.87–5.11)
RDW: 13.8 % (ref 11.5–15.5)
WBC Count: 5.4 10*3/uL (ref 4.0–10.5)
nRBC: 0 % (ref 0.0–0.2)

## 2022-02-21 LAB — CMP (CANCER CENTER ONLY)
ALT: 14 U/L (ref 0–44)
AST: 21 U/L (ref 15–41)
Albumin: 4.2 g/dL (ref 3.5–5.0)
Alkaline Phosphatase: 60 U/L (ref 38–126)
Anion gap: 6 (ref 5–15)
BUN: 22 mg/dL (ref 8–23)
CO2: 30 mmol/L (ref 22–32)
Calcium: 9.7 mg/dL (ref 8.9–10.3)
Chloride: 105 mmol/L (ref 98–111)
Creatinine: 0.86 mg/dL (ref 0.44–1.00)
GFR, Estimated: >60 mL/min (ref >60)
Glucose, Bld: 85 mg/dL (ref 70–99)
Potassium: 4 mmol/L (ref 3.5–5.1)
Sodium: 141 mmol/L (ref 135–145)
Total Bilirubin: 0.5 mg/dL (ref 0.3–1.2)
Total Protein: 7.3 g/dL (ref 6.5–8.1)

## 2022-02-21 LAB — SAVE SMEAR(SSMR), FOR PROVIDER SLIDE REVIEW

## 2022-02-21 NOTE — Progress Notes (Signed)
?  Hematology and Oncology Follow Up Visit ? ?Margaret Smith ?[id number] ?[birth year] 71 y.o. ?02/21/2022 ? ? ?Principle Diagnosis:  ?Thrombocytopenia-likely mild ITP ?  ?Current Therapy:        ?Observation ?  ?Interim History:  Margaret Smith is here today for follow-up. She is doing well and has no complaints at this time.  ?No blood loss, bruising or petechiae noted.  ?Platelets are stable at 99.  ?No fever, chills, n/v, cough, rash, dizziness, SOB, chest pain, palpitations, abdominal pain or changes in bowel or bladder habits.  ?No swelling, tenderness, numbness or tingling in her extremities at this time.  ?She has occasionally puffiness in her ankles. Pedal pulses are 2+.  ?No falls or syncope.  ?She has maintained a good appetite and is staying well hydrated. Her weight is 186 lbs.  ? ?ECOG Performance Status: 0 - Asymptomatic ? ?Medications:  ?Allergies as of 02/21/2022   ?No Known Allergies ?  ? ?  ?Medication List  ?  ? ?  ? Accurate as of February 21, 2022 10:14 AM. If you have any questions, ask your nurse or doctor.  ?  ?  ? ?  ? ?rosuvastatin 10 MG tablet ?Commonly known as: Crestor ?Take 1 tablet (10 mg total) by mouth at bedtime. ?  ? ?  ? ? ?Allergies: No Known Allergies ? ?Past Medical History, Surgical history, Social history, and Family History were reviewed and updated. ? ?Review of Systems: ?All other 10 point review of systems is negative.  ? ?Physical Exam: ? vitals were not taken for this visit.  ? ?Wt Readings from Last 3 Encounters:  ?11/23/21 182 lb (82.6 kg)  ?10/23/21 169 lb (76.7 kg)  ?05/25/21 166 lb (75.3 kg)  ? ? ?Ocular: Sclerae unicteric, pupils equal, round and reactive to light ?Ear-nose-throat: Oropharynx clear, dentition fair ?Lymphatic: No cervical or supraclavicular adenopathy ?Lungs no rales or rhonchi, good excursion bilaterally ?Heart regular rate and rhythm, no murmur appreciated ?Abd soft, nontender, positive bowel sounds ?MSK no focal spinal tenderness, no joint edema ?Neuro:  non-focal, well-oriented, appropriate affect ?Breasts: Deferred  ? ?Lab Results  ?Component Value Date  ? WBC 5.4 02/21/2022  ? HGB 13.8 02/21/2022  ? HCT 43.3 02/21/2022  ? MCV 90.2 02/21/2022  ? PLT 99 (L) 02/21/2022  ? ?No results found for: FERRITIN, IRON, TIBC, UIBC, IRONPCTSAT ?Lab Results  ?Component Value Date  ? RBC 4.80 02/21/2022  ? ?No results found for: KPAFRELGTCHN, LAMBDASER, KAPLAMBRATIO ?No results found for: IGGSERUM, IGA, IGMSERUM ?No results found for: TOTALPROTELP, ALBUMINELP, A1GS, A2GS, BETS, BETA2SER, GAMS, MSPIKE, SPEI ?  Chemistry   ?   ?Component Value Date/Time  ? NA 140 11/23/2021 1011  ? K 4.0 11/23/2021 1011  ? CL 105 11/23/2021 1011  ? CO2 29 11/23/2021 1011  ? BUN 18 11/23/2021 1011  ? CREATININE 0.78 11/23/2021 1011  ? CREATININE 0.71 10/23/2021 0000  ? GLU 85 04/12/2015 0000  ?    ?Component Value Date/Time  ? CALCIUM 9.3 11/23/2021 1011  ? ALKPHOS 67 11/23/2021 1011  ? AST 19 11/23/2021 1011  ? ALT 13 11/23/2021 1011  ? BILITOT 0.5 11/23/2021 1011  ?  ? ? ? ?Impression and Plan: Margaret Smith is a very pleasant 71 yo [place] American female with history of mild chronic ITP.  ?Her platelets count remains stable at 99.  ?Follow-up in 6 months.  ? ?Eileen Stanford, NP ?3/16/202310:14 AM ? ?

## 2022-02-21 NOTE — Telephone Encounter (Signed)
Per 02/21/22 los - gave upcoming appointments - confirmed ?

## 2022-02-22 ENCOUNTER — Inpatient Hospital Stay: Payer: Medicare Other | Admitting: Family

## 2022-02-22 ENCOUNTER — Inpatient Hospital Stay: Payer: Medicare Other

## 2022-06-28 ENCOUNTER — Telehealth: Payer: Self-pay | Admitting: Family Medicine

## 2022-06-28 NOTE — Telephone Encounter (Signed)
Pt called.  She wants to get her colonoscopy scheduled(she is going on a cruise October 19th).

## 2022-07-02 NOTE — Telephone Encounter (Signed)
Called patient back, she has seen [facility] in the past (10 years ago) and should not need another referral. Given phone number to call them directly at [phone]. Will call back with any problems.

## 2022-08-23 ENCOUNTER — Encounter: Payer: Self-pay | Admitting: Hematology & Oncology

## 2022-08-23 ENCOUNTER — Inpatient Hospital Stay (HOSPITAL_BASED_OUTPATIENT_CLINIC_OR_DEPARTMENT_OTHER): Payer: Medicare Other | Admitting: Hematology & Oncology

## 2022-08-23 ENCOUNTER — Inpatient Hospital Stay: Payer: Medicare Other | Attending: Hematology & Oncology

## 2022-08-23 ENCOUNTER — Other Ambulatory Visit: Payer: Self-pay

## 2022-08-23 VITALS — BP 170/63 | HR 50 | Temp 98.0°F | Resp 18 | Ht 63.0 in | Wt 172.0 lb

## 2022-08-23 DIAGNOSIS — D696 Thrombocytopenia, unspecified: Secondary | ICD-10-CM | POA: Diagnosis not present

## 2022-08-23 LAB — SAVE SMEAR(SSMR), FOR PROVIDER SLIDE REVIEW

## 2022-08-23 LAB — CMP (CANCER CENTER ONLY)
ALT: 14 U/L (ref 0–44)
AST: 22 U/L (ref 15–41)
Albumin: 4.3 g/dL (ref 3.5–5.0)
Alkaline Phosphatase: 64 U/L (ref 38–126)
Anion gap: 7 (ref 5–15)
BUN: 15 mg/dL (ref 8–23)
CO2: 30 mmol/L (ref 22–32)
Calcium: 9.8 mg/dL (ref 8.9–10.3)
Chloride: 104 mmol/L (ref 98–111)
Creatinine: 0.82 mg/dL (ref 0.44–1.00)
GFR, Estimated: >60 mL/min (ref >60)
Glucose, Bld: 83 mg/dL (ref 70–99)
Potassium: 3.9 mmol/L (ref 3.5–5.1)
Sodium: 141 mmol/L (ref 135–145)
Total Bilirubin: 0.6 mg/dL (ref 0.3–1.2)
Total Protein: 7.5 g/dL (ref 6.5–8.1)

## 2022-08-23 LAB — CBC WITH DIFFERENTIAL (CANCER CENTER ONLY)
Abs Immature Granulocytes: 0.01 10*3/uL (ref 0.00–0.07)
Basophils Absolute: 0 10*3/uL (ref 0.0–0.1)
Basophils Relative: 1 %
Eosinophils Absolute: 0.1 10*3/uL (ref 0.0–0.5)
Eosinophils Relative: 1 %
HCT: 41.5 % (ref 36.0–46.0)
Hemoglobin: 13.4 g/dL (ref 12.0–15.0)
Immature Granulocytes: 0 %
Lymphocytes Relative: 43 %
Lymphs Abs: 2.3 10*3/uL (ref 0.7–4.0)
MCH: 29.5 pg (ref 26.0–34.0)
MCHC: 32.3 g/dL (ref 30.0–36.0)
MCV: 91.2 fL (ref 80.0–100.0)
Monocytes Absolute: 0.5 10*3/uL (ref 0.1–1.0)
Monocytes Relative: 10 %
Neutro Abs: 2.4 10*3/uL (ref 1.7–7.7)
Neutrophils Relative %: 45 %
Platelet Count: 98 10*3/uL — ABNORMAL LOW (ref 150–400)
RBC: 4.55 MIL/uL (ref 3.87–5.11)
RDW: 14 % (ref 11.5–15.5)
WBC Count: 5.2 10*3/uL (ref 4.0–10.5)
nRBC: 0 % (ref 0.0–0.2)

## 2022-08-23 NOTE — Progress Notes (Signed)
Hematology and Oncology Follow Up Visit  Margaret Smith [id number] 1951-07-17 71 y.o. 08/23/2022   Principle Diagnosis:  Thrombocytopenia-likely mild ITP  Current Therapy:   Observation     Interim History:  Margaret Smith is back for her follow-up.  So far, she is doing quite well.  The big news is that she is going over to [place] on a cruise.  She will be leaving in about a month.  She is going to really enjoy self over there.  Otherwise, she feels well.  She has had no problems with bleeding or bruising.  She has had no change in medications.  She has had no cough or shortness of breath.  She has had no leg swelling.  She has had no headache.  Currently, I would say performance status is probably ECOG 1.     Medications:  Current Outpatient Medications:    Melatonin 10 MG TABS, Take 10 mg by mouth as needed., Disp: , Rfl:    acetaminophen (TYLENOL 8 HOUR ARTHRITIS PAIN) 650 MG CR tablet, Take 650 mg by mouth every 8 (eight) hours as needed for pain., Disp: , Rfl:    rosuvastatin (CRESTOR) 10 MG tablet, Take 1 tablet (10 mg total) by mouth at bedtime., Disp: 90 tablet, Rfl: 3  Allergies: No Known Allergies  Past Medical History, Surgical history, Social history, and Family History were reviewed and updated.  Review of Systems: Review of Systems  Constitutional: Negative.   HENT:  Negative.    Eyes: Negative.   Respiratory: Negative.    Cardiovascular: Negative.   Gastrointestinal: Negative.   Endocrine: Negative.   Genitourinary: Negative.    Musculoskeletal: Negative.   Skin: Negative.   Neurological: Negative.   Hematological: Negative.   Psychiatric/Behavioral: Negative.      Physical Exam:  height is 5\' 3"  (1.6 m) and weight is 172 lb (78 kg). Her oral temperature is 98 F (36.7 C). Her blood pressure is 170/63 (abnormal) and her pulse is 50 (abnormal). Her respiration is 18 and oxygen saturation is 100%.   Wt Readings from Last 3 Encounters:  08/23/22 172 lb  (78 kg)  02/21/22 186 lb 12.8 oz (84.7 kg)  11/23/21 182 lb (82.6 kg)    Physical Exam Vitals reviewed.  HENT:     Head: Normocephalic and atraumatic.  Eyes:     Pupils: Pupils are equal, round, and reactive to light.  Cardiovascular:     Rate and Rhythm: Normal rate and regular rhythm.     Heart sounds: Normal heart sounds.  Pulmonary:     Effort: Pulmonary effort is normal.     Breath sounds: Normal breath sounds.  Abdominal:     General: Bowel sounds are normal.     Palpations: Abdomen is soft.  Musculoskeletal:        General: No tenderness or deformity. Normal range of motion.     Cervical back: Normal range of motion.  Lymphadenopathy:     Cervical: No cervical adenopathy.  Skin:    General: Skin is warm and dry.     Findings: No erythema or rash.  Neurological:     Mental Status: She is alert and oriented to person, place, and time.  Psychiatric:        Behavior: Behavior normal.        Thought Content: Thought content normal.        Judgment: Judgment normal.      Lab Results  Component Value Date   WBC 5.2 08/23/2022  HGB 13.4 08/23/2022   HCT 41.5 08/23/2022   MCV 91.2 08/23/2022   PLT 98 (L) 08/23/2022     Chemistry      Component Value Date/Time   NA 141 08/23/2022 0950   K 3.9 08/23/2022 0950   CL 104 08/23/2022 0950   CO2 30 08/23/2022 0950   BUN 15 08/23/2022 0950   CREATININE 0.82 08/23/2022 0950   CREATININE 0.71 10/23/2021 0000   GLU 85 04/12/2015 0000      Component Value Date/Time   CALCIUM 9.8 08/23/2022 0950   ALKPHOS 64 08/23/2022 0950   AST 22 08/23/2022 0950   ALT 14 08/23/2022 0950   BILITOT 0.6 08/23/2022 0950      Impression and Plan: Margaret Smith is a very nice 71 year old Afro-American female.  She is quite funny.  She used to work for the [employer].  So glad that she had a wonderful 71th birthday.  I think the screws is part of her 71th birthday celebration.  I told her to be careful with COVID over in  [place].  COVID is a lot more prevalent over in [place] than it is in the [place].  Her platelet count is holding steady.  I looked at her blood smear.  I do not see anything on the blood smear that looked suspicious.  She had slightly decreased platelet number but she had several large platelets.  The plan is for overall granulated.  I think we can still get her back in 6 months.   Josph Macho, MD 9/15/202311:24 AM

## 2022-09-03 DIAGNOSIS — Z23 Encounter for immunization: Secondary | ICD-10-CM | POA: Diagnosis not present

## 2022-09-12 DIAGNOSIS — K118 Other diseases of salivary glands: Secondary | ICD-10-CM | POA: Diagnosis not present

## 2022-09-17 ENCOUNTER — Ambulatory Visit (INDEPENDENT_AMBULATORY_CARE_PROVIDER_SITE_OTHER): Payer: Medicare Other | Admitting: Family Medicine

## 2022-09-17 VITALS — BP 167/67 | HR 51 | Ht 63.0 in | Wt 169.0 lb

## 2022-09-17 DIAGNOSIS — R000 Tachycardia, unspecified: Secondary | ICD-10-CM | POA: Diagnosis not present

## 2022-09-17 DIAGNOSIS — Z23 Encounter for immunization: Secondary | ICD-10-CM

## 2022-09-17 DIAGNOSIS — I10 Essential (primary) hypertension: Secondary | ICD-10-CM

## 2022-09-17 DIAGNOSIS — R001 Bradycardia, unspecified: Secondary | ICD-10-CM

## 2022-09-17 MED ORDER — LOSARTAN POTASSIUM 25 MG PO TABS: 25.0000 mg | ORAL_TABLET | Freq: Every day | ORAL | 1 refills | Status: DC

## 2022-09-17 NOTE — Assessment & Plan Note (Signed)
Blood pressure is still elevated today.  Again sometimes she has had good blood pressures but more often than not they are elevated when she comes in.  Again she exercises regularly.  We we will go ahead and start low-dose losartan show need to have a repeat BMP in 1 week after starting the medication and we can do a nurse visit when she returns from her trip to recheck her blood pressure.

## 2022-09-17 NOTE — Progress Notes (Signed)
Acute Office Visit  Subjective:     Patient ID: Margaret Smith, female    DOB: [birth year], 71 y.o.   MRN: [id number]  Chief Complaint  Patient presents with   Tachycardia    HPI Patient is in today for tachycardia -she came in today because she started wearing a device on her wrist which tracks her exercise and her heart rate.  She says she walks almost every day for exercise.  But on October 5 while she was still in bed she noted that her pulse was around 175.  She was not having any chest discomfort, tightness or even the sensation of tachycardia but her wearable noted that her pulse was high.  She has been tracking it since then and has had some high pulses at night.  But she is also had to pick up some lower heart rates in the 30s and 40s.  That night the only thing she noted that was a little unusual was that her legs just felt really uncomfortable it took her a while to actually get to sleep.  Has had on and off blood pressure elevations for quite some time but on some occasions her blood pressure looks quite good.  So we have always hold off on starting medication.  She just takes Crestor 10 mg nightly and occasionally Tylenol they recently actually quit taking her Tylenol at night.  She is actually scheduled for a cruise in a trip next Thursday.  Her wearable reports that during exercise and her walking routine normally her heart rate gets into the 120s.  ROS      Objective:    BP (!) 167/67   Pulse (!) 51   Ht 5\' 3"  (1.6 m)   Wt 169 lb (76.7 kg)   SpO2 100%   BMI 29.94 kg/m    Physical Exam  No results found for any visits on 09/17/22.      Assessment & Plan:   Problem List Items Addressed This Visit       Cardiovascular and Mediastinum   Primary hypertension    Blood pressure is still elevated today.  Again sometimes she has had good blood pressures but more often than not they are elevated when she comes in.  Again she exercises regularly.  We we will go  ahead and start low-dose losartan show need to have a repeat BMP in 1 week after starting the medication and we can do a nurse visit when she returns from her trip to recheck her blood pressure.      Relevant Medications   losartan (COZAAR) 25 MG tablet   Other Relevant Orders   BASIC METABOLIC PANEL WITH GFR     Other   Bradycardia   Relevant Orders   LONG TERM MONITOR (3-14 DAYS)   Other Visit Diagnoses     Tachycardia    -  Primary   Relevant Orders   EKG 12-Lead   LONG TERM MONITOR (3-14 DAYS)      EKG today shows a rate of 50 bpm, sinus bradycardia.  No acute ST-T wave changes.  No old EKG on file for her.  But no worrisome findings.  We did discuss in regards to the tachycardia that it could be an error with the wearable device.  Sometimes it will double pulse.  Heart rate is very regular here.  The only way to know for sure would be for [place] to get a cardiac monitor for her to wear for several days to  see if she is truly having some type of tacky rhythm or arrhythmia or if it may just be an error with the wearable.  We will get a Zio patch ordered hopefully we can get it placed and she can wear it before she leaves for her cruise.  Meds ordered this encounter  Medications   losartan (COZAAR) 25 MG tablet    Sig: Take 1 tablet (25 mg total) by mouth daily.    Dispense:  30 tablet    Refill:  1    No follow-ups on file.  Beatrice Lecher, MD

## 2022-09-18 ENCOUNTER — Encounter: Payer: Self-pay | Admitting: *Deleted

## 2022-09-18 ENCOUNTER — Ambulatory Visit: Payer: Medicare Other | Attending: Family Medicine

## 2022-09-18 DIAGNOSIS — R000 Tachycardia, unspecified: Secondary | ICD-10-CM

## 2022-09-18 DIAGNOSIS — R001 Bradycardia, unspecified: Secondary | ICD-10-CM

## 2022-09-18 NOTE — Progress Notes (Unsigned)
Enrolled for [employer] to mail a ZIO XT long term holter monitor to the patients address on file.   Letter with instructions mailed to patient.  DOD to read. 

## 2022-09-23 NOTE — Addendum Note (Signed)
Addended byAnnamaria Helling on: 09/23/2022 11:34 AM   Modules accepted: Orders

## 2022-09-24 DIAGNOSIS — I10 Essential (primary) hypertension: Secondary | ICD-10-CM | POA: Diagnosis not present

## 2022-09-25 LAB — BASIC METABOLIC PANEL WITH GFR
BUN: 12 mg/dL (ref 7–25)
CO2: 28 mmol/L (ref 20–32)
Calcium: 9.3 mg/dL (ref 8.6–10.4)
Chloride: 105 mmol/L (ref 98–110)
Creat: 0.66 mg/dL (ref 0.60–1.00)
Glucose, Bld: 78 mg/dL (ref 65–99)
Potassium: 4.7 mmol/L (ref 3.5–5.3)
Sodium: 141 mmol/L (ref 135–146)
eGFR: 94 mL/min/{1.73_m2} (ref >=60)

## 2022-09-25 NOTE — Progress Notes (Signed)
Your lab work is within acceptable range and there are no concerning findings.   ?

## 2022-10-09 DIAGNOSIS — R000 Tachycardia, unspecified: Secondary | ICD-10-CM

## 2022-10-09 DIAGNOSIS — R001 Bradycardia, unspecified: Secondary | ICD-10-CM

## 2022-10-14 ENCOUNTER — Other Ambulatory Visit: Payer: Self-pay | Admitting: Family Medicine

## 2022-10-14 DIAGNOSIS — E785 Hyperlipidemia, unspecified: Secondary | ICD-10-CM

## 2022-10-22 DIAGNOSIS — R000 Tachycardia, unspecified: Secondary | ICD-10-CM | POA: Diagnosis not present

## 2022-10-22 DIAGNOSIS — R001 Bradycardia, unspecified: Secondary | ICD-10-CM | POA: Diagnosis not present

## 2022-10-24 ENCOUNTER — Ambulatory Visit (INDEPENDENT_AMBULATORY_CARE_PROVIDER_SITE_OTHER): Payer: Medicare Other | Admitting: Family Medicine

## 2022-10-24 ENCOUNTER — Encounter: Payer: Self-pay | Admitting: Family Medicine

## 2022-10-24 VITALS — BP 166/50 | HR 47 | Ht 63.0 in | Wt 172.0 lb

## 2022-10-24 DIAGNOSIS — I10 Essential (primary) hypertension: Secondary | ICD-10-CM | POA: Diagnosis not present

## 2022-10-24 DIAGNOSIS — Z1231 Encounter for screening mammogram for malignant neoplasm of breast: Secondary | ICD-10-CM | POA: Diagnosis not present

## 2022-10-24 DIAGNOSIS — Z1211 Encounter for screening for malignant neoplasm of colon: Secondary | ICD-10-CM

## 2022-10-24 DIAGNOSIS — E785 Hyperlipidemia, unspecified: Secondary | ICD-10-CM | POA: Diagnosis not present

## 2022-10-24 DIAGNOSIS — Z13220 Encounter for screening for lipoid disorders: Secondary | ICD-10-CM | POA: Diagnosis not present

## 2022-10-24 DIAGNOSIS — E010 Iodine-deficiency related diffuse (endemic) goiter: Secondary | ICD-10-CM

## 2022-10-24 DIAGNOSIS — L609 Nail disorder, unspecified: Secondary | ICD-10-CM | POA: Diagnosis not present

## 2022-10-24 DIAGNOSIS — I480 Paroxysmal atrial fibrillation: Secondary | ICD-10-CM | POA: Diagnosis not present

## 2022-10-24 DIAGNOSIS — Z0000 Encounter for general adult medical examination without abnormal findings: Secondary | ICD-10-CM | POA: Diagnosis not present

## 2022-10-24 NOTE — Progress Notes (Signed)
Viewed results during office visit today.  Referral placed to cardiology for the A-fib that was seen.  I do not think she will tolerate a beta-blocker because she does have heart rates as low as the 40s.  I did encourage her to go ahead and start a daily aspirin while she is waiting to get in with cardiology.      This patients CHA2DS2-VASc Score and unadjusted Ischemic Stroke Rate (% per year) is equal to 3.2 % stroke rate/year from a score of 3  Above score calculated as 1 point each if present [CHF, HTN, DM, Vascular=MI/PAD/Aortic Plaque, Age if 65-74, or Female] Above score calculated as 2 points each if present [Age > 75, or Stroke/TIA/TE]

## 2022-10-24 NOTE — Progress Notes (Signed)
A couple of other concerns today.  She recently went to [place] and was walking around a lot and noticed a little bit of blood underneath her right great toenail.  She says it does seem to be getting a little bit better but it still present.  On her fourth toe on her left foot she has had recurring issues for almost 3 years with getting blood underneath that nail and then it falls off.  We discussed referral to podiatry for further work-up.   Also her heart monitor results came back.  She did the monitor after she got back from her trip to [place].  She still noticing some low heart rates in the 40s based on her Fitbit as well as fast heart rates greater than 100.  We reviewed the results of her heart monitor today.  They also noted some A-fib.  Her CHA2DS2-VASc score is 3 and get a go ahead and start her on aspirin for now and refer her to cardiology.  I do not want to put her on a beta-blocker because she does have bradycardia at times.  We also discussed continuing to manage the blood pressure.  Increase losartan to 50 mg.    This patients CHA2DS2-VASc Score and unadjusted Ischemic Stroke Rate (% per year) is equal to 3.2 % stroke rate/year from a score of 3  Above score calculated as 1 point each if present [CHF, HTN, DM, Vascular=MI/PAD/Aortic Plaque, Age if 65-74, or Female] Above score calculated as 2 points each if present [Age > 75, or Stroke/TIA/TE]

## 2022-10-24 NOTE — Progress Notes (Addendum)
Subjective:   Margaret Smith is a 71 y.o. female who presents for Medicare Annual (Subsequent) preventive examination.  Review of Systems           Objective:    Today's Vitals   10/24/22 0946 10/24/22 1022  BP: (!) 167/58 (!) 166/50  Pulse: (!) 47   SpO2: 100%   Weight: 172 lb (78 kg)   Height: 5\' 3"  (1.6 m)    Body mass index is 30.47 kg/m.     02/21/2022   10:24 AM 10/23/2021    9:44 AM 11/15/2020   11:29 AM 12/22/2014    9:23 AM  Advanced Directives  Does Patient Have a Medical Advance Directive? No No No No  Would patient like information on creating a medical advance directive? No - Patient declined Yes (MAU/Ambulatory/Procedural Areas - Information given) No - Patient declined Yes - Educational materials given    Current Medications (verified) Outpatient Encounter Medications as of 10/24/2022  Medication Sig   acetaminophen (TYLENOL 8 HOUR ARTHRITIS PAIN) 650 MG CR tablet Take 650 mg by mouth every 8 (eight) hours as needed for pain.   losartan (COZAAR) 25 MG tablet Take 1 tablet (25 mg total) by mouth daily.   Melatonin 10 MG TABS Take 10 mg by mouth as needed.   rosuvastatin (CRESTOR) 10 MG tablet Take 1 tablet (10 mg total) by mouth daily. NEEDS LABS/APPT   No facility-administered encounter medications on file as of 10/24/2022.    Allergies (verified) Patient has no known allergies.   History: Past Medical History:  Diagnosis Date   Hypertension    Pneumonia    Thrombocytopenia (HCC) 11/15/2020   Past Surgical History:  Procedure Laterality Date   CARPAL TUNNEL RELEASE  1980s   bilateral.    LIPOMA EXCISION     left forearm.    REPLACEMENT TOTAL KNEE Left    Dr. Pill   Family History  Problem Relation Age of Onset   Hypertension Father    Diabetes Father    Pancreatic cancer Father    Hypertension Mother    Hyperlipidemia Mother    Diabetes Mother    Heart attack Mother    Stroke Mother    Seizures Mother    Hypertension Sister     Hypertension Brother    Diabetes Maternal Grandfather    Breast cancer Paternal Aunt    Kidney disease Sister    Social History   Socioeconomic History   Marital status: Single    Spouse name: Not on file   Number of children: Not on file   Years of education: Not on file   Highest education level: Not on file  Occupational History   Occupation: Attention officer    Comment: [employer]  Tobacco Use   Smoking status: Former    Types: Cigarettes    Quit date: 04/08/2004    Years since quitting: 18.5   Smokeless tobacco: Never  Vaping Use   Vaping Use: Never used  Substance and Sexual Activity   Alcohol use: No   Drug use: No   Sexual activity: Never  Other Topics Concern   Not on file  Social History Narrative   Walks for exercise 5-7  days per week for 45 min. she lives and takes care of her mother.   Social Determinants of Health   Financial Resource Strain: Not on file  Food Insecurity: Not on file  Transportation Needs: Not on file  Physical Activity: Not on file  Stress: Not  on file  Social Connections: Not on file    Tobacco Counseling Counseling given: Not Answered   Clinical Intake:  Pre-visit preparation completed: Yes        BMI - recorded: 30 Nutritional Status: BMI 25 -29 Overweight Diabetes: No     Diabetic?No       Activities of Daily Living    10/24/2022   10:22 AM  In your present state of health, do you have any difficulty performing the following activities:  Hearing? 0  Vision? 0  Difficulty concentrating or making decisions? 0  Walking or climbing stairs? 0  Doing errands, shopping? 0    Patient Care Team: Agapito Games, MD as PCP - General (Family Medicine) Raynald Blend, OD as Referring Physician (Optometry)  Indicate any recent Medical Services you may have received from other than [facility] providers in the past year (date may be approximate).     Assessment:   This is a routine wellness examination  for Margaret Smith.  Hearing/Vision screen Sees eye care center in [place] town, Dr. Margo Aye for eye exams. No results found.  Dietary issues and exercise activities discussed: Current Exercise Habits: Home exercise routine, Type of exercise: walking, Time (Minutes): 60, Frequency (Times/Week): 5, Weekly Exercise (Minutes/Week): 300, Intensity: Moderate   Goals Addressed             This Visit's Progress    Exercise 150 min/wk Moderate Activity       Keep up the great work on exercising!!!        Depression Screen    10/24/2022    9:48 AM 10/23/2021    9:44 AM 10/19/2020   10:09 AM 10/12/2019    8:52 AM 08/05/2018    9:18 AM 08/05/2018    9:17 AM 08/05/2018    9:15 AM  PHQ 2/9 Scores  PHQ - 2 Score 0 0 0 3 0 0 0  PHQ- 9 Score    7  0     Fall Risk    10/24/2022    9:48 AM 10/23/2021    9:44 AM 10/20/2020    8:55 AM 11/01/2019   12:19 PM 10/12/2019    8:33 AM  Fall Risk   Falls in the past year? 0 1 0 1 1  Comment    Emmi Telephone Survey: data to providers prior to load   Number falls in past yr: 0 0  1 1  Comment    Emmi Telephone Survey Actual Response = 1   Injury with Fall? 0 0  0 1  Risk for fall due to : No Fall Risks No Fall Risks No Fall Risks  Other (Comment)  Risk for fall due to: Comment     stumbled on uneven pavement  Follow up Falls evaluation completed Falls prevention discussed;Falls evaluation completed Falls evaluation completed  Falls prevention discussed    FALL RISK PREVENTION PERTAINING TO THE HOME:  No recent falls.   ASSISTIVE DEVICES UTILIZED TO PREVENT FALLS:  Life alert? No  Use of a cane, walker or w/c? No   TIMED UP AND GO:  Was the test performed? No .  .   Gait steady and fast without use of assistive device  Cognitive Function:        10/24/2022   10:22 AM 10/12/2019    8:33 AM 08/05/2018    9:17 AM  6CIT Screen  What Year? 0 points 0 points 0 points  What month? 0 points 0 points 0 points  What time? 0 points 0  points 0 points  Count back from 20 0 points 0 points 0 points  Months in reverse 0 points 0 points 0 points  Repeat phrase 0 points 2 points 0 points  Total Score 0 points 2 points 0 points    Immunizations Immunization History  Administered Date(s) Administered   COVID-19, mRNA, vaccine(Comirnaty)12 years and older 09/17/2022   Fluad Quad(high Dose 65+) 10/12/2019, 10/19/2020, 09/29/2021   Influenza Inj Mdck Quad Pf 09/08/2015   Influenza, High Dose Seasonal PF 09/05/2022   Influenza,inj,Quad PF,6+ Mos 11/18/2014, 12/28/2016   Influenza,inj,quad, With Preservative 09/08/2015   Influenza-Unspecified 10/13/2013, 09/09/2015   Moderna Covid-19 Vaccine Bivalent Booster 70yrs & up 10/15/2021   Moderna Sars-Covid-2 Vaccination 02/17/2020, 03/09/2020, 10/14/2020, 07/09/2021   Pneumococcal Conjugate-13 11/26/2013   Pneumococcal Polysaccharide-23 08/05/2018   Tdap 11/26/2013   Zoster Recombinat (Shingrix) 12/14/2020, 02/28/2021   Zoster, Live 11/18/2014    TDAP status: Up to date  Flu Vaccine status: Up to date  Pneumococcal vaccine status: Up to date  Covid-19 vaccine status: Completed vaccines  Qualifies for Shingles Vaccine? No   Zostavax completed Yes   Shingrix Completed?: Yes  Screening Tests Health Maintenance  Topic Date Due   COVID-19 Vaccine (6 - Moderna series) 02/12/2022   COLONOSCOPY (Pts 45-20yrs Insurance coverage will need to be confirmed)  09/18/2023 (Originally 09/17/2022)   Medicare Annual Wellness (AWV)  10/25/2023   TETANUS/TDAP  11/27/2023   MAMMOGRAM  12/06/2023   Pneumonia Vaccine 25+ Years old  Completed   INFLUENZA VACCINE  Completed   DEXA SCAN  Completed   Hepatitis C Screening  Completed   Zoster Vaccines- Shingrix  Completed   HPV VACCINES  Aged Out    Health Maintenance  Health Maintenance Due  Topic Date Due   COVID-19 Vaccine (6 - Moderna series) 02/12/2022    Colorectal cancer screening: Referral to GI placed today. Pt aware the  office will call re: appt.  Mammogram status: Ordered today. Pt provided with contact info and advised to call to schedule appt.   Bone Density status: Completed 2019. Results reflect: Bone density results: NORMAL. Repeat every 5 years.  Lung Cancer Screening: (Low Dose CT Chest recommended if Age 36-80 years, 30 pack-year currently smoking OR have quit w/in 15years.) does not qualify.   Additional Screening:  Hepatitis C Screening: does not qualify; Completed   Vision Screening: Recommended annual ophthalmology exams for early detection of glaucoma and other disorders of the eye.  Dental Screening: Recommended annual dental exams for proper oral hygiene  Community Resource Referral / Chronic Care Management: CRR required this visit?  No   CCM required this visit?  No      Plan:     I have personally reviewed and noted the following in the patient's chart:   Medical and social history Use of alcohol, tobacco or illicit drugs  Current medications and supplements including opioid prescriptions. Patient is not currently taking opioid prescriptions. Functional ability and status Nutritional status Physical activity Advanced directives List of other physicians Hospitalizations, surgeries, and ER visits in previous 12 months Vitals Screenings to include cognitive, depression, and falls Referrals and appointments  In addition, I have reviewed and discussed with patient certain preventive protocols, quality metrics, and best practice recommendations. A written personalized care plan for preventive services as well as general preventive health recommendations were provided to patient.     Nani Gasser, MD   10/24/2022   Nurse Notes:

## 2022-10-24 NOTE — Patient Instructions (Signed)
  Margaret Smith , Thank you for taking time to come for your Medicare Wellness Visit. I appreciate your ongoing commitment to your health goals. Please review the following plan we discussed and let me know if I can assist you in the future.   These are the goals we discussed:  Goals      Exercise 150 min/wk Moderate Activity     Keep up the great work on exercising!!!         This is a list of the screening recommended for you and due dates:  Health Maintenance  Topic Date Due   COVID-19 Vaccine (6 - Moderna series) 02/12/2022   Colon Cancer Screening  09/18/2023*   Medicare Annual Wellness Visit  10/25/2023   Tetanus Vaccine  11/27/2023   Mammogram  12/06/2023   Pneumonia Vaccine  Completed   Flu Shot  Completed   DEXA scan (bone density measurement)  Completed   Hepatitis C Screening: USPSTF Recommendation to screen - Ages 18-79 yo.  Completed   Zoster (Shingles) Vaccine  Completed   HPV Vaccine  Aged Out  *Topic was postponed. The date shown is not the original due date.

## 2022-10-25 NOTE — Progress Notes (Signed)
Call patient: Cholesterol looks absolutely fantastic!  Randie [employer] Work!  The rest labs look good so far.  Still awaiting thyroid level.

## 2022-10-26 LAB — COMPLETE METABOLIC PANEL WITH GFR
AG Ratio: 1.5 (calc) (ref 1.0–2.5)
ALT: 14 U/L (ref 6–29)
AST: 23 U/L (ref 10–35)
Albumin: 4.1 g/dL (ref 3.6–5.1)
Alkaline phosphatase (APISO): 70 U/L (ref 37–153)
BUN: 12 mg/dL (ref 7–25)
CO2: 27 mmol/L (ref 20–32)
Calcium: 9.2 mg/dL (ref 8.6–10.4)
Chloride: 103 mmol/L (ref 98–110)
Creat: 0.7 mg/dL (ref 0.60–1.00)
Globulin: 2.8 g/dL (calc) (ref 1.9–3.7)
Glucose, Bld: 77 mg/dL (ref 65–99)
Potassium: 4.2 mmol/L (ref 3.5–5.3)
Sodium: 141 mmol/L (ref 135–146)
Total Bilirubin: 0.6 mg/dL (ref 0.2–1.2)
Total Protein: 6.9 g/dL (ref 6.1–8.1)
eGFR: 93 mL/min/{1.73_m2} (ref >=60)

## 2022-10-26 LAB — LIPID PANEL
Cholesterol: 159 mg/dL (ref <200)
HDL: 76 mg/dL (ref >=50)
LDL Cholesterol (Calc): 69 mg/dL (calc)
Non-HDL Cholesterol (Calc): 83 mg/dL (calc) (ref <130)
Total CHOL/HDL Ratio: 2.1 (calc) (ref <5.0)
Triglycerides: 48 mg/dL (ref <150)

## 2022-10-26 LAB — EXTRA SPECIMEN

## 2022-10-26 LAB — TSH: TSH: 1.57 mIU/L (ref 0.40–4.50)

## 2022-10-28 NOTE — Progress Notes (Signed)
Call patient: Thyroid looks great at 1.5.  Meds ordered this encounter  Medications   aspirin EC 81 MG tablet    Sig: Take 1 tablet (81 mg total) by mouth daily. Swallow whole.    Dispense:  90 tablet    Refill:  4

## 2022-10-29 MED ORDER — ASPIRIN 81 MG PO TBEC: 81.0000 mg | DELAYED_RELEASE_TABLET | Freq: Every day | ORAL | 4 refills | Status: DC

## 2022-10-29 MED ORDER — TRAZODONE HCL 50 MG PO TABS: 25.0000 mg | ORAL_TABLET | Freq: Every evening | ORAL | 3 refills | Status: DC | PRN

## 2022-10-29 NOTE — Addendum Note (Signed)
Addended by: Nani Gasser D on: 10/29/2022 08:10 AM   Modules accepted: Orders

## 2022-10-29 NOTE — Progress Notes (Signed)
Meds sent    Thank you

## 2022-11-12 DIAGNOSIS — I4891 Unspecified atrial fibrillation: Secondary | ICD-10-CM | POA: Diagnosis not present

## 2022-11-12 DIAGNOSIS — I480 Paroxysmal atrial fibrillation: Secondary | ICD-10-CM | POA: Insufficient documentation

## 2022-11-12 DIAGNOSIS — I10 Essential (primary) hypertension: Secondary | ICD-10-CM | POA: Diagnosis not present

## 2022-11-12 DIAGNOSIS — G4730 Sleep apnea, unspecified: Secondary | ICD-10-CM | POA: Diagnosis not present

## 2022-11-12 DIAGNOSIS — Z7689 Persons encountering health services in other specified circumstances: Secondary | ICD-10-CM | POA: Diagnosis not present

## 2022-11-14 ENCOUNTER — Encounter: Payer: Self-pay | Admitting: Podiatrist

## 2022-11-14 ENCOUNTER — Ambulatory Visit (INDEPENDENT_AMBULATORY_CARE_PROVIDER_SITE_OTHER): Payer: Medicare Other | Admitting: Podiatrist

## 2022-11-14 DIAGNOSIS — S90111A Contusion of right great toe without damage to nail, initial encounter: Secondary | ICD-10-CM

## 2022-11-14 DIAGNOSIS — L603 Nail dystrophy: Secondary | ICD-10-CM | POA: Diagnosis not present

## 2022-11-14 NOTE — Patient Instructions (Addendum)
I have ordered a medication for you that will come from [place] in [place]. They should be calling you to verify insurance and will mail the medication to you. If you live close by then you can go by their pharmacy to pick up the medication. Their phone number is [phone]. If you do not hear from them in the next few days, please give [place] a call at [phone].    [facility] Tendinitis Rehab Ask your health care provider which exercises are safe for you. Do exercises exactly as told by your health care provider and adjust them as directed. It is normal to feel mild stretching, pulling, tightness, or discomfort as you do these exercises. Stop right away if you feel sudden pain or your pain gets worse. Do not begin these exercises until told by your health care provider. Stretching and range-of-motion exercises These exercises warm up your muscles and joints and improve the movement and flexibility of your ankle. These exercises also help to relieve pain. Standing wall calf stretch with straight knee  Stand with your hands against a wall. Extend your left / right leg behind you, and bend your front knee slightly. Keep both of your heels on the floor. Point the toes of your back foot slightly inward. Keeping your heels on the floor and your back knee straight, shift your weight toward the wall. Do not allow your back to arch. You should feel a gentle stretch in your upper calf. Hold this position for __________ seconds. Repeat __________ times. Complete this exercise __________ times a day. Standing wall calf stretch with bent knee Stand with your hands against a wall. Extend your left / right leg behind you, and bend your front knee slightly. Keep both of your heels on the floor. Point the toes of your back foot slightly inward. Keeping your heels on the floor, bend your back knee slightly. You should feel a gentle stretch deep in your lower calf near your heel. Hold this position  for __________ seconds. Repeat __________ times. Complete this exercise __________ times a day. Strengthening exercises These exercises build strength and control of your ankle. Endurance is the ability to use your muscles for a long time, even after they get tired. Plantar flexion with band In this exercise, you push your toes downward, away from you, with an exercise band providing resistance. Sit on the floor with your left / right leg extended. You may put a pillow under your calf to give your foot more room to move. Loop a rubber exercise band or tube around the ball of your left / right foot. The ball of your foot is on the walking surface, right under your toes. The band or tube should be slightly tense when your foot is relaxed. If the band or tube slips, you can put on your shoe or put a washcloth between the band and your foot to help it stay in place. Slowly point your toes downward, pushing them away from you (plantar flexion). Hold this position for __________ seconds. Slowly release the tension in the band or tube, controlling smoothly until your foot is back to the starting position. Repeat steps 1-5 with your left / right leg. Repeat __________ times. Complete this exercise __________ times a day. Eccentric heel drop  In this exercise, you stand and slowly raise your heel and then slowly lower it. This exercise lengthens the calf muscles (eccentric) while the heel bears weight. If this exercise is too easy, try doing it while  wearing a backpack with weights in it. Stand on a step with the balls of your feet. The ball of your foot is on the walking surface, right under your toes. Do not put your heels on the step. For balance, rest your hands on the wall or on a railing. Rise up onto the balls of your feet. Keeping your heels up, shift all of your weight to your left / right leg and pick up your other leg. Slowly lower your left / right leg so your heel drops below the level of  the step. Put down your other foot before returning to the start position. If told by your health care provider, build up to: 3 sets of 15 repetitions while keeping your knees straight. 3 sets of 15 repetitions while keeping your knees slightly bent as far as told by your health care provider. Repeat __________ times. Complete this exercise __________ times a day. Balance exercises These exercises improve or maintain your balance. Balance is important in preventing falls. Single leg stand If this exercise is too easy, you can try it with your eyes closed or while standing on a pillow. Without shoes, stand near a railing or in a door frame. Hold on to the railing or door frame as needed. Stand on your left / right foot. Keep your big toe down on the floor and try to keep your arch lifted. Hold this position for __________ seconds. Repeat __________ times. Complete this exercise __________ times a day. This information is not intended to replace advice given to you by your health care provider. Make sure you discuss any questions you have with your health care provider. Document Revised: 01/16/2021 Document Reviewed: 01/16/2021 Elsevier Patient Education  2023 [employer].

## 2022-11-14 NOTE — Progress Notes (Signed)
Chief Complaint  Patient presents with   Nail Problem    Bilateral nails      HPI: Patient is 71 y.o. female who presents today for bruising under the right great toenail and discolored third digit.  On the right great toenail she relates she noticed some blood under the nail and wants to make sure that the nail is growing out okay.  The left third toenail is yellowish-brownish discoloration and has been started to come off over the past 2 weeks.  She relates no pain in the nails.  No pus or purulence or drainage is noted.  Patient Active Problem List   Diagnosis Date Noted   Primary hypertension 09/17/2022   Thrombocytopenia (HCC) 11/15/2020   White coat syndrome without diagnosis of hypertension 01/21/2017   Bradycardia 12/28/2015   Thyromegaly 12/28/2015   Primary osteoarthritis of right knee, post left knee arthroplasty 06/26/2015    Current Outpatient Medications on File Prior to Visit  Medication Sig Dispense Refill   acetaminophen (TYLENOL 8 HOUR ARTHRITIS PAIN) 650 MG CR tablet Take 650 mg by mouth every 8 (eight) hours as needed for pain.     aspirin EC 81 MG tablet Take 1 tablet (81 mg total) by mouth daily. Swallow whole. 90 tablet 4   losartan (COZAAR) 25 MG tablet Take 1 tablet (25 mg total) by mouth daily. 30 tablet 1   Melatonin 10 MG TABS Take 10 mg by mouth as needed.     rosuvastatin (CRESTOR) 10 MG tablet Take 1 tablet (10 mg total) by mouth daily. NEEDS LABS/APPT 90 tablet 0   traZODone (DESYREL) 50 MG tablet Take 0.5-1 tablets (25-50 mg total) by mouth at bedtime as needed for sleep. 30 tablet 3   No current facility-administered medications on file prior to visit.    No Known Allergies  Review of Systems No fevers, chills, nausea, muscle aches, no difficulty breathing, no calf pain, no chest pain or shortness of breath.   Physical Exam  GENERAL APPEARANCE: Alert, conversant. Appropriately groomed. No acute distress.   VASCULAR: Pedal pulses palpable 2/4  DP and 1/4 PT bilateral.  Capillary refill time is immediate to all digits,  Proximal to distal cooling is warm to warm.  Digital perfusion adequate.   NEUROLOGIC: sensation is intact to 5.07 monofilament at 5/5 sites bilateral.  Light touch is intact bilateral, vibratory sensation intact bilateral  MUSCULOSKELETAL: acceptable muscle strength, tone and stability bilateral.  No gross boney pedal deformities noted.  No pain, crepitus or limitation noted with foot and ankle range of motion bilateral.   DERMATOLOGIC: skin is warm, supple, and dry.  Right great toenail has a subungual hematoma on the central portion of the nail.  No redness, no swelling, no pus or purulence is expressed.  Appears to have some dried blood under the toenail as it is a reddish purplish discoloration.  Left third toenail appears brittle, discolored, dystrophic and clinically mycotic.  Slightly lifting due to subungual debris.    Assessment     ICD-10-CM   1. Dystrophic nail  L60.3     2. Contusion of right great toe without damage to nail, initial encounter  S90.111A        Plan  Discussed exam findings as well as treatment options and recommendations.  Recommended she watch the right great toenail as the bruising under the toenail should grow out with the new nail.  If it fails to change over the next 3 months she will call.  For  the left third toenail we discussed fungal treatments.  She would like to try a topical and therefore I faxed in a prescription for a topical antifungal at [facility]  She will call if there are any issues with this medication.  Otherwise she will be seen back on an as-needed basis.

## 2022-11-15 DIAGNOSIS — G4733 Obstructive sleep apnea (adult) (pediatric): Secondary | ICD-10-CM | POA: Insufficient documentation

## 2022-11-15 DIAGNOSIS — G4730 Sleep apnea, unspecified: Secondary | ICD-10-CM | POA: Insufficient documentation

## 2022-11-18 ENCOUNTER — Other Ambulatory Visit: Payer: Self-pay | Admitting: Family Medicine

## 2022-12-11 ENCOUNTER — Ambulatory Visit (INDEPENDENT_AMBULATORY_CARE_PROVIDER_SITE_OTHER): Payer: Medicare Other

## 2022-12-11 DIAGNOSIS — Z1231 Encounter for screening mammogram for malignant neoplasm of breast: Secondary | ICD-10-CM | POA: Diagnosis not present

## 2022-12-12 NOTE — Addendum Note (Signed)
Addended by: Rae Lips on: 12/12/2022 09:15 AM   Modules accepted: Orders

## 2022-12-12 NOTE — Progress Notes (Signed)
Please call patient. Normal mammogram.  Repeat in 1 year.  

## 2022-12-13 ENCOUNTER — Other Ambulatory Visit: Payer: Self-pay | Admitting: Family Medicine

## 2022-12-13 DIAGNOSIS — R0683 Snoring: Secondary | ICD-10-CM

## 2022-12-13 NOTE — Progress Notes (Signed)
Ref for sleep study placed.

## 2022-12-19 ENCOUNTER — Telehealth: Payer: Self-pay | Admitting: Family Medicine

## 2022-12-19 DIAGNOSIS — I4891 Unspecified atrial fibrillation: Secondary | ICD-10-CM | POA: Diagnosis not present

## 2022-12-19 NOTE — Telephone Encounter (Signed)
Pt. Came by the office requesting results from heart monitor to be faxed to Dr. Maisie Fus office at [facility]. Pt states she was recently seen by the Cardiologists and was told medical information still has not been received.   EKG results and Home monitoring results have been faxed to Dr. Maisie Fus office at [facility] at [phone].

## 2022-12-19 NOTE — Telephone Encounter (Signed)
Left message on patients voicemail notifying her that requested EKG and home monitoring results has been faxed to Dr. Maisie Fus office per request.

## 2023-01-17 ENCOUNTER — Other Ambulatory Visit: Payer: Self-pay | Admitting: Family Medicine

## 2023-01-24 ENCOUNTER — Encounter: Payer: Self-pay | Admitting: Family Medicine

## 2023-01-24 DIAGNOSIS — I340 Nonrheumatic mitral (valve) insufficiency: Secondary | ICD-10-CM | POA: Insufficient documentation

## 2023-01-24 DIAGNOSIS — I071 Rheumatic tricuspid insufficiency: Secondary | ICD-10-CM | POA: Insufficient documentation

## 2023-01-29 DIAGNOSIS — I10 Essential (primary) hypertension: Secondary | ICD-10-CM | POA: Diagnosis not present

## 2023-01-29 DIAGNOSIS — D123 Benign neoplasm of transverse colon: Secondary | ICD-10-CM | POA: Diagnosis not present

## 2023-01-29 DIAGNOSIS — Z1211 Encounter for screening for malignant neoplasm of colon: Secondary | ICD-10-CM | POA: Diagnosis not present

## 2023-01-29 DIAGNOSIS — I4891 Unspecified atrial fibrillation: Secondary | ICD-10-CM | POA: Diagnosis not present

## 2023-01-29 LAB — HM COLONOSCOPY

## 2023-01-31 ENCOUNTER — Ambulatory Visit (INDEPENDENT_AMBULATORY_CARE_PROVIDER_SITE_OTHER): Payer: Medicare Other

## 2023-01-31 ENCOUNTER — Ambulatory Visit (INDEPENDENT_AMBULATORY_CARE_PROVIDER_SITE_OTHER): Payer: Medicare Other | Admitting: Podiatry

## 2023-01-31 ENCOUNTER — Encounter: Payer: Self-pay | Admitting: Podiatry

## 2023-01-31 DIAGNOSIS — M79671 Pain in right foot: Secondary | ICD-10-CM

## 2023-01-31 DIAGNOSIS — M722 Plantar fascial fibromatosis: Secondary | ICD-10-CM

## 2023-01-31 DIAGNOSIS — M19071 Primary osteoarthritis, right ankle and foot: Secondary | ICD-10-CM | POA: Diagnosis not present

## 2023-01-31 DIAGNOSIS — M7731 Calcaneal spur, right foot: Secondary | ICD-10-CM | POA: Diagnosis not present

## 2023-01-31 MED ORDER — DEXAMETHASONE SODIUM PHOSPHATE 120 MG/30ML IJ SOLN: 4.0000 mg | Freq: Once | INTRAMUSCULAR | Status: DC

## 2023-01-31 NOTE — Progress Notes (Signed)
  Subjective:  Patient ID: Margaret Smith, female    DOB: 1951-07-11,   MRN: [id number]  Chief Complaint  Patient presents with   Foot Pain    Heel pain on going for months. Painful when pressure is applied     72 y.o. female presents for new concern of  right heel pain that has been going on for about a month and worsening. Does have an extensive history with plantar fasciitis in this foot in the past. Has been stretching, icing and taking tylenol. Relates in the past she has had orthotics that have helped. Currently wearing [place] but does a lot of walking and still getting pain.  . Denies any other pedal complaints. Denies n/v/f/c.   Past Medical History:  Diagnosis Date   Hypertension    Pneumonia    Thrombocytopenia ([place]) 11/15/2020    Objective:  Physical Exam: Vascular: DP/PT pulses 2/4 bilateral. CFT <3 seconds. Normal hair growth on digits. No edema.  Skin. No lacerations or abrasions bilateral feet.  Musculoskeletal: MMT 5/5 bilateral lower extremities in DF, PF, Inversion and Eversion. Deceased ROM in DF of ankle joint. Tender to medial calcaneal tubercle on the right. No pain along achilles or PT tendon. No pain with calcaneal squeeze.   Neurological: Sensation intact to light touch.   Assessment:   1. Plantar fasciitis, right      Plan:  Patient was evaluated and treated and all questions answered. Discussed plantar fasciitis with patient.  X-rays reviewed and discussed with patient. No acute fractures or dislocations noted. Mild spurring noted at inferior calcaneus.  Discussed treatment options including, ice, NSAIDS, supportive shoes, bracing, and stretching.  Continue stretching.  PF brace dispensed.  Fitted for custom orthotics. Will call when in.  Patient requesting injection today. Procedure note below.   Follow-up 6 weeks or sooner if any problems arise. In the meantime, encouraged to call the office with any questions, concerns, change in symptoms.    Procedure:  Discussed etiology, pathology, conservative vs. surgical therapies. At this time a plantar fascial injection was recommended.  The patient agreed and a sterile skin prep was applied.  An injection consisting of  dexamethasone and marcaine mixture was infiltrated at the point of maximal tenderness on the right Heel.  Bandaid applied. The patient tolerated this well and was given instructions for aftercare.     Lorenda Peck, DPM

## 2023-02-07 ENCOUNTER — Encounter: Payer: Self-pay | Admitting: Family Medicine

## 2023-02-11 ENCOUNTER — Other Ambulatory Visit: Payer: Self-pay | Admitting: Family Medicine

## 2023-02-11 DIAGNOSIS — E785 Hyperlipidemia, unspecified: Secondary | ICD-10-CM

## 2023-02-17 ENCOUNTER — Other Ambulatory Visit: Payer: Self-pay | Admitting: Pharmacist

## 2023-02-17 ENCOUNTER — Other Ambulatory Visit: Payer: Self-pay | Admitting: Family Medicine

## 2023-02-17 DIAGNOSIS — E785 Hyperlipidemia, unspecified: Secondary | ICD-10-CM

## 2023-02-17 MED ORDER — ROSUVASTATIN CALCIUM 10 MG PO TABS: 10.0000 mg | ORAL_TABLET | Freq: Every day | ORAL | 3 refills | Status: DC

## 2023-02-17 NOTE — Progress Notes (Signed)
Meds ordered this encounter  °Medications  ° rosuvastatin (CRESTOR) 10 MG tablet  °  Sig: Take 1 tablet (10 mg total) by mouth at bedtime.  °  Dispense:  90 tablet  °  Refill:  3  ° ° °

## 2023-02-17 NOTE — Progress Notes (Signed)
Patient appearing on report for [employer] - Hypertension Control report due to last documented ambulatory blood pressure of 166/50 on 10/24/22. Next appointment with PCP is not scheduled. Of note, patient has documented white coat HTN with controlled readings at home, per chart review.   Outreached patient to discuss hypertension control and medication management. Left voicemail for patient to return my call at their convenience.   Larinda Buttery, PharmD Clinical Pharmacist [facility] [phone]

## 2023-02-17 NOTE — Progress Notes (Signed)
Patient appearing on report for [employer] - Hypertension Control report due to last documented ambulatory blood pressure of 166/50 on 10/24/22. (White coat HTN, and patient also is in pain with her foot). Next appointment with PCP is not scheduled.   Outreached patient to discuss hypertension control and medication management.   Current antihypertensives: losartan '25mg'$  daily   Patient has an automated upper arm home BP machine. Taking once weekly, and reports no symptoms.  Current blood pressure readings: 131/64, 121/65, 120/60, 134/65, 121/64, 129/62   Current physical activity: limited by plantar fascitis pain, orthopedic inserts and working on  Patient denies hypotensive signs and symptoms including dizziness, lightheadedness.  Patient denies hypertensive symptoms including headache, chest pain, shortness of breath.    Assessment/Plan: - Currently controlled - - Discussed dietary modifications, such as reduced salt intake, focus on whole grains, vegetables, lean proteins - Discussed goal of 150 minutes of moderate intensity physical activity weekly - Recommend continue current regimen  Larinda Buttery, PharmD Clinical Pharmacist [facility] [phone]

## 2023-02-18 ENCOUNTER — Other Ambulatory Visit: Payer: Self-pay | Admitting: *Deleted

## 2023-02-18 DIAGNOSIS — I10 Essential (primary) hypertension: Secondary | ICD-10-CM

## 2023-02-18 MED ORDER — LOSARTAN POTASSIUM 25 MG PO TABS: 25.0000 mg | ORAL_TABLET | Freq: Every day | ORAL | 1 refills | Status: DC

## 2023-02-21 ENCOUNTER — Encounter: Payer: Self-pay | Admitting: Hematology & Oncology

## 2023-02-21 ENCOUNTER — Inpatient Hospital Stay (HOSPITAL_BASED_OUTPATIENT_CLINIC_OR_DEPARTMENT_OTHER): Payer: Medicare Other | Admitting: Hematology & Oncology

## 2023-02-21 ENCOUNTER — Inpatient Hospital Stay: Payer: Medicare Other | Attending: Hematology & Oncology

## 2023-02-21 VITALS — BP 168/62 | HR 50 | Temp 98.2°F | Resp 20 | Ht 63.0 in | Wt 175.0 lb

## 2023-02-21 DIAGNOSIS — M722 Plantar fascial fibromatosis: Secondary | ICD-10-CM | POA: Insufficient documentation

## 2023-02-21 DIAGNOSIS — Q2112 Patent foramen ovale: Secondary | ICD-10-CM | POA: Diagnosis not present

## 2023-02-21 DIAGNOSIS — Z7901 Long term (current) use of anticoagulants: Secondary | ICD-10-CM | POA: Diagnosis not present

## 2023-02-21 DIAGNOSIS — D696 Thrombocytopenia, unspecified: Secondary | ICD-10-CM | POA: Diagnosis not present

## 2023-02-21 DIAGNOSIS — I4891 Unspecified atrial fibrillation: Secondary | ICD-10-CM | POA: Insufficient documentation

## 2023-02-21 LAB — CBC WITH DIFFERENTIAL (CANCER CENTER ONLY)
Abs Immature Granulocytes: 0 10*3/uL (ref 0.00–0.07)
Basophils Absolute: 0 10*3/uL (ref 0.0–0.1)
Basophils Relative: 1 %
Eosinophils Absolute: 0.1 10*3/uL (ref 0.0–0.5)
Eosinophils Relative: 1 %
HCT: 40.3 % (ref 36.0–46.0)
Hemoglobin: 13 g/dL (ref 12.0–15.0)
Immature Granulocytes: 0 %
Lymphocytes Relative: 44 %
Lymphs Abs: 1.8 10*3/uL (ref 0.7–4.0)
MCH: 29.2 pg (ref 26.0–34.0)
MCHC: 32.3 g/dL (ref 30.0–36.0)
MCV: 90.6 fL (ref 80.0–100.0)
Monocytes Absolute: 0.5 10*3/uL (ref 0.1–1.0)
Monocytes Relative: 11 %
Neutro Abs: 1.8 10*3/uL (ref 1.7–7.7)
Neutrophils Relative %: 43 %
Platelet Count: 87 10*3/uL — ABNORMAL LOW (ref 150–400)
RBC: 4.45 MIL/uL (ref 3.87–5.11)
RDW: 13.8 % (ref 11.5–15.5)
WBC Count: 4.1 10*3/uL (ref 4.0–10.5)
nRBC: 0 % (ref 0.0–0.2)

## 2023-02-21 LAB — CMP (CANCER CENTER ONLY)
ALT: 15 U/L (ref 0–44)
AST: 26 U/L (ref 15–41)
Albumin: 4.3 g/dL (ref 3.5–5.0)
Alkaline Phosphatase: 62 U/L (ref 38–126)
Anion gap: 6 (ref 5–15)
BUN: 18 mg/dL (ref 8–23)
CO2: 30 mmol/L (ref 22–32)
Calcium: 9.5 mg/dL (ref 8.9–10.3)
Chloride: 104 mmol/L (ref 98–111)
Creatinine: 0.75 mg/dL (ref 0.44–1.00)
GFR, Estimated: >60 mL/min (ref >60)
Glucose, Bld: 81 mg/dL (ref 70–99)
Potassium: 4.1 mmol/L (ref 3.5–5.1)
Sodium: 140 mmol/L (ref 135–145)
Total Bilirubin: 0.6 mg/dL (ref 0.3–1.2)
Total Protein: 7.1 g/dL (ref 6.5–8.1)

## 2023-02-21 LAB — SAVE SMEAR(SSMR), FOR PROVIDER SLIDE REVIEW

## 2023-02-21 NOTE — Progress Notes (Signed)
Hematology and Oncology Follow Up Visit  Margaret Smith [id number] Jun 01, 1951 72 y.o. 02/21/2023   Principle Diagnosis:  Thrombocytopenia-likely mild ITP Atrial fibrillation  Current Therapy:   Observation Eliquis 5 mg p.o. twice daily     Interim History:  Margaret Smith is back for her follow-up.  Unfortunately, she now has atrial fibrillation.  She apparently developed this before she went on her [place] cruise.  She had a wonderful time while over in [place].  She is on Eliquis right now.  She does not go back to see the cardiologist until June.  She has had no problems with bleeding.  There is been no chest pain.  She has had no increased cough or shortness of breath.  She wants to try to walk.  She but unfortunately also developed plantar fasciitis in the right foot.  She does see a podiatrist.  I think she got an injection.  She really wants to walk.  Hopefully, she will be able to walk a little bit more.  She has had a good appetite.  Thankfully, she did not gain a lot of weight on a cruise.  She has had no change in bowel or bladder habits.  She has had no fever.  There is been no issues with COVID on her trip.  Overall, I would say that her performance status is probably ECOG 1.    Medications:  Current Outpatient Medications:    acetaminophen (TYLENOL 8 HOUR ARTHRITIS PAIN) 650 MG CR tablet, Take 650 mg by mouth every 8 (eight) hours as needed for pain., Disp: , Rfl:    apixaban (ELIQUIS) 5 MG TABS tablet, Take 5 mg by mouth 2 (two) times daily., Disp: , Rfl:    calcium carbonate (OSCAL) 1500 (600 Ca) MG TABS tablet, Take 1,500 mg by mouth daily., Disp: , Rfl:    losartan (COZAAR) 25 MG tablet, Take 1 tablet (25 mg total) by mouth daily., Disp: 90 tablet, Rfl: 1   rosuvastatin (CRESTOR) 10 MG tablet, Take 1 tablet (10 mg total) by mouth at bedtime., Disp: 90 tablet, Rfl: 3   traZODone (DESYREL) 50 MG tablet, Take 0.5-1 tablets (25-50 mg total) by mouth at bedtime as  needed for sleep., Disp: 30 tablet, Rfl: 3  Current Facility-Administered Medications:    dexamethasone (DECADRON) injection 4 mg, 4 mg, Intra-articular, Once, Lorenda Peck, DPM  Allergies: No Known Allergies  Past Medical History, Surgical history, Social history, and Family History were reviewed and updated.  Review of Systems: Review of Systems  Constitutional: Negative.   HENT:  Negative.    Eyes: Negative.   Respiratory: Negative.    Cardiovascular: Negative.   Gastrointestinal: Negative.   Endocrine: Negative.   Genitourinary: Negative.    Musculoskeletal: Negative.   Skin: Negative.   Neurological: Negative.   Hematological: Negative.   Psychiatric/Behavioral: Negative.      Physical Exam:  height is 5\' 3"  (1.6 m) and weight is 175 lb (79.4 kg). Her oral temperature is 98.2 F (36.8 C). Her blood pressure is 168/62 (abnormal) and her pulse is 50 (abnormal). Her respiration is 20 and oxygen saturation is 100%.   Wt Readings from Last 3 Encounters:  02/21/23 175 lb (79.4 kg)  10/24/22 172 lb (78 kg)  09/17/22 169 lb (76.7 kg)    Physical Exam Vitals reviewed.  HENT:     Head: Normocephalic and atraumatic.  Eyes:     Pupils: Pupils are equal, round, and reactive to light.  Cardiovascular:  Rate and Rhythm: Normal rate and regular rhythm.     Heart sounds: Normal heart sounds.  Pulmonary:     Effort: Pulmonary effort is normal.     Breath sounds: Normal breath sounds.  Abdominal:     General: Bowel sounds are normal.     Palpations: Abdomen is soft.  Musculoskeletal:        General: No tenderness or deformity. Normal range of motion.     Cervical back: Normal range of motion.  Lymphadenopathy:     Cervical: No cervical adenopathy.  Skin:    General: Skin is warm and dry.     Findings: No erythema or rash.  Neurological:     Mental Status: She is alert and oriented to person, place, and time.  Psychiatric:        Behavior: Behavior normal.         Thought Content: Thought content normal.        Judgment: Judgment normal.      Lab Results  Component Value Date   WBC 4.1 02/21/2023   HGB 13.0 02/21/2023   HCT 40.3 02/21/2023   MCV 90.6 02/21/2023   PLT 87 (L) 02/21/2023     Chemistry      Component Value Date/Time   NA 140 02/21/2023 0950   K 4.1 02/21/2023 0950   CL 104 02/21/2023 0950   CO2 30 02/21/2023 0950   BUN 18 02/21/2023 0950   CREATININE 0.75 02/21/2023 0950   CREATININE 0.70 10/24/2022 0000   GLU 85 04/12/2015 0000      Component Value Date/Time   CALCIUM 9.5 02/21/2023 0950   ALKPHOS 62 02/21/2023 0950   AST 26 02/21/2023 0950   ALT 15 02/21/2023 0950   BILITOT 0.6 02/21/2023 0950      Impression and Plan: Margaret Smith is a very nice 72 year old Afro-American female.  She is quite funny.  She used to work for the [employer].  I hate that she developed atrial fibrillation.  When I listen to her heart today, I cannot hear any irregular heart rhythm.  I suppose that this might be paroxysmal.  Apparently, when she had an echocardiogram she was found to have a PFO.  Again I am not sure what the cardiologist will do with this.  Her platelet count little bit lower.  However, I do not think is low enough that she cause her any problems with respect to being on Eliquis.  We will still follow along.  Will plan to get her back in another 4 months now.   Volanda Napoleon, MD 3/15/202411:22 AM

## 2023-03-11 ENCOUNTER — Other Ambulatory Visit: Payer: Self-pay | Admitting: Family Medicine

## 2023-03-14 ENCOUNTER — Ambulatory Visit (INDEPENDENT_AMBULATORY_CARE_PROVIDER_SITE_OTHER): Payer: Medicare Other | Admitting: Podiatry

## 2023-03-14 DIAGNOSIS — M722 Plantar fascial fibromatosis: Secondary | ICD-10-CM | POA: Diagnosis not present

## 2023-03-14 MED ORDER — DEXAMETHASONE SODIUM PHOSPHATE 120 MG/30ML IJ SOLN
4.0000 mg | Freq: Once | INTRAMUSCULAR | Status: AC
  Administered 2023-03-14: 4 mg via INTRA_ARTICULAR

## 2023-03-14 NOTE — Progress Notes (Signed)
  Subjective:  Patient ID: Margaret Smith, female    DOB: January 23, 1951,   MRN: [id number]  Chief Complaint  Patient presents with   Plantar Fasciitis    Patient states Pf is improving some     72 y.o. female presents for follow-up of plantar fasciitis. Relates it is doing about 70% better but still having pain now and then when on it and at the end of the day. Relates she has been stretching, wearing brace and wearing supportive shoes. Has not received the inserts yet.   . Denies any other pedal complaints. Denies n/v/f/c.   Past Medical History:  Diagnosis Date   Hypertension    Pneumonia    Thrombocytopenia (HCC) 11/15/2020    Objective:  Physical Exam: Vascular: DP/PT pulses 2/4 bilateral. CFT <3 seconds. Normal hair growth on digits. No edema.  Skin. No lacerations or abrasions bilateral feet.  Musculoskeletal: MMT 5/5 bilateral lower extremities in DF, PF, Inversion and Eversion. Deceased ROM in DF of ankle joint. Mildly tender to medial calcaneal tubercle on the right. No pain along achilles or PT tendon. No pain with calcaneal squeeze.   Neurological: Sensation intact to light touch.   Assessment:   1. Plantar fasciitis, right       Plan:  Patient was evaluated and treated and all questions answered. Discussed plantar fasciitis with patient.  X-rays reviewed and discussed with patient. No acute fractures or dislocations noted. Mild spurring noted at inferior calcaneus.  Discussed treatment options including, ice, NSAIDS, supportive shoes, bracing, and stretching.  Continue stretching.  Continue brace.  Awaiting orthtoics.  Patient requesting injection today. Procedure note below.   Follow-up 6 weeks or sooner if any problems arise. In the meantime, encouraged to call the office with any questions, concerns, change in symptoms.   Procedure:  Discussed etiology, pathology, conservative vs. surgical therapies. At this time a plantar fascial injection was recommended.  The  patient agreed and a sterile skin prep was applied.  An injection consisting of  dexamethasone and marcaine mixture was infiltrated at the point of maximal tenderness on the right Heel.  Bandaid applied. The patient tolerated this well and was given instructions for aftercare.     Louann Sjogren, DPM

## 2023-04-07 DIAGNOSIS — Z23 Encounter for immunization: Secondary | ICD-10-CM | POA: Diagnosis not present

## 2023-04-11 DIAGNOSIS — G4730 Sleep apnea, unspecified: Secondary | ICD-10-CM | POA: Diagnosis not present

## 2023-04-11 DIAGNOSIS — I4891 Unspecified atrial fibrillation: Secondary | ICD-10-CM | POA: Diagnosis not present

## 2023-04-11 DIAGNOSIS — I10 Essential (primary) hypertension: Secondary | ICD-10-CM | POA: Diagnosis not present

## 2023-04-11 DIAGNOSIS — G4769 Other sleep related movement disorders: Secondary | ICD-10-CM | POA: Diagnosis not present

## 2023-04-11 DIAGNOSIS — Z1330 Encounter for screening examination for mental health and behavioral disorders, unspecified: Secondary | ICD-10-CM | POA: Diagnosis not present

## 2023-04-11 DIAGNOSIS — Z6831 Body mass index (BMI) 31.0-31.9, adult: Secondary | ICD-10-CM | POA: Diagnosis not present

## 2023-04-11 DIAGNOSIS — E785 Hyperlipidemia, unspecified: Secondary | ICD-10-CM | POA: Diagnosis not present

## 2023-04-15 ENCOUNTER — Ambulatory Visit (INDEPENDENT_AMBULATORY_CARE_PROVIDER_SITE_OTHER): Payer: Medicare Other | Admitting: Family Medicine

## 2023-04-15 ENCOUNTER — Encounter: Payer: Self-pay | Admitting: Family Medicine

## 2023-04-15 VITALS — BP 126/64 | HR 55 | Ht 63.0 in | Wt 181.0 lb

## 2023-04-15 DIAGNOSIS — M25561 Pain in right knee: Secondary | ICD-10-CM | POA: Diagnosis not present

## 2023-04-15 DIAGNOSIS — L989 Disorder of the skin and subcutaneous tissue, unspecified: Secondary | ICD-10-CM

## 2023-04-15 DIAGNOSIS — M1711 Unilateral primary osteoarthritis, right knee: Secondary | ICD-10-CM

## 2023-04-15 NOTE — Progress Notes (Signed)
Established Patient Office Visit  Subjective   Patient ID: Margaret Smith, female    DOB: [birth year]  Age: 72 y.o. MRN: [id number]  Chief Complaint  Patient presents with   Knee Pain    HPI  She is here today because she has been experiencing right knee pain.  Over the weekend she just got out of the shower and was trying to get up and felt severe pain in that right knee to the point where it was difficult to walk she actually had to use her cane.  It did not give out on her but the pain was sharp and intense.  She has been having some plantar fasciitis issues with that right foot and has been waiting for her inserts to come in.  They will likely come in later this week but she has been waiting several weeks for them.  And earlier in the weekend she had been mowing the yard and did sort of tweaked her foot and had some pain but does not remember specifically injuring her knee.  No fall.  She has a prior history of osteoarthritis and received an injection back in 2019 with Dr. Benjamin Stain.  Has tried a couple of Tylenol and it did provide some relief.  Also reports skin lesion on the right side of the face near the temple she says it has been there for definitely greater than a year but she has been feeling it and noticing it more often.  And more recently it started bleeding.    ROS    Objective:     BP 126/64   Pulse (!) 55   Ht 5\' 3"  (1.6 m)   Wt 181 lb (82.1 kg)   SpO2 99%   BMI 32.06 kg/m    Physical Exam Musculoskeletal:     Comments: Right knee with normal flexion and extension, some medial/anterior swelling.  Nontender along the joint lines and over the patella.  No pain with varus or valgus stress.  Negative anterior drawer.      No results found for any visits on 04/15/23.    The 10-year ASCVD risk score (Arnett DK, et al., 2019) is: 10.5%    Assessment & Plan:   Problem List Items Addressed This Visit       Musculoskeletal and Integument   Primary  osteoarthritis of right knee, post left knee arthroplasty    Previously seen and injected with sports medicine back in 2021.  No recent trauma or fall but I suspect she has had some increased stress on that knee with having some right foot pain and problems and also mis-stepping in the yard while mowing.  We discussed conservative care with continued elevation and ice her pain has been getting a little bit better in the last day or 2.  If it gets worse then please let me know and we can always get an x-ray or refer back to sports med for further treatment options if needed she has known tricompartmental arthritis which is likely exacerbated.  Okay to continue to use Tylenol arthritis.      Other Visit Diagnoses     Acute pain of right knee    -  Primary   Skin lesion       Relevant Orders   Ambulatory referral to Dermatology      Skin lesion suspicious for squamous cell versus keratoacanthoma.  Recommend referral to dermatology for excision and biopsy.  No follow-ups on file.    Margaret Gasser, MD

## 2023-04-15 NOTE — Assessment & Plan Note (Addendum)
Previously seen and injected with sports medicine back in 2021.  No recent trauma or fall but I suspect she has had some increased stress on that knee with having some right foot pain and problems and also mis-stepping in the yard while mowing.  We discussed conservative care with continued elevation and ice her pain has been getting a little bit better in the last day or 2.  If it gets worse then please let me know and we can always get an x-ray or refer back to sports med for further treatment options if needed she has known tricompartmental arthritis which is likely exacerbated.  Okay to continue to use Tylenol arthritis.

## 2023-04-17 ENCOUNTER — Ambulatory Visit (INDEPENDENT_AMBULATORY_CARE_PROVIDER_SITE_OTHER): Payer: Medicare Other | Admitting: Podiatry

## 2023-04-17 DIAGNOSIS — M722 Plantar fascial fibromatosis: Secondary | ICD-10-CM

## 2023-04-17 NOTE — Progress Notes (Signed)
Patient came to the [place] office to pick up her orthotics. She didn't need to see Dr.Sikora today.

## 2023-04-20 DIAGNOSIS — G4730 Sleep apnea, unspecified: Secondary | ICD-10-CM | POA: Diagnosis not present

## 2023-04-20 DIAGNOSIS — I4891 Unspecified atrial fibrillation: Secondary | ICD-10-CM | POA: Diagnosis not present

## 2023-04-20 DIAGNOSIS — Z6831 Body mass index (BMI) 31.0-31.9, adult: Secondary | ICD-10-CM | POA: Diagnosis not present

## 2023-04-20 DIAGNOSIS — I10 Essential (primary) hypertension: Secondary | ICD-10-CM | POA: Diagnosis not present

## 2023-04-20 DIAGNOSIS — G4769 Other sleep related movement disorders: Secondary | ICD-10-CM | POA: Diagnosis not present

## 2023-04-20 DIAGNOSIS — G4733 Obstructive sleep apnea (adult) (pediatric): Secondary | ICD-10-CM | POA: Diagnosis not present

## 2023-04-21 DIAGNOSIS — L281 Prurigo nodularis: Secondary | ICD-10-CM | POA: Diagnosis not present

## 2023-04-25 ENCOUNTER — Encounter: Payer: Self-pay | Admitting: Podiatry

## 2023-04-25 ENCOUNTER — Ambulatory Visit (INDEPENDENT_AMBULATORY_CARE_PROVIDER_SITE_OTHER): Payer: Medicare Other | Admitting: Podiatry

## 2023-04-25 DIAGNOSIS — M722 Plantar fascial fibromatosis: Secondary | ICD-10-CM

## 2023-04-25 NOTE — Progress Notes (Signed)
  Subjective:  Patient ID: Margaret Smith, female    DOB: 04/17/1951,   MRN: [id number]  Chief Complaint  Patient presents with   Plantar Fasciitis    Right foot plantar fasciitis still improving      72 y.o. female presents for follow-up of plantar fasciitis. Relates it is doing about better. She has been starting to walk more and doing well. Occasional pain still but much better.  Overall she feels 100% better. Relates she has been stretching, wearing brace and wearing supportive shoes. Has been wearing the orthotics with relief.   . Denies any other pedal complaints. Denies n/v/f/c.   Past Medical History:  Diagnosis Date   Hypertension    Pneumonia    Thrombocytopenia (HCC) 11/15/2020    Objective:  Physical Exam: Vascular: DP/PT pulses 2/4 bilateral. CFT <3 seconds. Normal hair growth on digits. No edema.  Skin. No lacerations or abrasions bilateral feet.  Musculoskeletal: MMT 5/5 bilateral lower extremities in DF, PF, Inversion and Eversion. Deceased ROM in DF of ankle joint. No pain to medial calcaneal tubercle on the right. No pain along achilles or PT tendon. No pain with calcaneal squeeze.   Neurological: Sensation intact to light touch.   Assessment:   1. Plantar fasciitis, right        Plan:  Patient was evaluated and treated and all questions answered. Discussed plantar fasciitis with patient.  X-rays reviewed and discussed with patient. No acute fractures or dislocations noted. Mild spurring noted at inferior calcaneus.  Discussed treatment options including, ice, NSAIDS, supportive shoes, bracing, and stretching.  Continue stretching.  Continue orthotics.  No injection necessary today.  Follow-up as needed   Louann Sjogren, DPM

## 2023-05-14 DIAGNOSIS — D696 Thrombocytopenia, unspecified: Secondary | ICD-10-CM | POA: Diagnosis not present

## 2023-05-14 DIAGNOSIS — I4891 Unspecified atrial fibrillation: Secondary | ICD-10-CM | POA: Diagnosis not present

## 2023-05-14 DIAGNOSIS — I10 Essential (primary) hypertension: Secondary | ICD-10-CM | POA: Diagnosis not present

## 2023-05-16 ENCOUNTER — Other Ambulatory Visit: Payer: Self-pay | Admitting: Family Medicine

## 2023-06-15 ENCOUNTER — Other Ambulatory Visit: Payer: Self-pay | Admitting: Family Medicine

## 2023-06-15 DIAGNOSIS — I10 Essential (primary) hypertension: Secondary | ICD-10-CM

## 2023-06-23 ENCOUNTER — Encounter: Payer: Self-pay | Admitting: Family

## 2023-06-23 ENCOUNTER — Inpatient Hospital Stay (HOSPITAL_BASED_OUTPATIENT_CLINIC_OR_DEPARTMENT_OTHER): Payer: Medicare Other | Admitting: Family

## 2023-06-23 ENCOUNTER — Inpatient Hospital Stay: Payer: Medicare Other | Attending: Hematology & Oncology

## 2023-06-23 VITALS — BP 148/55 | HR 62 | Temp 98.4°F | Resp 17 | Wt 190.0 lb

## 2023-06-23 DIAGNOSIS — Z7901 Long term (current) use of anticoagulants: Secondary | ICD-10-CM | POA: Diagnosis not present

## 2023-06-23 DIAGNOSIS — I4891 Unspecified atrial fibrillation: Secondary | ICD-10-CM | POA: Diagnosis not present

## 2023-06-23 DIAGNOSIS — D696 Thrombocytopenia, unspecified: Secondary | ICD-10-CM

## 2023-06-23 LAB — CMP (CANCER CENTER ONLY)
ALT: 28 U/L (ref 0–44)
AST: 31 U/L (ref 15–41)
Albumin: 4.2 g/dL (ref 3.5–5.0)
Alkaline Phosphatase: 64 U/L (ref 38–126)
Anion gap: 7 (ref 5–15)
BUN: 16 mg/dL (ref 8–23)
CO2: 28 mmol/L (ref 22–32)
Calcium: 9.7 mg/dL (ref 8.9–10.3)
Chloride: 108 mmol/L (ref 98–111)
Creatinine: 0.76 mg/dL (ref 0.44–1.00)
GFR, Estimated: >60 mL/min (ref >60)
Glucose, Bld: 86 mg/dL (ref 70–99)
Potassium: 5 mmol/L (ref 3.5–5.1)
Sodium: 143 mmol/L (ref 135–145)
Total Bilirubin: 0.6 mg/dL (ref 0.3–1.2)
Total Protein: 6.9 g/dL (ref 6.5–8.1)

## 2023-06-23 LAB — CBC WITH DIFFERENTIAL (CANCER CENTER ONLY)
Abs Immature Granulocytes: 0.01 10*3/uL (ref 0.00–0.07)
Basophils Absolute: 0 10*3/uL (ref 0.0–0.1)
Basophils Relative: 0 %
Eosinophils Absolute: 0.1 10*3/uL (ref 0.0–0.5)
Eosinophils Relative: 1 %
HCT: 36.7 % (ref 36.0–46.0)
Hemoglobin: 11.9 g/dL — ABNORMAL LOW (ref 12.0–15.0)
Immature Granulocytes: 0 %
Lymphocytes Relative: 32 %
Lymphs Abs: 1.9 10*3/uL (ref 0.7–4.0)
MCH: 29.8 pg (ref 26.0–34.0)
MCHC: 32.4 g/dL (ref 30.0–36.0)
MCV: 92 fL (ref 80.0–100.0)
Monocytes Absolute: 0.7 10*3/uL (ref 0.1–1.0)
Monocytes Relative: 11 %
Neutro Abs: 3.4 10*3/uL (ref 1.7–7.7)
Neutrophils Relative %: 56 %
Platelet Count: 97 10*3/uL — ABNORMAL LOW (ref 150–400)
RBC: 3.99 MIL/uL (ref 3.87–5.11)
RDW: 14.2 % (ref 11.5–15.5)
WBC Count: 6 10*3/uL (ref 4.0–10.5)
nRBC: 0 % (ref 0.0–0.2)

## 2023-06-23 LAB — LACTATE DEHYDROGENASE: LDH: 230 U/L — ABNORMAL HIGH (ref 98–192)

## 2023-06-23 LAB — SAVE SMEAR(SSMR), FOR PROVIDER SLIDE REVIEW

## 2023-06-23 NOTE — Progress Notes (Signed)
Hematology and Oncology Follow Up Visit  Margaret Smith [id number] 1951/04/25 72 y.o. 06/23/2023   Principle Diagnosis:  Thrombocytopenia-likely mild ITP Atrial fibrillation   Current Therapy:        Observation Eliquis 5 mg p.o. twice daily  Interim History:  Margaret Smith is here today for follow-up. She is doing well. She had a fun week visiting with a friend that was in from [place].  They had a great time trying some new restaurants. She states that she has had some puffiness in her feet and ankles with the salt intake. This appears t have almost completely resolved. No redness or pitting edema.  Platelets are stable at 97.  She has not noted any blood loss. No abnormal bruising or petechiae.  No issue with infections. No fever, chills, n/v, cough, rash, dizziness, SOB, chest pain, palpitations, abdominal pain or changes in bowel or bladder habits.  She was able to see her cardiologist Dr. Eddie Candle on 6/5 and was referred to electrophysiology for further evaluation for placement of a Watchman device for atrial fib.   No falls or syncope reported.  Appetite and hydration are good. Weight is 190 lbs.  She is walking for exercise.   ECOG Performance Status: 1 - Symptomatic but completely ambulatory  Medications:  Allergies as of 06/23/2023   No Known Allergies      Medication List        Accurate as of June 23, 2023  1:31 PM. If you have any questions, ask your nurse or doctor.          apixaban 5 MG Tabs tablet Commonly known as: ELIQUIS Take 5 mg by mouth 2 (two) times daily.   calcium carbonate 1500 (600 Ca) MG Tabs tablet Commonly known as: OSCAL Take 1,500 mg by mouth daily.   losartan 25 MG tablet Commonly known as: COZAAR TAKE ONE TABLET ONCE DAILY   pramipexole 0.5 MG tablet Commonly known as: MIRAPEX Take 0.5 mg by mouth at bedtime.   rosuvastatin 10 MG tablet Commonly known as: CRESTOR Take 1 tablet (10 mg total) by mouth at bedtime.   traZODone 50  MG tablet Commonly known as: DESYREL TAKE 1/2 TO 1 TABLET BY MOUTH AT BEDTIME AS NEEDED FOR SLEEP.   Tylenol 8 Hour Arthritis Pain 650 MG CR tablet Generic drug: acetaminophen Take 650 mg by mouth every 8 (eight) hours as needed for pain.        Allergies: No Known Allergies  Past Medical History, Surgical history, Social history, and Family History were reviewed and updated.  Review of Systems: All other 10 point review of systems is negative.   Physical Exam:  weight is 190 lb (86.2 kg). Her oral temperature is 98.4 F (36.9 C). Her blood pressure is 160/54 (abnormal) and her pulse is 62. Her respiration is 17 and oxygen saturation is 100%.   Wt Readings from Last 3 Encounters:  06/23/23 190 lb (86.2 kg)  04/15/23 181 lb (82.1 kg)  02/21/23 175 lb (79.4 kg)    Ocular: Sclerae unicteric, pupils equal, round and reactive to light Ear-nose-throat: Oropharynx clear, dentition fair Lymphatic: No cervical or supraclavicular adenopathy Lungs no rales or rhonchi, good excursion bilaterally Heart regular rate and rhythm, no murmur appreciated Abd soft, nontender, positive bowel sounds MSK no focal spinal tenderness, no joint edema Neuro: non-focal, well-oriented, appropriate affect Breasts: Deferred   Lab Results  Component Value Date   WBC 6.0 06/23/2023   HGB 11.9 (L) 06/23/2023   HCT 36.7  06/23/2023   MCV 92.0 06/23/2023   PLT 97 (L) 06/23/2023   No results found for: "FERRITIN", "IRON", "TIBC", "UIBC", "IRONPCTSAT" Lab Results  Component Value Date   RBC 3.99 06/23/2023   No results found for: "KPAFRELGTCHN", "LAMBDASER", "KAPLAMBRATIO" No results found for: "IGGSERUM", "IGA", "IGMSERUM" No results found for: "TOTALPROTELP", "ALBUMINELP", "A1GS", "A2GS", "BETS", "BETA2SER", "GAMS", "MSPIKE", "SPEI"   Chemistry      Component Value Date/Time   NA 140 02/21/2023 0950   K 4.1 02/21/2023 0950   CL 104 02/21/2023 0950   CO2 30 02/21/2023 0950   BUN 18 02/21/2023  0950   CREATININE 0.75 02/21/2023 0950   CREATININE 0.70 10/24/2022 0000   GLU 85 04/12/2015 0000      Component Value Date/Time   CALCIUM 9.5 02/21/2023 0950   ALKPHOS 62 02/21/2023 0950   AST 26 02/21/2023 0950   ALT 15 02/21/2023 0950   BILITOT 0.6 02/21/2023 0950       Impression and Plan: Margaret Smith is a pleasant 72 yo African American female with history of mild thrombocytopenia as well as atrial fib and is currently on Eliquis.  Her platelets are holding steady at 97, Hgb is 11.9, MCV 92 and WBC count is 6.0.  No intervention needed at this time.  CBC and CMP were routed to cardiologist Dr. Jackquline Denmark.  Follow-up in another 4 months.   Eileen Stanford, NP 7/15/20241:31 PM

## 2023-07-17 ENCOUNTER — Telehealth: Payer: Self-pay | Admitting: Family Medicine

## 2023-07-17 NOTE — Telephone Encounter (Signed)
Cardiologist office called needed a EKG showing Afib. Should be sent via direct fax [phone]

## 2023-07-17 NOTE — Telephone Encounter (Signed)
Information sent to cardiologist office.

## 2023-07-21 DIAGNOSIS — I480 Paroxysmal atrial fibrillation: Secondary | ICD-10-CM | POA: Diagnosis not present

## 2023-07-21 DIAGNOSIS — D696 Thrombocytopenia, unspecified: Secondary | ICD-10-CM | POA: Diagnosis not present

## 2023-07-22 DIAGNOSIS — D6959 Other secondary thrombocytopenia: Secondary | ICD-10-CM | POA: Diagnosis present

## 2023-07-22 DIAGNOSIS — Z01812 Encounter for preprocedural laboratory examination: Secondary | ICD-10-CM | POA: Diagnosis not present

## 2023-07-22 DIAGNOSIS — Z87891 Personal history of nicotine dependence: Secondary | ICD-10-CM | POA: Diagnosis not present

## 2023-07-22 DIAGNOSIS — Z01818 Encounter for other preprocedural examination: Secondary | ICD-10-CM | POA: Diagnosis not present

## 2023-07-22 DIAGNOSIS — I700 Atherosclerosis of aorta: Secondary | ICD-10-CM | POA: Diagnosis not present

## 2023-07-22 DIAGNOSIS — Z006 Encounter for examination for normal comparison and control in clinical research program: Secondary | ICD-10-CM | POA: Diagnosis not present

## 2023-07-22 DIAGNOSIS — I4891 Unspecified atrial fibrillation: Secondary | ICD-10-CM | POA: Diagnosis not present

## 2023-07-22 DIAGNOSIS — G4733 Obstructive sleep apnea (adult) (pediatric): Secondary | ICD-10-CM | POA: Diagnosis present

## 2023-07-22 DIAGNOSIS — Z6833 Body mass index (BMI) 33.0-33.9, adult: Secondary | ICD-10-CM | POA: Diagnosis not present

## 2023-07-22 DIAGNOSIS — R001 Bradycardia, unspecified: Secondary | ICD-10-CM | POA: Diagnosis present

## 2023-07-22 DIAGNOSIS — Z01810 Encounter for preprocedural cardiovascular examination: Secondary | ICD-10-CM | POA: Diagnosis not present

## 2023-07-22 DIAGNOSIS — Z79899 Other long term (current) drug therapy: Secondary | ICD-10-CM | POA: Diagnosis not present

## 2023-07-22 DIAGNOSIS — I10 Essential (primary) hypertension: Secondary | ICD-10-CM | POA: Diagnosis present

## 2023-07-22 DIAGNOSIS — G2581 Restless legs syndrome: Secondary | ICD-10-CM | POA: Diagnosis not present

## 2023-07-22 DIAGNOSIS — Z7901 Long term (current) use of anticoagulants: Secondary | ICD-10-CM | POA: Diagnosis not present

## 2023-07-22 DIAGNOSIS — G4730 Sleep apnea, unspecified: Secondary | ICD-10-CM | POA: Diagnosis not present

## 2023-07-22 DIAGNOSIS — I517 Cardiomegaly: Secondary | ICD-10-CM | POA: Diagnosis not present

## 2023-07-22 DIAGNOSIS — D696 Thrombocytopenia, unspecified: Secondary | ICD-10-CM | POA: Diagnosis not present

## 2023-07-22 DIAGNOSIS — I480 Paroxysmal atrial fibrillation: Secondary | ICD-10-CM | POA: Diagnosis not present

## 2023-07-22 DIAGNOSIS — I361 Nonrheumatic tricuspid (valve) insufficiency: Secondary | ICD-10-CM | POA: Diagnosis present

## 2023-07-22 DIAGNOSIS — E669 Obesity, unspecified: Secondary | ICD-10-CM | POA: Diagnosis present

## 2023-07-22 DIAGNOSIS — I340 Nonrheumatic mitral (valve) insufficiency: Secondary | ICD-10-CM | POA: Diagnosis present

## 2023-07-22 DIAGNOSIS — Z95818 Presence of other cardiac implants and grafts: Secondary | ICD-10-CM | POA: Diagnosis not present

## 2023-07-23 DIAGNOSIS — I10 Essential (primary) hypertension: Secondary | ICD-10-CM | POA: Diagnosis not present

## 2023-07-23 DIAGNOSIS — I4891 Unspecified atrial fibrillation: Secondary | ICD-10-CM | POA: Diagnosis not present

## 2023-07-23 DIAGNOSIS — G2581 Restless legs syndrome: Secondary | ICD-10-CM | POA: Diagnosis not present

## 2023-07-23 DIAGNOSIS — G4733 Obstructive sleep apnea (adult) (pediatric): Secondary | ICD-10-CM | POA: Diagnosis not present

## 2023-07-24 DIAGNOSIS — D6959 Other secondary thrombocytopenia: Secondary | ICD-10-CM | POA: Diagnosis present

## 2023-07-24 DIAGNOSIS — I480 Paroxysmal atrial fibrillation: Secondary | ICD-10-CM | POA: Diagnosis present

## 2023-07-24 DIAGNOSIS — I361 Nonrheumatic tricuspid (valve) insufficiency: Secondary | ICD-10-CM | POA: Diagnosis present

## 2023-07-24 DIAGNOSIS — Z95818 Presence of other cardiac implants and grafts: Secondary | ICD-10-CM | POA: Diagnosis not present

## 2023-07-24 DIAGNOSIS — I10 Essential (primary) hypertension: Secondary | ICD-10-CM | POA: Diagnosis present

## 2023-07-24 DIAGNOSIS — Z87891 Personal history of nicotine dependence: Secondary | ICD-10-CM | POA: Diagnosis not present

## 2023-07-24 DIAGNOSIS — Z6833 Body mass index (BMI) 33.0-33.9, adult: Secondary | ICD-10-CM | POA: Diagnosis not present

## 2023-07-24 DIAGNOSIS — G4730 Sleep apnea, unspecified: Secondary | ICD-10-CM | POA: Diagnosis not present

## 2023-07-24 DIAGNOSIS — Z7901 Long term (current) use of anticoagulants: Secondary | ICD-10-CM | POA: Diagnosis not present

## 2023-07-24 DIAGNOSIS — R001 Bradycardia, unspecified: Secondary | ICD-10-CM | POA: Diagnosis present

## 2023-07-24 DIAGNOSIS — D696 Thrombocytopenia, unspecified: Secondary | ICD-10-CM | POA: Diagnosis not present

## 2023-07-24 DIAGNOSIS — Z79899 Other long term (current) drug therapy: Secondary | ICD-10-CM | POA: Diagnosis not present

## 2023-07-24 DIAGNOSIS — G4733 Obstructive sleep apnea (adult) (pediatric): Secondary | ICD-10-CM | POA: Diagnosis present

## 2023-07-24 DIAGNOSIS — E669 Obesity, unspecified: Secondary | ICD-10-CM | POA: Diagnosis present

## 2023-07-24 DIAGNOSIS — Z006 Encounter for examination for normal comparison and control in clinical research program: Secondary | ICD-10-CM | POA: Diagnosis not present

## 2023-07-24 DIAGNOSIS — I340 Nonrheumatic mitral (valve) insufficiency: Secondary | ICD-10-CM | POA: Diagnosis present

## 2023-07-25 DIAGNOSIS — Z95818 Presence of other cardiac implants and grafts: Secondary | ICD-10-CM | POA: Diagnosis not present

## 2023-07-25 DIAGNOSIS — I480 Paroxysmal atrial fibrillation: Secondary | ICD-10-CM | POA: Diagnosis not present

## 2023-07-28 ENCOUNTER — Encounter: Payer: Self-pay | Admitting: *Deleted

## 2023-07-28 ENCOUNTER — Telehealth: Payer: Self-pay | Admitting: *Deleted

## 2023-07-28 NOTE — Transitions of Care (Post Inpatient/ED Visit) (Signed)
07/28/2023  Name: Margaret Smith MRN: [id number] DOB: 02-10-1951  Today's TOC FU Call Status: Today's TOC FU Call Status:: Successful TOC FU Call Completed TOC FU Call Complete Date: 07/28/23  Transition Care Management Follow-up Telephone Call Date of Discharge: 07/25/23 Discharge Facility: Other ([facility]) Name of Other ([facility]) Discharge Facility: [facility] Type of Discharge: Inpatient Admission Primary Inpatient Discharge Diagnosis:: Paroxysmal AF- Watchman's Device insertion How have you been since you were released from the hospital?: Better ("I am doing fine- back to my normal self; taking the ASA like they told me to.  I live alone and and am very independent") Any questions or concerns?: No  Items Reviewed: Did you receive and understand the discharge instructions provided?: Yes (briefly reviewed with patient who verbalizes good understanding of same - outside hospital AVS) Medications obtained,verified, and reconciled?: Yes (Medications Reviewed) (Full medication reconciliation/ review completed; no concerns or discrepancies identified; confirmed patient obtained/ is taking all newly Rx'd medications as instructed; self-manages medications and denies questions/ concerns around medications today) Any new allergies since your discharge?: No Dietary orders reviewed?: Yes Type of Diet Ordered:: Heart Healthy, low salt Do you have support at home?: Yes People in Home: alone Name of Support/Comfort Primary Source: Reports resides alone/  independent in self-care activities; supportive extended family assists as/ if needed/ indicated  Medications Reviewed Today: Medications Reviewed Today     Reviewed by Michaela Corner, RN (Registered Nurse) on 07/28/23 at 1305  Med List Status: <None>   Medication Order Taking? Sig Documenting Provider Last Dose Status Informant  acetaminophen (TYLENOL 8 HOUR ARTHRITIS PAIN) 650 MG CR tablet [id number] Yes Take 650 mg by mouth every 8  (eight) hours as needed for pain. [provider] Taking Active Self  apixaban (ELIQUIS) 5 MG TABS tablet [id number] Yes Take 5 mg by mouth 2 (two) times daily. [provider] Taking Active   aspirin EC 81 MG tablet [id number] Yes Take 81 mg by mouth daily. Swallow whole. Agapito Games, MD Taking Active Self           Med Note Marilu Favre Jul 28, 2023 12:59 PM) 07/28/23: Reports during [facility] call this was prescribed on 07/25/23 discharge from [facility]- post-Watchman's device implant: reports taking as prescribed  calcium carbonate (OSCAL) 1500 (600 Ca) MG TABS tablet [id number] Yes Take 1,500 mg by mouth daily. [provider] Taking Active   dexamethasone (DECADRON) injection 4 mg [id number]   Louann Sjogren, DPM  Active   losartan (COZAAR) 25 MG tablet [id number] Yes TAKE ONE TABLET ONCE DAILY Agapito Games, MD Taking Active   pramipexole (MIRAPEX) 0.5 MG tablet [id number] Yes Take 0.5 mg by mouth at bedtime. [provider] Taking Active   rosuvastatin (CRESTOR) 10 MG tablet [id number] Yes Take 1 tablet (10 mg total) by mouth at bedtime. Agapito Games, MD Taking Active   traZODone (DESYREL) 50 MG tablet [id number] Yes TAKE 1/2 TO 1 TABLET BY MOUTH AT BEDTIME AS NEEDED FOR SLEEP. Agapito Games, MD Taking Active            Home Care and Equipment/Supplies: Were Home Health Services Ordered?: No Any new equipment or medical supplies ordered?: No  Functional Questionnaire: Do you need assistance with bathing/showering or dressing?: No Do you need assistance with meal preparation?: No Do you need assistance with eating?: No Do you have difficulty maintaining continence: No Do you need assistance with getting out of bed/getting out of a chair/moving?: No Do  you have difficulty managing or taking your medications?: No  Follow up appointments reviewed: PCP Follow-up appointment confirmed?: Yes Date of PCP follow-up  appointment?: 07/31/23 Follow-up Provider: PCP Specialist Hospital Follow-up appointment confirmed?: Yes Date of Specialist follow-up appointment?: 08/05/23 Follow-Up Specialty Provider:: cardiologist- at [facility] Do you need transportation to your follow-up appointment?: No Do you understand care options if your condition(s) worsen?: Yes-patient verbalized understanding  SDOH Interventions Today    Flowsheet Row Most Recent Value  SDOH Interventions   Food Insecurity Interventions Intervention Not Indicated  Transportation Interventions Intervention Not Indicated  [drives self,  family assists as/ if needed]      TOC Interventions Today    Flowsheet Row Most Recent Value  TOC Interventions   TOC Interventions Discussed/Reviewed TOC Interventions Discussed, Post op wound/incision care  [Patient declines need for ongoing/ further care coordination outreach,  no care coordination needs identified at time of TOC call today]      Interventions Today    Flowsheet Row Most Recent Value  Chronic Disease   Chronic disease during today's visit Other, Atrial Fibrillation (AFib)  [Watchman's Device implant]  General Interventions   General Interventions Discussed/Reviewed General Interventions Discussed, Durable Medical Equipment (DME), Doctor Visits  Doctor Visits Discussed/Reviewed Doctor Visits Discussed, PCP, Specialist  Durable Medical Equipment (DME) Other  [confirmed not currently requiring/ using assistive devices]  PCP/Specialist Visits Compliance with follow-up visit  Exercise Interventions   Exercise Discussed/Reviewed Exercise Discussed  [Reports she walks for exercise several times per week at baseline]  Education Interventions   Education Provided Provided Education  Provided Verbal Education On Nutrition  [decreasing salt intake]  Nutrition Interventions   Nutrition Discussed/Reviewed Nutrition Discussed, Decreasing salt  Pharmacy Interventions   Pharmacy  Dicussed/Reviewed Pharmacy Topics Discussed  [Full medication review with updating medication list in EHR per patient report]  Safety Interventions   Safety Discussed/Reviewed Safety Discussed      Caryl Pina, RN, BSN, CCRN Alumnus RN CM Care Coordination/ Transition of Care- [facility] Care Management [phone]: direct office

## 2023-07-31 ENCOUNTER — Encounter: Payer: Self-pay | Admitting: Family Medicine

## 2023-07-31 ENCOUNTER — Ambulatory Visit (INDEPENDENT_AMBULATORY_CARE_PROVIDER_SITE_OTHER): Payer: Medicare Other | Admitting: Family Medicine

## 2023-07-31 VITALS — BP 128/64 | HR 56 | Ht 63.0 in | Wt 183.0 lb

## 2023-07-31 DIAGNOSIS — Z23 Encounter for immunization: Secondary | ICD-10-CM

## 2023-07-31 DIAGNOSIS — I480 Paroxysmal atrial fibrillation: Secondary | ICD-10-CM

## 2023-07-31 DIAGNOSIS — I10 Essential (primary) hypertension: Secondary | ICD-10-CM | POA: Diagnosis not present

## 2023-07-31 DIAGNOSIS — K219 Gastro-esophageal reflux disease without esophagitis: Secondary | ICD-10-CM | POA: Diagnosis not present

## 2023-07-31 DIAGNOSIS — D696 Thrombocytopenia, unspecified: Secondary | ICD-10-CM

## 2023-07-31 MED ORDER — PANTOPRAZOLE SODIUM 40 MG PO TBEC
40.0000 mg | DELAYED_RELEASE_TABLET | Freq: Every day | ORAL | 1 refills | Status: DC
Start: 2023-07-31 — End: 2024-01-12

## 2023-07-31 NOTE — Assessment & Plan Note (Signed)
Still follows regularly with oncology.

## 2023-07-31 NOTE — Assessment & Plan Note (Signed)
Well controlled. Continue current regimen. Follow up in  6 mo  

## 2023-07-31 NOTE — Progress Notes (Signed)
Established Patient Office Visit  Subjective   Patient ID: Margaret Smith, female    DOB: 1951/01/11  Age: 72 y.o. MRN: [id number]  Chief Complaint  Patient presents with   Follow-up         HPI  Today to follow-up from the cardiologist.  She has a diagnosis of paroxysmal atrial fibrillation and had a watchman placed on August 15 with Dr. Mayme Genta.  She has another transient esophageal echocardiogram scheduled for October 1.  The hospital they did labs.  Hemoglobin A1c looked fantastic at 5.4..  Platelets dropped slightly during the hospitalization and normally she runs around 90,000 and they did drop to 77,000.  Hypertension- Pt denies chest pain, SOB, dizziness, or heart palpitations.  Taking meds as directed w/o problems.  Denies medication side effects.    She did want to let me know that right after her procedure she had pain in her right big toe for about 2 days.  It felt stiff and sore but she never noticed any redness or swelling and it did go away after about 2 days.  Also while she was in the hospital her heart rate had actually dropped into the 30s.  She is been monitoring it very closely while she has been home and it has been more in the upper 40s according to her Fitbit at night send she has been home.  Read on her paperwork that she is not supposed to take garlic or fish oil and she does put garlic on her food so wanted to discuss that today.  She also reports that almost daily she has been getting a lot of pressure in her epigastric area.  She does not usually belch but if she does she does get relief.  She is tried taking Gas-X but that does not really help.  She did mention it to the cardiologist but they would did not feel like it was cardiac.     ROS    Objective:     BP 128/64 (BP Location: Left Arm, Cuff Size: Normal)   Pulse (!) 56   Ht 5\' 3"  (1.6 m)   Wt 183 lb (83 kg)   SpO2 100%   BMI 32.42 kg/m    Physical Exam Vitals and nursing note  reviewed.  Constitutional:      Appearance: Normal appearance.  HENT:     Head: Normocephalic and atraumatic.  Eyes:     Conjunctiva/sclera: Conjunctivae normal.  Cardiovascular:     Rate and Rhythm: Normal rate and regular rhythm.  Pulmonary:     Effort: Pulmonary effort is normal.     Breath sounds: Normal breath sounds.  Skin:    General: Skin is warm and dry.     Comments: Incisions near the groin appear to be healing really well.  She does have significant amount of bruising.  Neurological:     Mental Status: She is alert.  Psychiatric:        Mood and Affect: Mood normal.      No results found for any visits on 07/31/23.    The 10-year ASCVD risk score (Arnett DK, et al., 2019) is: 10.8%    Assessment & Plan:   Problem List Items Addressed This Visit       Cardiovascular and Mediastinum   Primary hypertension - Primary    Well controlled. Continue current regimen. Follow up in  8mo       Atrial fibrillation (HCC)    Min device placed.  She  did bring [place] in a copy of the device model and information to scanned into the chart.        Hematopoietic and Hemostatic   Thrombocytopenia (HCC) (Chronic)    Still follows regularly with oncology.      Other Visit Diagnoses     Gastroesophageal reflux disease, unspecified whether esophagitis present       Relevant Medications   pantoprazole (PROTONIX) 40 MG tablet       GERD -her symptoms are doing most daily.  Will do a trial of a PPI if helpful we will treat for a full 6 weeks and then taper off.  Also handout provided about food choices in regards to symptom control.   Return in about 6 months (around 01/31/2024).    Nani Gasser, MD

## 2023-07-31 NOTE — Patient Instructions (Signed)
Ok to use a scar cream if you would like

## 2023-07-31 NOTE — Assessment & Plan Note (Signed)
Min device placed.  She did bring [place] in a copy of the device model and information to scanned into the chart.

## 2023-08-05 DIAGNOSIS — I480 Paroxysmal atrial fibrillation: Secondary | ICD-10-CM | POA: Diagnosis not present

## 2023-08-05 DIAGNOSIS — D696 Thrombocytopenia, unspecified: Secondary | ICD-10-CM | POA: Diagnosis not present

## 2023-08-22 DIAGNOSIS — Z23 Encounter for immunization: Secondary | ICD-10-CM | POA: Diagnosis not present

## 2023-09-02 DIAGNOSIS — I480 Paroxysmal atrial fibrillation: Secondary | ICD-10-CM | POA: Diagnosis not present

## 2023-09-02 DIAGNOSIS — I10 Essential (primary) hypertension: Secondary | ICD-10-CM | POA: Diagnosis not present

## 2023-09-02 DIAGNOSIS — D696 Thrombocytopenia, unspecified: Secondary | ICD-10-CM | POA: Diagnosis not present

## 2023-09-05 ENCOUNTER — Other Ambulatory Visit: Payer: Self-pay | Admitting: Family Medicine

## 2023-09-09 DIAGNOSIS — G4733 Obstructive sleep apnea (adult) (pediatric): Secondary | ICD-10-CM | POA: Diagnosis not present

## 2023-09-09 DIAGNOSIS — Z6833 Body mass index (BMI) 33.0-33.9, adult: Secondary | ICD-10-CM | POA: Diagnosis not present

## 2023-09-09 DIAGNOSIS — Z7982 Long term (current) use of aspirin: Secondary | ICD-10-CM | POA: Diagnosis not present

## 2023-09-09 DIAGNOSIS — D696 Thrombocytopenia, unspecified: Secondary | ICD-10-CM | POA: Diagnosis not present

## 2023-09-09 DIAGNOSIS — Z95811 Presence of heart assist device: Secondary | ICD-10-CM | POA: Diagnosis not present

## 2023-09-09 DIAGNOSIS — I480 Paroxysmal atrial fibrillation: Secondary | ICD-10-CM | POA: Diagnosis not present

## 2023-09-09 DIAGNOSIS — E669 Obesity, unspecified: Secondary | ICD-10-CM | POA: Diagnosis not present

## 2023-09-09 DIAGNOSIS — Z95818 Presence of other cardiac implants and grafts: Secondary | ICD-10-CM | POA: Diagnosis not present

## 2023-09-09 DIAGNOSIS — Z7901 Long term (current) use of anticoagulants: Secondary | ICD-10-CM | POA: Diagnosis not present

## 2023-09-09 DIAGNOSIS — G4730 Sleep apnea, unspecified: Secondary | ICD-10-CM | POA: Diagnosis not present

## 2023-09-09 DIAGNOSIS — M1990 Unspecified osteoarthritis, unspecified site: Secondary | ICD-10-CM | POA: Diagnosis not present

## 2023-09-09 DIAGNOSIS — I10 Essential (primary) hypertension: Secondary | ICD-10-CM | POA: Diagnosis not present

## 2023-09-09 DIAGNOSIS — Z87891 Personal history of nicotine dependence: Secondary | ICD-10-CM | POA: Diagnosis not present

## 2023-09-09 DIAGNOSIS — I081 Rheumatic disorders of both mitral and tricuspid valves: Secondary | ICD-10-CM | POA: Diagnosis not present

## 2023-09-18 DIAGNOSIS — H6123 Impacted cerumen, bilateral: Secondary | ICD-10-CM | POA: Diagnosis not present

## 2023-09-18 DIAGNOSIS — K118 Other diseases of salivary glands: Secondary | ICD-10-CM | POA: Diagnosis not present

## 2023-10-07 IMAGING — MG MM DIGITAL SCREENING BILAT W/ TOMO AND CAD
8 series · 8 of 24 positions shown · non-contrast
Comparison: Previous exam(s).

CLINICAL DATA: Screening.

EXAM:
DIGITAL SCREENING BILATERAL MAMMOGRAM WITH TOMOSYNTHESIS AND CAD
TECHNIQUE: Bilateral screening digital craniocaudal and mediolateral oblique
mammograms were obtained. Bilateral screening digital breast
tomosynthesis was performed. The images were evaluated with
computer-aided detection.

[L CC synth-2D]
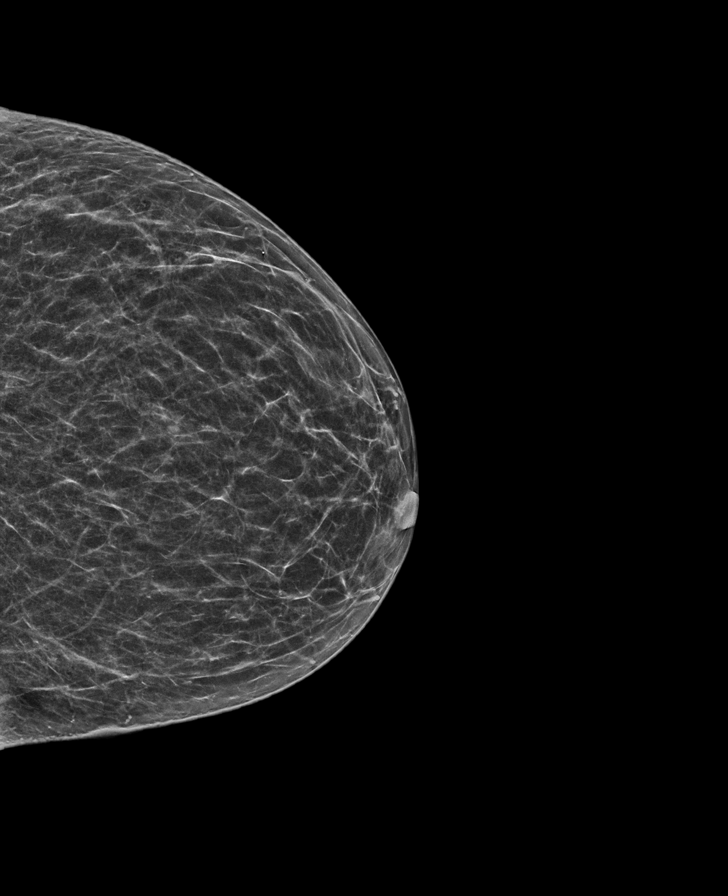

[R MLO synth-2D]
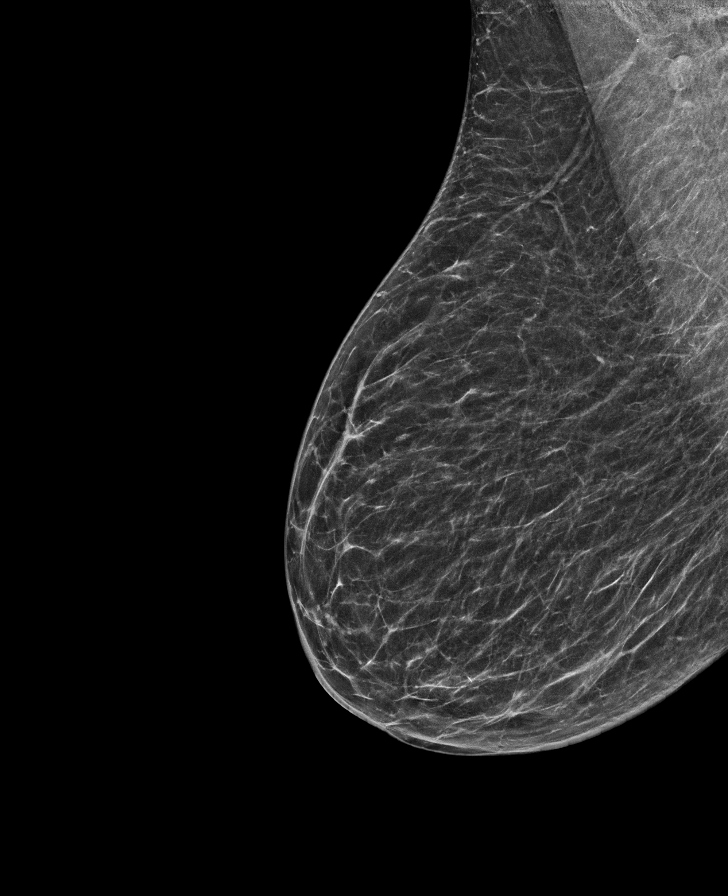

[R CC synth-2D]
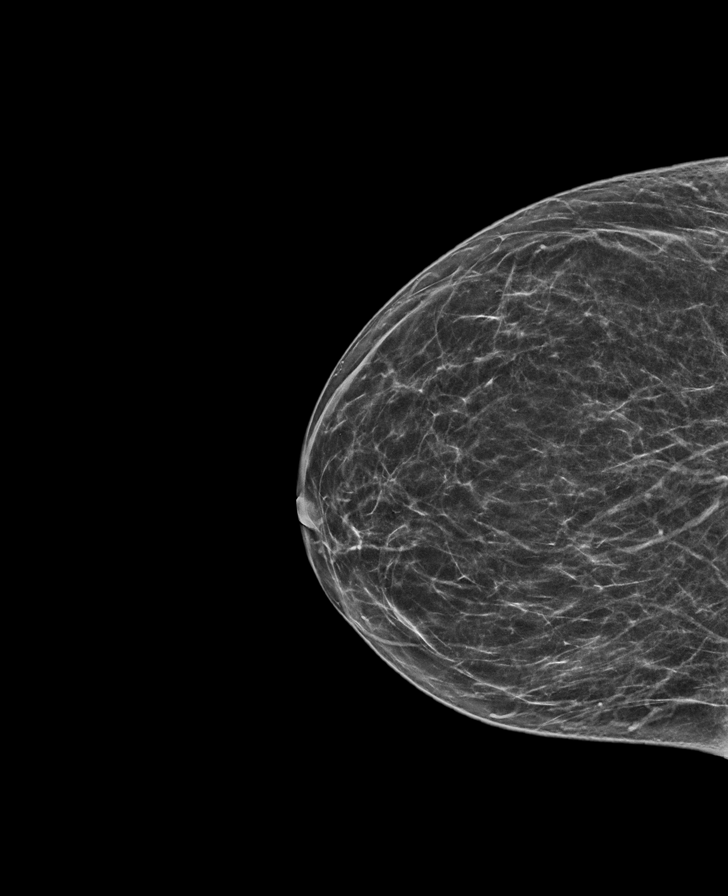

[L MLO synth-2D]
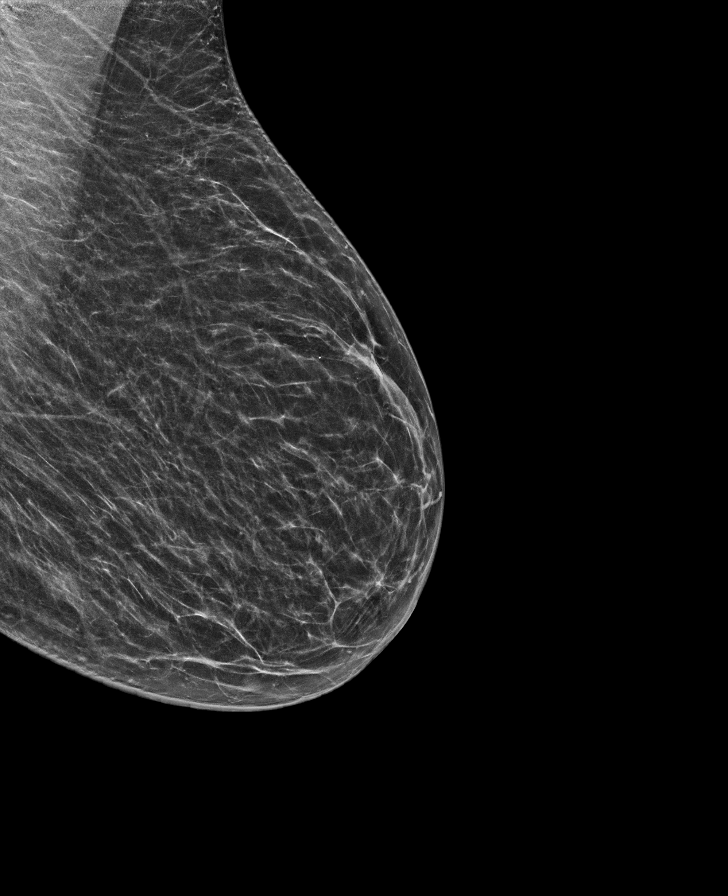

[L MLO tomo · tomo slice 30/59.0]
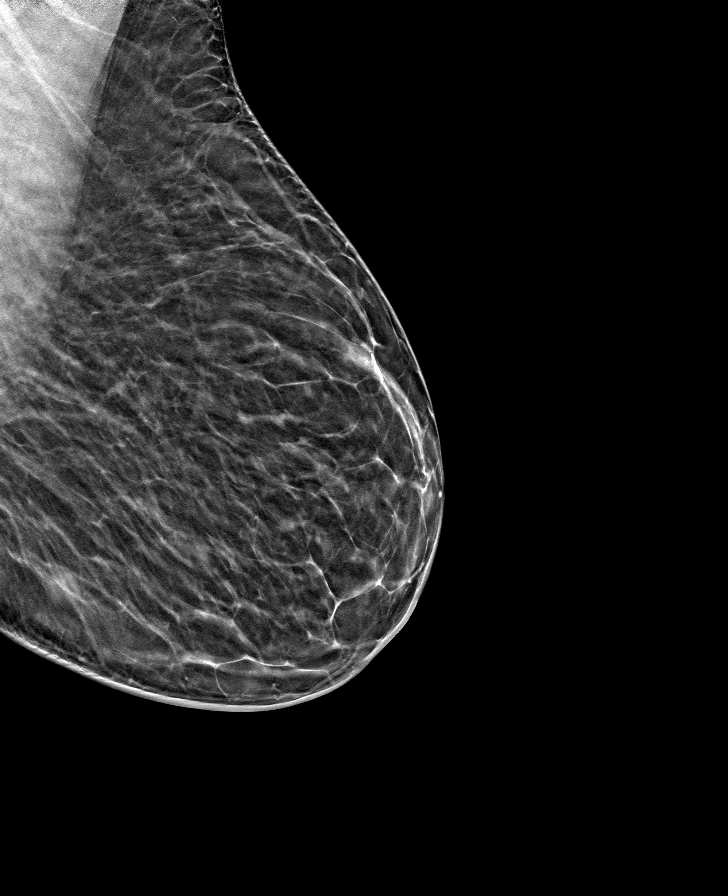

[R MLO tomo · tomo slice 31/60.0]
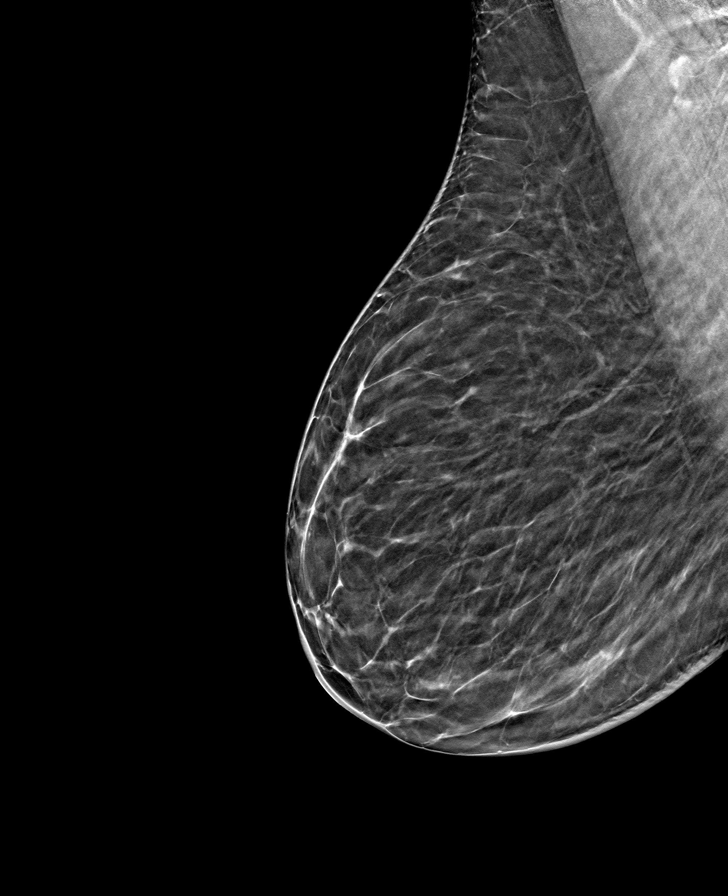

[L CC tomo · tomo slice 28/55.0]
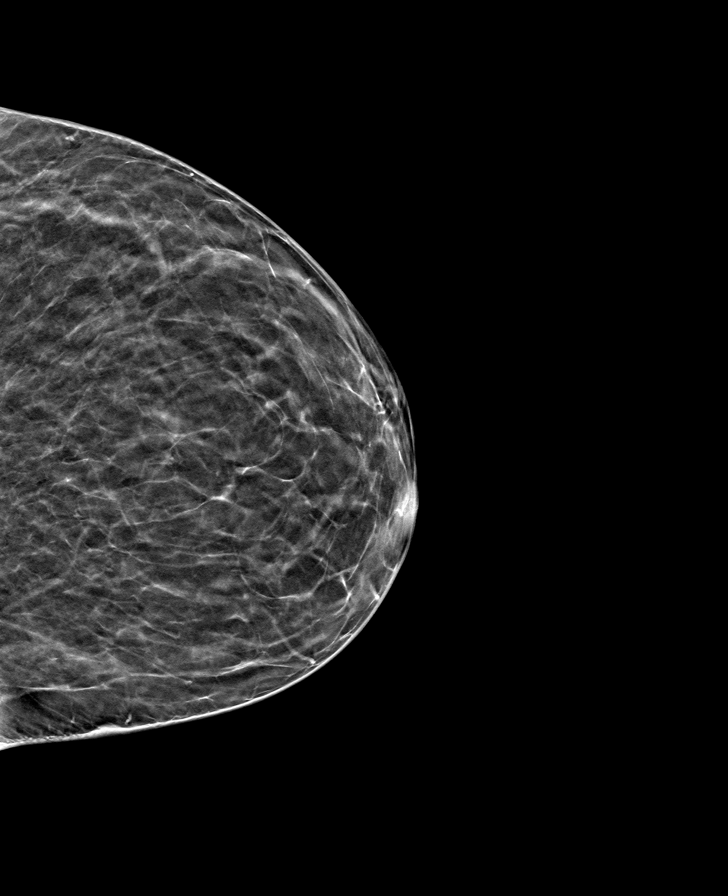

[R CC tomo · tomo slice 29/58.0]
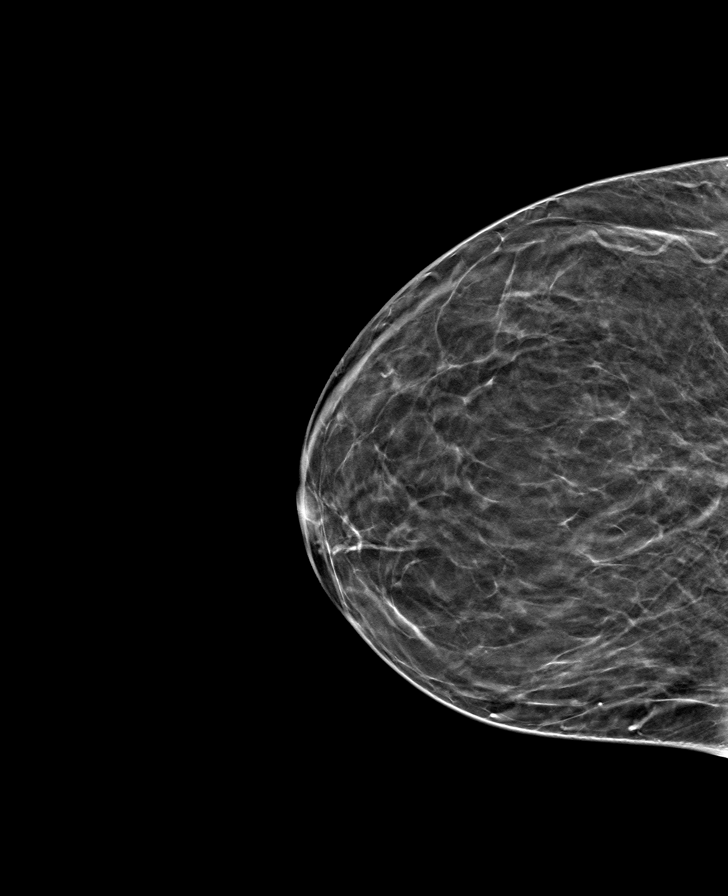

[8 of 24 positions shown; findings below may reference images not displayed]

ACR Breast Density Category b: There are scattered areas of
fibroglandular density.
FINDINGS: There are no findings suspicious for malignancy.
IMPRESSION: No mammographic evidence of malignancy. A result letter of this
screening mammogram will be mailed directly to the patient.

RECOMMENDATION:
Screening mammogram in one year. (Code:51-O-LD2)

BI-RADS CATEGORY  1: Negative.

## 2023-10-20 ENCOUNTER — Inpatient Hospital Stay (HOSPITAL_BASED_OUTPATIENT_CLINIC_OR_DEPARTMENT_OTHER): Payer: Medicare Other | Admitting: Hematology & Oncology

## 2023-10-20 ENCOUNTER — Other Ambulatory Visit: Payer: Self-pay

## 2023-10-20 ENCOUNTER — Encounter: Payer: Self-pay | Admitting: Hematology & Oncology

## 2023-10-20 ENCOUNTER — Inpatient Hospital Stay: Payer: Medicare Other | Attending: Hematology & Oncology

## 2023-10-20 VITALS — BP 146/60 | HR 52 | Temp 99.2°F | Ht 63.0 in | Wt 188.0 lb

## 2023-10-20 DIAGNOSIS — D696 Thrombocytopenia, unspecified: Secondary | ICD-10-CM | POA: Diagnosis not present

## 2023-10-20 DIAGNOSIS — I4891 Unspecified atrial fibrillation: Secondary | ICD-10-CM | POA: Diagnosis not present

## 2023-10-20 DIAGNOSIS — Z7901 Long term (current) use of anticoagulants: Secondary | ICD-10-CM | POA: Diagnosis not present

## 2023-10-20 LAB — CBC WITH DIFFERENTIAL (CANCER CENTER ONLY)
Abs Immature Granulocytes: 0.01 10*3/uL (ref 0.00–0.07)
Basophils Absolute: 0 10*3/uL (ref 0.0–0.1)
Basophils Relative: 1 %
Eosinophils Absolute: 0 10*3/uL (ref 0.0–0.5)
Eosinophils Relative: 1 %
HCT: 38.1 % (ref 36.0–46.0)
Hemoglobin: 12.1 g/dL (ref 12.0–15.0)
Immature Granulocytes: 0 %
Lymphocytes Relative: 35 %
Lymphs Abs: 1.6 10*3/uL (ref 0.7–4.0)
MCH: 28.9 pg (ref 26.0–34.0)
MCHC: 31.8 g/dL (ref 30.0–36.0)
MCV: 90.9 fL (ref 80.0–100.0)
Monocytes Absolute: 0.4 10*3/uL (ref 0.1–1.0)
Monocytes Relative: 9 %
Neutro Abs: 2.5 10*3/uL (ref 1.7–7.7)
Neutrophils Relative %: 54 %
Platelet Count: 100 10*3/uL — ABNORMAL LOW (ref 150–400)
RBC: 4.19 MIL/uL (ref 3.87–5.11)
RDW: 14.6 % (ref 11.5–15.5)
WBC Count: 4.5 10*3/uL (ref 4.0–10.5)
nRBC: 0 % (ref 0.0–0.2)

## 2023-10-20 LAB — CMP (CANCER CENTER ONLY)
ALT: 13 U/L (ref 0–44)
AST: 21 U/L (ref 15–41)
Albumin: 4.5 g/dL (ref 3.5–5.0)
Alkaline Phosphatase: 74 U/L (ref 38–126)
Anion gap: 8 (ref 5–15)
BUN: 18 mg/dL (ref 8–23)
CO2: 30 mmol/L (ref 22–32)
Calcium: 9.8 mg/dL (ref 8.9–10.3)
Chloride: 103 mmol/L (ref 98–111)
Creatinine: 0.8 mg/dL (ref 0.44–1.00)
GFR, Estimated: >60 mL/min (ref >60)
Glucose, Bld: 91 mg/dL (ref 70–99)
Potassium: 4.3 mmol/L (ref 3.5–5.1)
Sodium: 141 mmol/L (ref 135–145)
Total Bilirubin: 0.4 mg/dL (ref <1.2)
Total Protein: 7.4 g/dL (ref 6.5–8.1)

## 2023-10-20 LAB — LACTATE DEHYDROGENASE: LDH: 193 U/L — ABNORMAL HIGH (ref 98–192)

## 2023-10-20 LAB — SAVE SMEAR(SSMR), FOR PROVIDER SLIDE REVIEW

## 2023-10-20 NOTE — Progress Notes (Signed)
Hematology and Oncology Follow Up Visit  Margaret Smith [id number] [birth year] 72 y.o. 10/20/2023   Principle Diagnosis:  Thrombocytopenia-likely mild ITP Atrial fibrillation  Current Therapy:   Observation Eliquis 5 mg p.o. twice daily --DC secondary to Watchman procedure.     Interim History:  Margaret Smith is back for her follow-up.  She did have a watchman put in.  I think this was done back in July or August.  She did well with this.  She is now off anticoagulation.  She has had no problems with her heart.  Apparently, when she was in the hospital for the [facility], she was observed overnight.  She had some bradycardia.  She has had no issues with headache.  She has had no bleeding issues.  She has had no bruising.  She has had no leg swelling.  There is no nausea or vomiting.  She has had no cough.  Thankfully, she has had no issues with COVID.  Currently, I would say that her performance status is probably ECOG 0.    Medications:  Current Outpatient Medications:    acetaminophen (TYLENOL 8 HOUR ARTHRITIS PAIN) 650 MG CR tablet, Take 650 mg by mouth every 8 (eight) hours as needed for pain., Disp: , Rfl:    aspirin EC 81 MG tablet, Take 81 mg by mouth daily. Swallow whole., Disp: , Rfl:    calcium carbonate (OSCAL) 1500 (600 Ca) MG TABS tablet, Take 1,500 mg by mouth daily., Disp: , Rfl:    calcium carbonate (OSCAL) 1500 (600 Ca) MG TABS tablet, Take 600 mg of elemental calcium by mouth daily with breakfast., Disp: , Rfl:    clopidogrel (PLAVIX) 75 MG tablet, Take 75 mg by mouth daily., Disp: , Rfl:    losartan (COZAAR) 25 MG tablet, TAKE ONE TABLET ONCE DAILY, Disp: 90 tablet, Rfl: 1   pantoprazole (PROTONIX) 40 MG tablet, Take 1 tablet (40 mg total) by mouth daily., Disp: 30 tablet, Rfl: 1   pramipexole (MIRAPEX) 0.5 MG tablet, Take 0.5 mg by mouth at bedtime., Disp: , Rfl:    rosuvastatin (CRESTOR) 10 MG tablet, Take 1 tablet (10 mg total) by mouth at bedtime., Disp: 90  tablet, Rfl: 3   traZODone (DESYREL) 50 MG tablet, TAKE 1/2 TO 1 TABLET BY MOUTH AT BEDTIME AS NEEDED FOR SLEEP., Disp: 30 tablet, Rfl: 3  Current Facility-Administered Medications:    dexamethasone (DECADRON) injection 4 mg, 4 mg, Intra-articular, Once, Louann Sjogren, DPM  Allergies: No Known Allergies  Past Medical History, Surgical history, Social history, and Family History were reviewed and updated.  Review of Systems: Review of Systems  Constitutional: Negative.   HENT:  Negative.    Eyes: Negative.   Respiratory: Negative.    Cardiovascular: Negative.   Gastrointestinal: Negative.   Endocrine: Negative.   Genitourinary: Negative.    Musculoskeletal: Negative.   Skin: Negative.   Neurological: Negative.   Hematological: Negative.   Psychiatric/Behavioral: Negative.      Physical Exam:  height is 5\' 3"  (1.6 m) and weight is 188 lb (85.3 kg). Her oral temperature is 99.2 F (37.3 C). Her blood pressure is 153/43 (abnormal) and her pulse is 52 (abnormal). Her oxygen saturation is 100%.   Wt Readings from Last 3 Encounters:  10/20/23 188 lb (85.3 kg)  07/31/23 183 lb (83 kg)  06/23/23 190 lb (86.2 kg)    Physical Exam Vitals reviewed.  HENT:     Head: Normocephalic and atraumatic.  Eyes:     Pupils:  Pupils are equal, round, and reactive to light.  Cardiovascular:     Rate and Rhythm: Normal rate and regular rhythm.     Heart sounds: Normal heart sounds.  Pulmonary:     Effort: Pulmonary effort is normal.     Breath sounds: Normal breath sounds.  Abdominal:     General: Bowel sounds are normal.     Palpations: Abdomen is soft.  Musculoskeletal:        General: No tenderness or deformity. Normal range of motion.     Cervical back: Normal range of motion.  Lymphadenopathy:     Cervical: No cervical adenopathy.  Skin:    General: Skin is warm and dry.     Findings: No erythema or rash.  Neurological:     Mental Status: She is alert and oriented to person,  place, and time.  Psychiatric:        Behavior: Behavior normal.        Thought Content: Thought content normal.        Judgment: Judgment normal.     Lab Results  Component Value Date   WBC 4.5 10/20/2023   HGB 12.1 10/20/2023   HCT 38.1 10/20/2023   MCV 90.9 10/20/2023   PLT 100 (L) 10/20/2023     Chemistry      Component Value Date/Time   NA 141 10/20/2023 1022   K 4.3 10/20/2023 1022   CL 103 10/20/2023 1022   CO2 30 10/20/2023 1022   BUN 18 10/20/2023 1022   CREATININE 0.80 10/20/2023 1022   CREATININE 0.70 10/24/2022 0000   GLU 85 04/12/2015 0000      Component Value Date/Time   CALCIUM 9.8 10/20/2023 1022   ALKPHOS 74 10/20/2023 1022   AST 21 10/20/2023 1022   ALT 13 10/20/2023 1022   BILITOT 0.4 10/20/2023 1022      Impression and Plan: [name] is a very nice 72 year old Afro-American female.  She is quite funny.  She used to work for the [employer].  She is doing quite well.  She is off anticoagulation right now.  She has been followed by Cardiology for the atrial fibrillation.  Her blood count is trending upward which is nice to see.  I looked at her blood under the microscope.  Her platelets look fine.  They were well granulated.  I did not see any immature white blood cells or nucleated red blood cells.  I think we can now get her through the Holiday season and through the Winter.  I will see her back in March.   Josph Macho, MD 11/11/202411:17 AM

## 2023-10-27 ENCOUNTER — Encounter: Payer: Medicare Other | Admitting: Family Medicine

## 2023-10-28 ENCOUNTER — Encounter: Payer: Self-pay | Admitting: Family Medicine

## 2023-10-28 ENCOUNTER — Ambulatory Visit (INDEPENDENT_AMBULATORY_CARE_PROVIDER_SITE_OTHER): Payer: Medicare Other | Admitting: Family Medicine

## 2023-10-28 VITALS — BP 149/69 | HR 55 | Temp 98.7°F | Resp 20 | Ht 63.0 in | Wt 186.5 lb

## 2023-10-28 DIAGNOSIS — Z0000 Encounter for general adult medical examination without abnormal findings: Secondary | ICD-10-CM

## 2023-10-28 DIAGNOSIS — Z13820 Encounter for screening for osteoporosis: Secondary | ICD-10-CM | POA: Insufficient documentation

## 2023-10-28 DIAGNOSIS — M810 Age-related osteoporosis without current pathological fracture: Secondary | ICD-10-CM | POA: Insufficient documentation

## 2023-10-28 NOTE — Progress Notes (Signed)
Subjective:   Margaret Smith is a 72 y.o. female who presents for Medicare Annual (Subsequent) preventive examination.  Visit Complete: In person  Patient Medicare AWV questionnaire was completed by the patient on 10/28/23; I have confirmed that all information answered by patient is correct and no changes since this date.  Cardiac Risk Factors include: advanced age (>29men, >21 women);hypertension;obesity (BMI >30kg/m2)     Objective:    Today's Vitals   10/28/23 1023 10/28/23 1026  BP: (!) 149/69   Pulse: (!) 55   Resp: 20   Temp: 98.7 F (37.1 C)   TempSrc: Oral   SpO2: 100%   Weight: 186 lb 8 oz (84.6 kg)   Height: 5\' 3"  (1.6 m)   PainSc:  6    Body mass index is 33.04 kg/m.     10/28/2023   10:42 AM 10/20/2023   10:53 AM 06/23/2023    1:27 PM 02/21/2023   11:00 AM 02/21/2022   10:24 AM 10/23/2021    9:44 AM 11/15/2020   11:29 AM  Advanced Directives  Does Patient Have a Medical Advance Directive? No No No No No No No  Would patient like information on creating a medical advance directive? No - Patient declined No - Patient declined No - Patient declined No - Patient declined No - Patient declined Yes (MAU/Ambulatory/Procedural Areas - Information given) No - Patient declined    Current Medications (verified) Outpatient Encounter Medications as of 10/28/2023  Medication Sig   acetaminophen (TYLENOL 8 HOUR ARTHRITIS PAIN) 650 MG CR tablet Take 650 mg by mouth every 8 (eight) hours as needed for pain.   aspirin EC 81 MG tablet Take 81 mg by mouth daily. Swallow whole.   calcium carbonate (OSCAL) 1500 (600 Ca) MG TABS tablet Take 1,500 mg by mouth daily.   calcium carbonate (OSCAL) 1500 (600 Ca) MG TABS tablet Take 600 mg of elemental calcium by mouth daily with breakfast.   clopidogrel (PLAVIX) 75 MG tablet Take 75 mg by mouth daily.   losartan (COZAAR) 25 MG tablet TAKE ONE TABLET ONCE DAILY   pantoprazole (PROTONIX) 40 MG tablet Take 1 tablet (40 mg total) by  mouth daily.   pramipexole (MIRAPEX) 0.5 MG tablet Take 0.5 mg by mouth at bedtime.   rosuvastatin (CRESTOR) 10 MG tablet Take 1 tablet (10 mg total) by mouth at bedtime.   traZODone (DESYREL) 50 MG tablet TAKE 1/2 TO 1 TABLET BY MOUTH AT BEDTIME AS NEEDED FOR SLEEP.   Facility-Administered Encounter Medications as of 10/28/2023  Medication   dexamethasone (DECADRON) injection 4 mg    Allergies (verified) Patient has no known allergies.   History: Past Medical History:  Diagnosis Date   Hypertension    Pneumonia    Thrombocytopenia (HCC) 11/15/2020   Past Surgical History:  Procedure Laterality Date   CARPAL TUNNEL RELEASE  1980s   bilateral.    LIPOMA EXCISION     left forearm.    REPLACEMENT TOTAL KNEE Left    Dr. Pill   Family History  Problem Relation Age of Onset   Hypertension Mother    Hyperlipidemia Mother    Diabetes Mother    Heart attack Mother    Stroke Mother    Seizures Mother    Hypertension Father    Diabetes Father    Pancreatic cancer Father    Hypertension Sister    Kidney disease Sister    Hypertension Brother    Breast cancer Paternal Aunt    Diabetes  Maternal Grandfather    Social History   Socioeconomic History   Marital status: Single    Spouse name: Not on file   Number of children: 0   Years of education: Not on file   Highest education level: Not on file  Occupational History   Occupation: Attention officer    Comment: [employer]  Tobacco Use   Smoking status: Former    Current packs/day: 0.00    Average packs/day: 0.3 packs/day for 15.0 years (3.8 ttl pk-yrs)    Types: Cigarettes    Start date: 04/08/1989    Quit date: 04/08/2004    Years since quitting: 19.5   Smokeless tobacco: Never  Vaping Use   Vaping status: Never Used  Substance and Sexual Activity   Alcohol use: No   Drug use: No   Sexual activity: Never  Other Topics Concern   Not on file  Social History Narrative   Walks for exercise 5-7  days per week for  45 min. she lives and takes care of her mother.   Social Determinants of Health   Financial Resource Strain: Low Risk  (10/28/2023)   Overall Financial Resource Strain (CARDIA)    Difficulty of Paying Living Expenses: Not hard at all  Food Insecurity: No Food Insecurity (10/28/2023)   Hunger Vital Sign    Worried About Running Out of Food in the Last Year: Never true    Ran Out of Food in the Last Year: Never true  Transportation Needs: No Transportation Needs (10/28/2023)   PRAPARE - Administrator, Civil Service (Medical): No    Lack of Transportation (Non-Medical): No  Physical Activity: Sufficiently Active (10/28/2023)   Exercise Vital Sign    Days of Exercise per Week: 4 days    Minutes of Exercise per Session: 60 min  Stress: No Stress Concern Present (10/28/2023)   [employer] of Occupational Health - Occupational Stress Questionnaire    Feeling of Stress : Only a little  Social Connections: Moderately Isolated (10/28/2023)   Social Connection and Isolation Panel [NHANES]    Frequency of Communication with Friends and Family: More than three times a week    Frequency of Social Gatherings with Friends and Family: Never    Attends Religious Services: More than 4 times per year    Active Member of [employer] or Organizations: No    Attends Engineer, structural: Never    Marital Status: Never married    Tobacco Counseling Counseling given: Not Answered   Clinical Intake:  Pre-visit preparation completed: No  Pain : 0-10 Pain Score: 6  Pain Type: Acute pain Pain Location: Knee Pain Orientation: Right Pain Radiating Towards: does not radiate Pain Descriptors / Indicators: Constant Pain Onset: 1 to 4 weeks ago Pain Frequency: Several days a week Pain Relieving Factors: ice helped at times Effect of Pain on Daily Activities: has not stopped her from ADLs  Pain Relieving Factors: ice helped at times  BMI - recorded: 33 Nutritional Status: BMI  > 30  Obese Nutritional Risks: None Diabetes: No  How often do you need to have someone help you when you read instructions, pamphlets, or other written materials from your doctor or pharmacy?: 1 - Never What is the last grade level you completed in school?: 12  Interpreter Needed?: No      Activities of Daily Living    10/28/2023   10:30 AM 10/26/2023    5:10 PM  In your present state of health, do  you have any difficulty performing the following activities:  Hearing? 0 0  Vision? 0 0  Difficulty concentrating or making decisions? 0 0  Walking or climbing stairs? 1 1  Comment due to knee replacement surgery   Dressing or bathing? 0 0  Doing errands, shopping? 0 0  Preparing Food and eating ? N N  Using the Toilet? N N  In the past six months, have you accidently leaked urine? Y Y  Do you have problems with loss of bowel control? N N  Managing your Medications? N N  Managing your Finances? N N  Housekeeping or managing your Housekeeping? N N    Patient Care Team: Agapito Games, MD as PCP - General (Family Medicine) Raynald Blend, OD as Referring Physician (Optometry) Louie Bun, MD as Referring Physician (Cardiology)  Indicate any recent Medical Services you may have received from other than [facility] providers in the past year (date may be approximate).     Assessment:   This is a routine wellness examination for Zarea.  Hearing/Vision screen Hearing Screening - Comments:: Did not test, grossly intact.  Vision Screening - Comments:: Wears glasses, no issues.    Goals Addressed             This Visit's Progress    Exercise 150 min/wk Moderate Activity       Walks daily.       Depression Screen    10/28/2023   10:47 AM 10/28/2023   10:41 AM 10/24/2022    9:48 AM 10/23/2021    9:44 AM 10/19/2020   10:09 AM 10/12/2019    8:52 AM 08/05/2018    9:18 AM  PHQ 2/9 Scores  PHQ - 2 Score 0 0 0 0 0 3 0  PHQ- 9 Score      7     Fall  Risk    10/28/2023   10:44 AM 10/26/2023    5:10 PM 10/24/2022    9:48 AM 10/23/2021    9:44 AM 10/20/2020    8:55 AM  Fall Risk   Falls in the past year? 0  0 1 0  Number falls in past yr: 0 0 0 0   Injury with Fall? 0 0 0 0   Risk for fall due to : No Fall Risks  No Fall Risks No Fall Risks No Fall Risks  Follow up   Falls evaluation completed Falls prevention discussed;Falls evaluation completed Falls evaluation completed    MEDICARE RISK AT HOME: Medicare Risk at Home Any stairs in or around the home?: Yes If so, are there any without handrails?: Yes Home free of loose throw rugs in walkways, pet beds, electrical cords, etc?: Yes Adequate lighting in your home to reduce risk of falls?: Yes Life alert?: No Use of a cane, walker or w/c?: No Grab bars in the bathroom?: Yes Shower chair or bench in shower?: Yes Elevated toilet seat or a handicapped toilet?: Yes  TIMED UP AND GO:  Was the test performed?  Yes  Length of time to ambulate 10 feet: 7 sec Gait steady and fast without use of assistive device    Cognitive Function:        10/28/2023   10:47 AM 10/24/2022   10:22 AM 10/12/2019    8:33 AM 08/05/2018    9:17 AM  6CIT Screen  What Year? 0 points 0 points 0 points 0 points  What month? 0 points 0 points 0 points 0 points  What time?  0 points 0 points 0 points 0 points  Count back from 20 0 points 0 points 0 points 0 points  Months in reverse 0 points 0 points 0 points 0 points  Repeat phrase 0 points 0 points 2 points 0 points  Total Score 0 points 0 points 2 points 0 points    Immunizations Immunization History  Administered Date(s) Administered   Fluad Quad(high Dose 65+) 10/12/2019, 10/19/2020, 09/29/2021   Fluad Trivalent(High Dose 65+) 07/31/2023   Influenza Inj Mdck Quad Pf 09/08/2015   Influenza, High Dose Seasonal PF 09/05/2022   Influenza,inj,Quad PF,6+ Mos 11/18/2014, 12/28/2016   Influenza,inj,quad, With Preservative 09/08/2015    Influenza-Unspecified 10/13/2013, 09/09/2015   Moderna Covid-19 Vaccine Bivalent Booster 50yrs & up 10/15/2021   Moderna Sars-Covid-2 Vaccination 02/17/2020, 03/09/2020, 10/14/2020, 07/09/2021   Pfizer Covid-19 Vaccine Bivalent Booster 66yrs & up 07/31/2023   Pfizer(Comirnaty)Fall Seasonal Vaccine 12 years and older 09/17/2022   Pneumococcal Conjugate-13 11/26/2013   Pneumococcal Polysaccharide-23 08/05/2018   Tdap 11/26/2013   Zoster Recombinant(Shingrix) 12/14/2020, 02/28/2021   Zoster, Live 11/18/2014    TDAP status: Up to date  Flu Vaccine status: Up to date  Pneumococcal vaccine status: Up to date  Covid-19 vaccine status: Completed vaccines  Qualifies for Shingles Vaccine? Yes   Zostavax completed Yes   Shingrix Completed?: Yes  Screening Tests Health Maintenance  Topic Date Due   COVID-19 Vaccine (8 - 2023-24 season) 09/25/2023   DTaP/Tdap/Td (2 - Td or Tdap) 11/27/2023   Medicare Annual Wellness (AWV)  10/27/2024   MAMMOGRAM  12/11/2024   Colonoscopy  01/29/2033   Pneumonia Vaccine 40+ Years old  Completed   INFLUENZA VACCINE  Completed   DEXA SCAN  Completed   Hepatitis C Screening  Completed   Zoster Vaccines- Shingrix  Completed   HPV VACCINES  Aged Out    Health Maintenance  Health Maintenance Due  Topic Date Due   COVID-19 Vaccine (8 - 2023-24 season) 09/25/2023    Colorectal cancer screening: Type of screening: Colonoscopy. Completed 01/29/23. Repeat every 7 years  Mammogram status: Completed 12/11/22. Repeat every year  Bone Density status: Completed 08/05/2018. Results reflect: Bone density results: OSTEOPOROSIS. Repeat every 2 years.  Lung Cancer Screening: (Low Dose CT Chest recommended if Age 23-80 years, 20 pack-year currently smoking OR have quit w/in 15years.) does not qualify.   Lung Cancer Screening Referral: n/a   Additional Screening:  Hepatitis C Screening: does qualify; Completed 12/28/2015  Vision Screening: Recommended annual  ophthalmology exams for early detection of glaucoma and other disorders of the eye. Is the patient up to date with their annual eye exam?  Yes  Who is the provider or what is the name of the office in which the patient attends annual eye exams? [facility] in [place], Dr. Neale Burly  If pt is not established with a provider, would they like to be referred to a provider to establish care? No .   Dental Screening: Recommended annual dental exams for proper oral hygiene  Diabetic Foot Exam: n/a   Community Resource Referral / Chronic Care Management: CRR required this visit?  No   CCM required this visit?  No     Plan:     I have personally reviewed and noted the following in the patient's chart:   Medical and social history Use of alcohol, tobacco or illicit drugs  Current medications and supplements including opioid prescriptions. Patient is not currently taking opioid prescriptions. Functional ability and status Nutritional status Physical activity Advanced  directives List of other physicians: Dr. Myna Hidalgo, oncology, S. Eddie Candle, cardiology, Dr. Mayme Genta, watchman device, Dr. Carolyne Fiscal, ENT.  Hospitalizations, surgeries, and ER visits in previous 12 months: Had watchman device placed on 07/24/23.  Vitals Screenings to include cognitive, depression, and falls Referrals and appointments: bone density   In addition, I have reviewed and discussed with patient certain preventive protocols, quality metrics, and best practice recommendations. A written personalized care plan for preventive services as well as general preventive health recommendations were provided to patient.     Novella Olive, FNP   10/28/2023   After Visit Summary: (In Person-Printed) AVS printed and given to the patient  Follow-up with PCP as scheduled. Labs done per oncology on 10/20/23.  07/22/23: A1C 5.4

## 2023-10-29 ENCOUNTER — Other Ambulatory Visit: Payer: Self-pay | Admitting: Family Medicine

## 2023-10-29 DIAGNOSIS — Z1231 Encounter for screening mammogram for malignant neoplasm of breast: Secondary | ICD-10-CM

## 2023-11-12 ENCOUNTER — Ambulatory Visit (INDEPENDENT_AMBULATORY_CARE_PROVIDER_SITE_OTHER): Payer: Medicare Other | Admitting: Sports Medicine

## 2023-11-12 ENCOUNTER — Encounter: Payer: Self-pay | Admitting: Sports Medicine

## 2023-11-12 DIAGNOSIS — M1711 Unilateral primary osteoarthritis, right knee: Secondary | ICD-10-CM | POA: Diagnosis not present

## 2023-11-12 NOTE — Assessment & Plan Note (Signed)
Very pleasant 72 year old female, known knee osteoarthritis, last injected 2021, now having recurrence of pain over 2 months. Medial joint line, she is agreeable to do more of a conservative approach, arthritis strength Tylenol 3 times daily, updated x-rays, formal PT per Return to see me in 6 weeks, injection if not better.

## 2023-11-12 NOTE — Progress Notes (Signed)
    Procedures performed today:    None.  Independent interpretation of notes and tests performed by another provider:   None.  Brief History, Exam, Impression, and Recommendations:    Primary osteoarthritis of right knee, post left knee arthroplasty Very pleasant 72 year old female, known knee osteoarthritis, last injected 2021, now having recurrence of pain over 2 months. Medial joint line, she is agreeable to do more of a conservative approach, arthritis strength Tylenol 3 times daily, updated x-rays, formal PT per Return to see me in 6 weeks, injection if not better.    ____________________________________________ Ihor Austin. Benjamin Stain, M.D., ABFM., CAQSM., AME. Primary Care and Sports Medicine [facility]  Adjunct Professor of Family Medicine  [employer] [facility] [employer] [employer] [facility] [employer]  Restaurant manager, fast food

## 2023-11-12 NOTE — Patient Instructions (Signed)
[facility] [address] [place], [place] [address] [phone]

## 2023-11-17 DIAGNOSIS — I480 Paroxysmal atrial fibrillation: Secondary | ICD-10-CM | POA: Diagnosis not present

## 2023-11-19 ENCOUNTER — Telehealth: Payer: Self-pay

## 2023-11-19 DIAGNOSIS — M1711 Unilateral primary osteoarthritis, right knee: Secondary | ICD-10-CM

## 2023-11-19 NOTE — Telephone Encounter (Signed)
Copied from CRM [id number]. Topic: General - Other >> Nov 18, 2023  4:18 PM Prudencio Pair wrote: Reason for CRM: Patient called stating that she received a message on her phone from a gentlemen at the Therapy location that Dr. Karie Schwalbe referred her to. She stated that she told the gentleman that she did not want to come to that location because of the constant outbreaks of covid & other sicknesses. She advised that she told him she would rather do therapy twice a week in the [place] location. Patient stated that the guy told her that he would take care of getting it changed but patient stated that she would call and let Dr. Karie Schwalbe know as well. Please contact patient to give her an update on Therapy location. Pt's CB #: [phone].

## 2023-11-19 NOTE — Telephone Encounter (Signed)
This is the first time hearing about it, I am happy to go ahead and place the order for physical therapy here.

## 2023-11-20 ENCOUNTER — Ambulatory Visit (INDEPENDENT_AMBULATORY_CARE_PROVIDER_SITE_OTHER): Payer: Medicare Other | Admitting: Podiatry

## 2023-11-20 ENCOUNTER — Encounter: Payer: Self-pay | Admitting: Podiatry

## 2023-11-20 DIAGNOSIS — L609 Nail disorder, unspecified: Secondary | ICD-10-CM | POA: Diagnosis not present

## 2023-11-20 DIAGNOSIS — B351 Tinea unguium: Secondary | ICD-10-CM | POA: Diagnosis not present

## 2023-11-20 NOTE — Progress Notes (Signed)
  Subjective:  Patient ID: Margaret Smith, female    DOB: 06-24-1951,   MRN: [id number]  No chief complaint on file.   72 y.o. female presents for new concern of left third digit that has been dark and discolored. Has tried some treatments before but have not had any improvement.  . Denies any other pedal complaints. Denies n/v/f/c.   Past Medical History:  Diagnosis Date   Hypertension    Pneumonia    Thrombocytopenia (HCC) 11/15/2020    Objective:  Physical Exam: Vascular: DP/PT pulses 2/4 bilateral. CFT <3 seconds. Normal hair growth on digits. No edema.  Skin. No lacerations or abrasions bilateral feet. Left third digit discolored with dark streaks noted and possible some hemorrhage. No pain  Musculoskeletal: MMT 5/5 bilateral lower extremities in DF, PF, Inversion and Eversion. Deceased ROM in DF of ankle joint.  Neurological: Sensation intact to light touch.   Assessment:   1. Onychomycosis      Plan:  Patient was evaluated and treated and all questions answered. -Examined patient -Discussed treatment options for painful dystrophic nails  -Clinical picture and Fungal culture was obtained by removing a portion of the hard nail itself from each of the involved toenails using a sterile nail nipper and sent to [facility] lab. Patient tolerated the biopsy procedure well without discomfort or need for anesthesia.  -Discussed fungal nail treatment options including oral, topical, and laser treatments.  -Patient to return in 4 weeks for follow up evaluation and discussion of fungal culture results or sooner if symptoms worsen.   Louann Sjogren, DPM

## 2023-11-24 NOTE — Addendum Note (Signed)
Addended by: Daryel November on: 11/24/2023 04:25 PM   Modules accepted: Orders

## 2023-12-05 ENCOUNTER — Other Ambulatory Visit: Payer: Self-pay | Admitting: Podiatry

## 2023-12-07 NOTE — Therapy (Signed)
OUTPATIENT PHYSICAL THERAPY LOWER EXTREMITY EVALUATION   Patient Name: Margaret Smith MRN: [id number] DOB:1951/11/16, 72 y.o., female Today's Date: 12/08/2023  END OF SESSION:  PT End of Session - 12/08/23 1317     Visit Number 1    Date for PT Re-Evaluation 02/02/24    Authorization Type MCR    PT Start Time 1318    PT Stop Time 1357    PT Time Calculation (min) 39 min    Activity Tolerance Patient tolerated treatment well    Behavior During Therapy [facility] for tasks assessed/performed             Past Medical History:  Diagnosis Date   Hypertension    Pneumonia    Thrombocytopenia (HCC) 11/15/2020   Past Surgical History:  Procedure Laterality Date   CARPAL TUNNEL RELEASE  1980s   bilateral.    LIPOMA EXCISION     left forearm.    REPLACEMENT TOTAL KNEE Left    Dr. Corinna Capra   Patient Active Problem List   Diagnosis Date Noted   Encounter for screening for osteoporosis 10/28/2023   Osteoporosis 10/28/2023   Mitral regurgitation 01/24/2023   Tricuspid regurgitation 01/24/2023   OSA on CPAP 11/15/2022   Paroxysmal A-fib (HCC) 11/12/2022   Primary hypertension 09/17/2022   Thrombocytopenia (HCC) 11/15/2020   White coat syndrome without diagnosis of hypertension 01/21/2017   Bradycardia 12/28/2015   Thyromegaly 12/28/2015   Primary osteoarthritis of right knee, post left knee arthroplasty 06/26/2015    PCP: Agapito Games, MD   REFERRING PROVIDER: Monica Becton,*   REFERRING DIAG: K09.38 (ICD-10-CM) - Primary osteoarthritis of right knee   THERAPY DIAG:  Chronic pain of right knee  Stiffness of right knee, not elsewhere classified  Muscle weakness (generalized)  Rationale for Evaluation and Treatment: Rehabilitation  ONSET DATE: October 2024 SUBJECTIVE:   SUBJECTIVE STATEMENT: Had injection in 2021, but cannot have another due to low platelets. Sometimes it buckles.   PERTINENT HISTORY: L TKR, HTN, low platelets, R plantar  fasciitis, previous A fib (had Watchman implanted) PAIN:  Are you having pain? Yes: NPRS scale: no pain now, 4-5/10 Pain location: R knee Pain description: sharp, hits and goes Aggravating factors: walking Relieving factors: rest  PRECAUTIONS: None  RED FLAGS: None   WEIGHT BEARING RESTRICTIONS: No  FALLS:  Has patient fallen in last 6 months? No  LIVING ENVIRONMENT: Lives with: lives alone Lives in: House/apartment Stairs: Yes: Internal: 13 steps; on left going up and External: 1 steps; none Has following equipment at home: Single point cane, Walker - 2 wheeled, and Wheelchair (manual)  OCCUPATION: retired  PLOF: Independent  PATIENT GOALS: to be able to walk her hour per day  NEXT MD VISIT: none scheduled  OBJECTIVE:  Note: Objective measures were completed at Evaluation unless otherwise noted.  DIAGNOSTIC FINDINGS: Scheduled  PATIENT SURVEYS:  LEFS 54 / 80 = 67.5 %  COGNITION: Overall cognitive status: Within functional limits for tasks assessed     SENSATION: WFL  EDEMA:  Patient reports it swells intermittently  MUSCLE LENGTH: Marked quad tightness and gastroc B  POSTURE: rounded shoulders, forward head, and flexed trunk  genu valgus B  PALPATION: Lateral right knee  LOWER EXTREMITY ROM:  A/P ROM Right eval Left eval  Knee flexion 99/108 74/80  Knee extension 0 0   (Blank rows = not tested)  LOWER EXTREMITY MMT:  MMT Right eval Left eval  Hip flexion 4- 4-  Hip extension 5 5  Hip  abduction 4- 4-  Hip adduction 5 5  Hip internal rotation    Hip external rotation    Knee flexion 4- 5  Knee extension 4+ 5  Ankle dorsiflexion 5 5   (Blank rows = not tested)   FUNCTIONAL TESTS:  5 times sit to stand: 14.17 sec Stairs - eccentric weakness R, hyperextends right with ascending  GAIT: Distance walked: 20 Assistive device utilized: None Level of assistance: Complete Independence Comments: genu valgus B                                                                                                                                 TREATMENT DATE:   12/08/23 See pt ed and HEP   PATIENT EDUCATION:  Education details: PT eval findings, anticipated POC, and initial HEP  Person educated: Patient Education method: Explanation, Demonstration, and Handouts Education comprehension: verbalized understanding and returned demonstration  HOME EXERCISE PROGRAM: Access Code: [email or web address] URL: [email or web address] Date: 12/08/2023 Prepared by: Raynelle Fanning  Exercises - Supine Quadriceps Stretch with Strap on Table (Mirrored)  - 2 x daily - 7 x weekly - 1 sets - 3 reps - 30-60 sec hold - Supine Active Straight Leg Raise  - 2 x daily - 7 x weekly - 3 sets - 10 reps - Standing Hip Abduction with Counter Support  - 1 x daily - 7 x weekly - 2 sets - 10 reps - Sit to Stand  - 3 x daily - 7 x weekly - 1-2 sets - 10 reps  ASSESSMENT:  CLINICAL IMPRESSION: Patient is a 72 y.o. femlale who was seen today for physical therapy evaluation and treatment for R knee pain. She has had injections in the past, which have helped, but she is no longer eligible due to low platelets. She has pain primarily with walking. She demonstrates B LE weakness, ROM and flexibility deficits and functional weakness with sit to stands and stairs. She will benefit from skilled PT to address these deficits.     OBJECTIVE IMPAIRMENTS: decreased activity tolerance, difficulty walking, decreased ROM, decreased strength, increased edema, impaired flexibility, postural dysfunction, and pain.   ACTIVITY LIMITATIONS: lifting, squatting, stairs, transfers, bed mobility, and locomotion level  PARTICIPATION LIMITATIONS: community activity  PERSONAL FACTORS: Age, Fitness, Time since onset of injury/illness/exacerbation, and 1 comorbidity: OA  are also affecting patient's functional outcome.   REHAB POTENTIAL: Good  CLINICAL DECISION MAKING: Evolving/moderate  complexity  EVALUATION COMPLEXITY: Low   SHORT TERM GOALS: Target date: 12/29/2023    Ind with initial HEP Baseline: Goal status: INITIAL  2.  Decreased knee pain by 25%. Baseline:  Goal status: INITIAL   LONG TERM GOALS: Target date: 02/02/2024   Ind with advanced HEP and its progression Baseline:  Goal status: INITIAL  2. Improved R knee ROM to 0-105 deg to normalize gait and functional mobility Baseline:  Goal status: INITIAL  3.  Improved LE strength to 5/5 to improve function Baseline:  Goal status: INITIAL  4.  Able to ascend stairs with a reciprocal gait pattern and one UE  Baseline:  Goal status: INITIAL  5.  Decreased knee pain in the by >=75% with ADLs. Baseline:  Goal status: INITIAL  6.  Improved LEFS  to 63 showing functional improvement Baseline: 54 / 80 = 67.5 % Goal status:INITIAL    PLAN:  PT FREQUENCY: 2x/week  PT DURATION: 8 weeks  PLANNED INTERVENTIONS: 97164- PT Re-evaluation, 97110-Therapeutic exercises, 97530- Therapeutic activity, 97112- Neuromuscular re-education, 97535- Self Care, 32440- Manual therapy, [id number]- Gait training, [phone]- Aquatic Therapy, 97014- Electrical stimulation (unattended), 97016- Vasopneumatic device, [id number]- Ionotophoresis 4mg /ml Dexamethasone, Patient/Family education, Balance training, Stair training, Taping, Dry Needling, Joint mobilization, Cryotherapy, and Moist heat  PLAN FOR NEXT SESSION: Review and progress HEP, R knee ROM, LE strengthening, stairs     Solon Palm, PT  12/08/2023, 4:07 PM

## 2023-12-08 ENCOUNTER — Other Ambulatory Visit: Payer: Self-pay

## 2023-12-08 ENCOUNTER — Ambulatory Visit: Payer: Medicare Other | Attending: Sports Medicine | Admitting: Physical Therapy

## 2023-12-08 ENCOUNTER — Encounter: Payer: Self-pay | Admitting: Physical Therapy

## 2023-12-08 DIAGNOSIS — M1711 Unilateral primary osteoarthritis, right knee: Secondary | ICD-10-CM | POA: Insufficient documentation

## 2023-12-08 DIAGNOSIS — M25661 Stiffness of right knee, not elsewhere classified: Secondary | ICD-10-CM | POA: Insufficient documentation

## 2023-12-08 DIAGNOSIS — G8929 Other chronic pain: Secondary | ICD-10-CM | POA: Insufficient documentation

## 2023-12-08 DIAGNOSIS — M6281 Muscle weakness (generalized): Secondary | ICD-10-CM | POA: Diagnosis present

## 2023-12-08 DIAGNOSIS — M25561 Pain in right knee: Secondary | ICD-10-CM | POA: Insufficient documentation

## 2023-12-11 ENCOUNTER — Ambulatory Visit: Payer: Medicare Other | Attending: Sports Medicine

## 2023-12-11 DIAGNOSIS — G8929 Other chronic pain: Secondary | ICD-10-CM | POA: Insufficient documentation

## 2023-12-11 DIAGNOSIS — M6281 Muscle weakness (generalized): Secondary | ICD-10-CM | POA: Diagnosis present

## 2023-12-11 DIAGNOSIS — M25661 Stiffness of right knee, not elsewhere classified: Secondary | ICD-10-CM | POA: Diagnosis present

## 2023-12-11 DIAGNOSIS — M25561 Pain in right knee: Secondary | ICD-10-CM | POA: Insufficient documentation

## 2023-12-11 NOTE — Therapy (Signed)
 OUTPATIENT PHYSICAL THERAPY LOWER EXTREMITY TREATMENT   Patient Name: Margaret Smith MRN: [id number] DOB:1951-04-24, 73 y.o., female Today's Date: 12/11/2023  END OF SESSION:  PT End of Session - 12/11/23 1405     Visit Number 2    Date for PT Re-Evaluation 02/02/24    Authorization Type MCR    Progress Note Due on Visit 10    PT Start Time 1405    PT Stop Time 1450    PT Time Calculation (min) 45 min    Activity Tolerance Patient tolerated treatment well    Behavior During Therapy [facility] for tasks assessed/performed             Past Medical History:  Diagnosis Date   Hypertension    Pneumonia    Thrombocytopenia (HCC) 11/15/2020   Past Surgical History:  Procedure Laterality Date   CARPAL TUNNEL RELEASE  1980s   bilateral.    LIPOMA EXCISION     left forearm.    REPLACEMENT TOTAL KNEE Left    Dr. Linford   Patient Active Problem List   Diagnosis Date Noted   Encounter for screening for osteoporosis 10/28/2023   Osteoporosis 10/28/2023   Mitral regurgitation 01/24/2023   Tricuspid regurgitation 01/24/2023   OSA on CPAP 11/15/2022   Paroxysmal A-fib (HCC) 11/12/2022   Primary hypertension 09/17/2022   Thrombocytopenia (HCC) 11/15/2020   White coat syndrome without diagnosis of hypertension 01/21/2017   Bradycardia 12/28/2015   Thyromegaly 12/28/2015   Primary osteoarthritis of right knee, post left knee arthroplasty 06/26/2015    PCP: Alvan Dorothyann BIRCH, MD   REFERRING PROVIDER: Curtis Debby PARAS,*   REFERRING DIAG: F82.88 (ICD-10-CM) - Primary osteoarthritis of right knee   THERAPY DIAG:  Chronic pain of right knee  Stiffness of right knee, not elsewhere classified  Muscle weakness (generalized)  Rationale for Evaluation and Treatment: Rehabilitation  ONSET DATE: October 2024 SUBJECTIVE:   SUBJECTIVE STATEMENT: Patient reports she did a 4 mile walk prior to PT today and is feeling sore in R knee.  PERTINENT HISTORY: L TKR, HTN, low  platelets, R plantar fasciitis, previous A fib (had Watchman implanted) PAIN:  Are you having pain? Yes: NPRS scale: no pain now, 4-5/10 Pain location: R knee Pain description: sharp, hits and goes Aggravating factors: walking Relieving factors: rest  PRECAUTIONS: None  RED FLAGS: None   WEIGHT BEARING RESTRICTIONS: No  FALLS:  Has patient fallen in last 6 months? No  LIVING ENVIRONMENT: Lives with: lives alone Lives in: House/apartment Stairs: Yes: Internal: 13 steps; on left going up and External: 1 steps; none Has following equipment at home: Single point cane, Walker - 2 wheeled, and Wheelchair (manual)  OCCUPATION: retired  PLOF: Independent  PATIENT GOALS: to be able to walk her hour per day  NEXT MD VISIT: none scheduled  OBJECTIVE:  Note: Objective measures were completed at Evaluation unless otherwise noted.  DIAGNOSTIC FINDINGS: Scheduled  PATIENT SURVEYS:  LEFS 54 / 80 = 67.5 %  COGNITION: Overall cognitive status: Within functional limits for tasks assessed     SENSATION: WFL  EDEMA:  Patient reports it swells intermittently  MUSCLE LENGTH: Marked quad tightness and gastroc B  POSTURE: rounded shoulders, forward head, and flexed trunk  genu valgus B  PALPATION: Lateral right knee  LOWER EXTREMITY ROM:  A/P ROM Right eval Left eval  Knee flexion 99/108 74/80  Knee extension 0 0   (Blank rows = not tested)  LOWER EXTREMITY MMT:  MMT Right eval Left eval  Hip flexion 4- 4-  Hip extension 5 5  Hip abduction 4- 4-  Hip adduction 5 5  Hip internal rotation    Hip external rotation    Knee flexion 4- 5  Knee extension 4+ 5  Ankle dorsiflexion 5 5   (Blank rows = not tested)   FUNCTIONAL TESTS:  5 times sit to stand: 14.17 sec Stairs - eccentric weakness R, hyperextends right with ascending  GAIT: Distance walked: 20 Assistive device utilized: None Level of assistance: Complete Independence Comments: genu valgus B                                                                                                                                 [facility] Adult PT Treatment:                                                DATE: 12/11/2023 Therapeutic Exercise: NuStep L5 x 5 min SAQ (green bolster) + 5#AW 2x15 SLR in parallel x10, ER 2x10 Bridges 2x15 S/L clamshells + RTB 2x10 S/L straight leg hip abd + RTB 2x10 Prone quad stretch with strap   TREATMENT DATE:   12/08/23 See pt ed and HEP   PATIENT EDUCATION:  Education details: PT eval findings, anticipated POC, and initial HEP  Person educated: Patient Education method: Explanation, Demonstration, and Handouts Education comprehension: verbalized understanding and returned demonstration  HOME EXERCISE PROGRAM: Access Code: [email or web address] URL: [email or web address] Date: 12/11/2023 Prepared by: Lamarr Price  Exercises - Supine Quadriceps Stretch with Strap on Table (Mirrored)  - 2 x daily - 7 x weekly - 1 sets - 3 reps - 30-60 sec hold - Supine Active Straight Leg Raise  - 2 x daily - 7 x weekly - 3 sets - 10 reps - Standing Hip Abduction with Counter Support  - 1 x daily - 7 x weekly - 2 sets - 10 reps - Sit to Stand  - 3 x daily - 7 x weekly - 1-2 sets - 10 reps - Clam with Resistance  - 1 x daily - 7 x weekly - 3 sets - 10 reps - Sidelying Hip Abduction with Resistance at Thighs  - 1 x daily - 7 x weekly - 3 sets - 10 reps - Prone Quadriceps Stretch with Strap  - 1 x daily - 7 x weekly - 1 sets - 3-5 reps - 10-30 sec hold  ASSESSMENT:  CLINICAL IMPRESSION: Glute and quad strengthening progressed with hip abduction variations and straight leg exercises. Noted knee valgus during side lying hip abduction; cueing postural awareness improved knee alignment. Prone quad stretch added to HEP for stretch alternative to modified thomas stretch for variation as needed at home.  EVAL: Patient is a 73 y.o. female who was seen today for physical therapy  evaluation and treatment  for R knee pain. She has had injections in the past, which have helped, but she is no longer eligible due to low platelets. She has pain primarily with walking. She demonstrates B LE weakness, ROM and flexibility deficits and functional weakness with sit to stands and stairs. She will benefit from skilled PT to address these deficits.     OBJECTIVE IMPAIRMENTS: decreased activity tolerance, difficulty walking, decreased ROM, decreased strength, increased edema, impaired flexibility, postural dysfunction, and pain.   ACTIVITY LIMITATIONS: lifting, squatting, stairs, transfers, bed mobility, and locomotion level  PARTICIPATION LIMITATIONS: community activity  PERSONAL FACTORS: Age, Fitness, Time since onset of injury/illness/exacerbation, and 1 comorbidity: OA  are also affecting patient's functional outcome.   REHAB POTENTIAL: Good  CLINICAL DECISION MAKING: Evolving/moderate complexity  EVALUATION COMPLEXITY: Low   SHORT TERM GOALS: Target date: 12/29/2023    Ind with initial HEP Baseline: Goal status: INITIAL  2.  Decreased knee pain by 25%. Baseline:  Goal status: INITIAL   LONG TERM GOALS: Target date: 02/02/2024   Ind with advanced HEP and its progression Baseline:  Goal status: INITIAL  2. Improved R knee ROM to 0-105 deg to normalize gait and functional mobility Baseline:  Goal status: INITIAL  3.  Improved LE strength to 5/5 to improve function Baseline:  Goal status: INITIAL  4.  Able to ascend stairs with a reciprocal gait pattern and one UE  Baseline:  Goal status: INITIAL  5.  Decreased knee pain in the by >=75% with ADLs. Baseline:  Goal status: INITIAL  6.  Improved LEFS  to 63 showing functional improvement Baseline: 54 / 80 = 67.5 % Goal status:INITIAL    PLAN:  PT FREQUENCY: 2x/week  PT DURATION: 8 weeks  PLANNED INTERVENTIONS: 97164- PT Re-evaluation, 97110-Therapeutic exercises, 97530- Therapeutic activity, 97112-  Neuromuscular re-education, 97535- Self Care, 02859- Manual therapy, [id number]- Gait training, [phone]- Aquatic Therapy, 97014- Electrical stimulation (unattended), 97016- Vasopneumatic device, 97033- Ionotophoresis 4mg /ml Dexamethasone , Patient/Family education, Balance training, Stair training, Taping, Dry Needling, Joint mobilization, Cryotherapy, and Moist heat  PLAN FOR NEXT SESSION: R knee ROM, LE strengthening, stairs    Lamarr Price, PTA 12/11/2023, 2:51 PM

## 2023-12-15 NOTE — Therapy (Signed)
 OUTPATIENT PHYSICAL THERAPY LOWER EXTREMITY TREATMENT   Patient Name: Margaret Smith MRN: [id number] DOB:1951/07/11, 73 y.o., female Today's Date: 12/16/2023  END OF SESSION:  PT End of Session - 12/16/23 1320     Visit Number 3    Date for PT Re-Evaluation 02/02/24    Authorization Type MCR    Progress Note Due on Visit 10    PT Start Time 1320    PT Stop Time 1401    PT Time Calculation (min) 41 min    Activity Tolerance Patient tolerated treatment well    Behavior During Therapy [facility] for tasks assessed/performed              Past Medical History:  Diagnosis Date   Hypertension    Pneumonia    Thrombocytopenia (HCC) 11/15/2020   Past Surgical History:  Procedure Laterality Date   CARPAL TUNNEL RELEASE  1980s   bilateral.    LIPOMA EXCISION     left forearm.    REPLACEMENT TOTAL KNEE Left    Dr. Linford   Patient Active Problem List   Diagnosis Date Noted   Encounter for screening for osteoporosis 10/28/2023   Osteoporosis 10/28/2023   Mitral regurgitation 01/24/2023   Tricuspid regurgitation 01/24/2023   OSA on CPAP 11/15/2022   Paroxysmal A-fib (HCC) 11/12/2022   Primary hypertension 09/17/2022   Thrombocytopenia (HCC) 11/15/2020   White coat syndrome without diagnosis of hypertension 01/21/2017   Bradycardia 12/28/2015   Thyromegaly 12/28/2015   Primary osteoarthritis of right knee, post left knee arthroplasty 06/26/2015    PCP: Alvan Dorothyann BIRCH, MD   REFERRING PROVIDER: Curtis Debby PARAS,*   REFERRING DIAG: F82.88 (ICD-10-CM) - Primary osteoarthritis of right knee   THERAPY DIAG:  Chronic pain of right knee  Stiffness of right knee, not elsewhere classified  Muscle weakness (generalized)  Rationale for Evaluation and Treatment: Rehabilitation  ONSET DATE: October 2024 SUBJECTIVE:   SUBJECTIVE STATEMENT: Haven't been able to walk outside. The cold weather affects my heart.   PERTINENT HISTORY: L TKR, HTN, low platelets, R  plantar fasciitis, previous A fib (had Watchman implanted) PAIN:  Are you having pain? Yes: NPRS scale: no pain now, 4-5/10 Pain location: R knee Pain description: sharp, hits and goes Aggravating factors: walking Relieving factors: rest  PRECAUTIONS: None  RED FLAGS: None   WEIGHT BEARING RESTRICTIONS: No  FALLS:  Has patient fallen in last 6 months? No  LIVING ENVIRONMENT: Lives with: lives alone Lives in: House/apartment Stairs: Yes: Internal: 13 steps; on left going up and External: 1 steps; none Has following equipment at home: Single point cane, Walker - 2 wheeled, and Wheelchair (manual)  OCCUPATION: retired  PLOF: Independent  PATIENT GOALS: to be able to walk her hour per day  NEXT MD VISIT: none scheduled  OBJECTIVE:  Note: Objective measures were completed at Evaluation unless otherwise noted.  DIAGNOSTIC FINDINGS: Scheduled  PATIENT SURVEYS:  LEFS 54 / 80 = 67.5 %  COGNITION: Overall cognitive status: Within functional limits for tasks assessed     SENSATION: WFL  EDEMA:  Patient reports it swells intermittently  MUSCLE LENGTH: Marked quad tightness and gastroc B  POSTURE: rounded shoulders, forward head, and flexed trunk  genu valgus B  PALPATION: Lateral right knee  LOWER EXTREMITY ROM:  A/P ROM Right eval Left eval  Knee flexion 99/108 74/80  Knee extension 0 0   (Blank rows = not tested)  LOWER EXTREMITY MMT:  MMT Right eval Left eval  Hip flexion 4- 4-  Hip extension 5 5  Hip abduction 4- 4-  Hip adduction 5 5  Hip internal rotation    Hip external rotation    Knee flexion 4- 5  Knee extension 4+ 5  Ankle dorsiflexion 5 5   (Blank rows = not tested)   FUNCTIONAL TESTS:  5 times sit to stand: 14.17 sec Stairs - eccentric weakness R, hyperextends right with ascending  GAIT: Distance walked: 20 Assistive device utilized: None Level of assistance: Complete Independence Comments: genu valgus B     [facility] Adult PT  Treatment:                                                DATE: 12/16/2023 Therapeutic Exercise: NuStep L5 x 5 min Step ups 6 inch step L x 20, 4 inch step L x 10 focus on controlling hyper ext) TKE into ball 5 sec hold x 10 SAQ (green bolster) + 5#AW 5 sec hold 2x10 SLR in parallel 2 x10, ER 1x10 Bridges 2x15 S/L clamshells + RTB 2x10 S/L straight leg hip abd + RTB 2x10 - positioned in 45 deg hip flexion to isolate gluteals Prone quad stretch with strap 2x30 sec                                                                                                                             [facility] Adult PT Treatment:                                                DATE: 12/11/2023 Therapeutic Exercise: NuStep L5 x 5 min SAQ (green bolster) + 5#AW 2x15 SLR in parallel x10, ER 2x10 Bridges 2x15 S/L clamshells + RTB 2x10 S/L straight leg hip abd + RTB 2x10 Prone quad stretch with strap   TREATMENT DATE:   12/08/23 See pt ed and HEP   PATIENT EDUCATION:  Education details: PT eval findings, anticipated POC, and initial HEP  Person educated: Patient Education method: Explanation, Demonstration, and Handouts Education comprehension: verbalized understanding and returned demonstration  HOME EXERCISE PROGRAM: Access Code: [email or web address] URL: [email or web address] Date: 12/11/2023 Prepared by: Lamarr Price  Exercises - Supine Quadriceps Stretch with Strap on Table (Mirrored)  - 2 x daily - 7 x weekly - 1 sets - 3 reps - 30-60 sec hold - Supine Active Straight Leg Raise  - 2 x daily - 7 x weekly - 3 sets - 10 reps - Standing Hip Abduction with Counter Support  - 1 x daily - 7 x weekly - 2 sets - 10 reps - Sit to Stand  - 3 x daily - 7 x weekly - 1-2 sets - 10 reps - Clam with Resistance  -  1 x daily - 7 x weekly - 3 sets - 10 reps - Sidelying Hip Abduction with Resistance at Thighs  - 1 x daily - 7 x weekly - 3 sets - 10 reps - Prone Quadriceps Stretch with Strap  - 1 x daily - 7 x  weekly - 1 sets - 3-5 reps - 10-30 sec hold  ASSESSMENT:  CLINICAL IMPRESSION: Pam demonstrates significant hyperextension of R knee with step ups. Focused on quad strengthening as well as hips today. She needs some cueing for correct form with clams to avoid post rolling. We modified hip ABD in S/L to be done at 45 degrees hip flexion as she was feeling it more in the lateral leg than glutes.  EVAL: Patient is a 73 y.o. female who was seen today for physical therapy evaluation and treatment for R knee pain. She has had injections in the past, which have helped, but she is no longer eligible due to low platelets. She has pain primarily with walking. She demonstrates B LE weakness, ROM and flexibility deficits and functional weakness with sit to stands and stairs. She will benefit from skilled PT to address these deficits.     OBJECTIVE IMPAIRMENTS: decreased activity tolerance, difficulty walking, decreased ROM, decreased strength, increased edema, impaired flexibility, postural dysfunction, and pain.   ACTIVITY LIMITATIONS: lifting, squatting, stairs, transfers, bed mobility, and locomotion level  PARTICIPATION LIMITATIONS: community activity  PERSONAL FACTORS: Age, Fitness, Time since onset of injury/illness/exacerbation, and 1 comorbidity: OA  are also affecting patient's functional outcome.   REHAB POTENTIAL: Good  CLINICAL DECISION MAKING: Evolving/moderate complexity  EVALUATION COMPLEXITY: Low   SHORT TERM GOALS: Target date: 12/29/2023    Ind with initial HEP Baseline: Goal status: INITIAL  2.  Decreased knee pain by 25%. Baseline:  Goal status: INITIAL   LONG TERM GOALS: Target date: 02/02/2024   Ind with advanced HEP and its progression Baseline:  Goal status: INITIAL  2. Improved R knee ROM to 0-105 deg to normalize gait and functional mobility Baseline:  Goal status: INITIAL  3.  Improved LE strength to 5/5 to improve function Baseline:  Goal status:  INITIAL  4.  Able to ascend stairs with a reciprocal gait pattern and one UE  Baseline:  Goal status: INITIAL  5.  Decreased knee pain in the by >=75% with ADLs. Baseline:  Goal status: INITIAL  6.  Improved LEFS  to 63 showing functional improvement Baseline: 54 / 80 = 67.5 % Goal status:INITIAL    PLAN:  PT FREQUENCY: 2x/week  PT DURATION: 8 weeks  PLANNED INTERVENTIONS: 97164- PT Re-evaluation, 97110-Therapeutic exercises, 97530- Therapeutic activity, 97112- Neuromuscular re-education, 97535- Self Care, 02859- Manual therapy, [id number]- Gait training, [phone]- Aquatic Therapy, 97014- Electrical stimulation (unattended), 97016- Vasopneumatic device, 97033- Ionotophoresis 4mg /ml Dexamethasone , Patient/Family education, Balance training, Stair training, Taping, Dry Needling, Joint mobilization, Cryotherapy, and Moist heat  PLAN FOR NEXT SESSION: R knee ROM, LE strengthening, stairs    Mliss Cummins, PT  12/16/2023, 2:03 PM

## 2023-12-16 ENCOUNTER — Encounter: Payer: Self-pay | Admitting: Physical Therapy

## 2023-12-16 ENCOUNTER — Ambulatory Visit: Payer: Medicare Other | Admitting: Physical Therapy

## 2023-12-16 DIAGNOSIS — G8929 Other chronic pain: Secondary | ICD-10-CM

## 2023-12-16 DIAGNOSIS — M25661 Stiffness of right knee, not elsewhere classified: Secondary | ICD-10-CM | POA: Diagnosis not present

## 2023-12-16 DIAGNOSIS — M6281 Muscle weakness (generalized): Secondary | ICD-10-CM

## 2023-12-16 DIAGNOSIS — M25561 Pain in right knee: Secondary | ICD-10-CM | POA: Diagnosis not present

## 2023-12-17 ENCOUNTER — Other Ambulatory Visit: Payer: Self-pay

## 2023-12-17 DIAGNOSIS — I10 Essential (primary) hypertension: Secondary | ICD-10-CM

## 2023-12-17 MED ORDER — LOSARTAN POTASSIUM 25 MG PO TABS
25.0000 mg | ORAL_TABLET | Freq: Every day | ORAL | 1 refills | Status: DC
Start: 2023-12-17 — End: 2024-02-13

## 2023-12-17 NOTE — Telephone Encounter (Signed)
 Refill sent. Nxt appt with Dr. Alvan: 02/02/24    Copied from CRM [id number]. Topic: Clinical - Prescription Issue >> Dec 17, 2023  9:13 AM Margaret Smith wrote: Reason for CRM:  losartan  (COZAAR ) 25 MG tablet Patient needs the remaining refill of this too be sent to [facility] in [place] at [address], [place], [place] [address] P: [phone]. If a refill is not available, patient is requesting a refill. Patient will be out of medicine very soon.

## 2023-12-18 ENCOUNTER — Ambulatory Visit: Payer: Medicare Other

## 2023-12-18 ENCOUNTER — Ambulatory Visit (INDEPENDENT_AMBULATORY_CARE_PROVIDER_SITE_OTHER): Payer: Medicare Other

## 2023-12-18 ENCOUNTER — Ambulatory Visit (INDEPENDENT_AMBULATORY_CARE_PROVIDER_SITE_OTHER): Payer: Medicare Other | Admitting: Podiatry

## 2023-12-18 DIAGNOSIS — Z96652 Presence of left artificial knee joint: Secondary | ICD-10-CM | POA: Diagnosis not present

## 2023-12-18 DIAGNOSIS — M1711 Unilateral primary osteoarthritis, right knee: Secondary | ICD-10-CM | POA: Diagnosis not present

## 2023-12-18 DIAGNOSIS — G8929 Other chronic pain: Secondary | ICD-10-CM

## 2023-12-18 DIAGNOSIS — M25661 Stiffness of right knee, not elsewhere classified: Secondary | ICD-10-CM | POA: Diagnosis not present

## 2023-12-18 DIAGNOSIS — M11261 Other chondrocalcinosis, right knee: Secondary | ICD-10-CM | POA: Diagnosis not present

## 2023-12-18 DIAGNOSIS — M25561 Pain in right knee: Secondary | ICD-10-CM | POA: Diagnosis not present

## 2023-12-18 DIAGNOSIS — Z0189 Encounter for other specified special examinations: Secondary | ICD-10-CM | POA: Diagnosis not present

## 2023-12-18 DIAGNOSIS — Z471 Aftercare following joint replacement surgery: Secondary | ICD-10-CM | POA: Diagnosis not present

## 2023-12-18 DIAGNOSIS — L603 Nail dystrophy: Secondary | ICD-10-CM | POA: Diagnosis not present

## 2023-12-18 DIAGNOSIS — M6281 Muscle weakness (generalized): Secondary | ICD-10-CM | POA: Diagnosis not present

## 2023-12-18 NOTE — Progress Notes (Signed)
  Subjective:  Patient ID: Margaret Smith, female    DOB: 09-30-1951,   MRN: [id number]  No chief complaint on file.   73 y.o. female presents for follow-up of nail changes and to review culture results.   . Denies any other pedal complaints. Denies n/v/f/c.   Past Medical History:  Diagnosis Date   Hypertension    Pneumonia    Thrombocytopenia (HCC) 11/15/2020    Objective:  Physical Exam: Vascular: DP/PT pulses 2/4 bilateral. CFT <3 seconds. Normal hair growth on digits. No edema.  Skin. No lacerations or abrasions bilateral feet. Left third digit discolored with dark streaks noted and possible some hemorrhage. No pain Does appear to be improving.  Musculoskeletal: MMT 5/5 bilateral lower extremities in DF, PF, Inversion and Eversion. Deceased ROM in DF of ankle joint.  Neurological: Sensation intact to light touch.   Assessment:   1. Dystrophic nail       Plan:  Patient was evaluated and treated and all questions answered. -Examined patient -Discussed treatment options for painful dystrophic nails  -Culuture negative for fungus or melanoma. Some hemorrhagic trauma noted.   -Discussed treatment options including shoe gear modification and time.  -Patient to return as needed   Asberry Failing, DPM

## 2023-12-18 NOTE — Therapy (Signed)
 OUTPATIENT PHYSICAL THERAPY LOWER EXTREMITY TREATMENT   Patient Name: Margaret Smith MRN: [id number] DOB:01/23/1951, 73 y.o., female Today's Date: 12/18/2023  END OF SESSION:  PT End of Session - 12/18/23 1146     Visit Number 4    Date for PT Re-Evaluation 02/02/24    Authorization Type MCR    Progress Note Due on Visit 10    PT Start Time 1148    PT Stop Time 1233    PT Time Calculation (min) 45 min    Activity Tolerance Patient tolerated treatment well    Behavior During Therapy [facility] for tasks assessed/performed            Past Medical History:  Diagnosis Date   Hypertension    Pneumonia    Thrombocytopenia (HCC) 11/15/2020   Past Surgical History:  Procedure Laterality Date   CARPAL TUNNEL RELEASE  1980s   bilateral.    LIPOMA EXCISION     left forearm.    REPLACEMENT TOTAL KNEE Left    Dr. Linford   Patient Active Problem List   Diagnosis Date Noted   Encounter for screening for osteoporosis 10/28/2023   Osteoporosis 10/28/2023   Mitral regurgitation 01/24/2023   Tricuspid regurgitation 01/24/2023   OSA on CPAP 11/15/2022   Paroxysmal A-fib (HCC) 11/12/2022   Primary hypertension 09/17/2022   Thrombocytopenia (HCC) 11/15/2020   White coat syndrome without diagnosis of hypertension 01/21/2017   Bradycardia 12/28/2015   Thyromegaly 12/28/2015   Primary osteoarthritis of right knee, post left knee arthroplasty 06/26/2015    PCP: Alvan Dorothyann BIRCH, MD   REFERRING PROVIDER: Curtis Debby PARAS,*   REFERRING DIAG: F82.88 (ICD-10-CM) - Primary osteoarthritis of right knee   THERAPY DIAG:  Chronic pain of right knee  Stiffness of right knee, not elsewhere classified  Muscle weakness (generalized)  Rationale for Evaluation and Treatment: Rehabilitation  ONSET DATE: October 2024 SUBJECTIVE:   SUBJECTIVE STATEMENT: Patient states she feels her lack of walking is affecting her heart, states she is unsure of what to do since it's too cold to walk  outside; states she has mild pain at lower lateral knee.   PERTINENT HISTORY: L TKR, HTN, low platelets, R plantar fasciitis, previous A fib (had Watchman implanted) PAIN:  Are you having pain? Yes: NPRS scale: no pain now, 4-5/10 Pain location: R knee Pain description: sharp, hits and goes Aggravating factors: walking Relieving factors: rest  PRECAUTIONS: None  RED FLAGS: None   WEIGHT BEARING RESTRICTIONS: No  FALLS:  Has patient fallen in last 6 months? No  LIVING ENVIRONMENT: Lives with: lives alone Lives in: House/apartment Stairs: Yes: Internal: 13 steps; on left going up and External: 1 steps; none Has following equipment at home: Single point cane, Walker - 2 wheeled, and Wheelchair (manual)  OCCUPATION: retired  PLOF: Independent  PATIENT GOALS: to be able to walk her hour per day  NEXT MD VISIT: none scheduled  OBJECTIVE:  Note: Objective measures were completed at Evaluation unless otherwise noted.  DIAGNOSTIC FINDINGS: Scheduled  PATIENT SURVEYS:  LEFS 54 / 80 = 67.5 %  COGNITION: Overall cognitive status: Within functional limits for tasks assessed     SENSATION: WFL  EDEMA:  Patient reports it swells intermittently  MUSCLE LENGTH: Marked quad tightness and gastroc B  POSTURE: rounded shoulders, forward head, and flexed trunk  genu valgus B  PALPATION: Lateral right knee  LOWER EXTREMITY ROM:  A/P ROM Right eval Left eval  Knee flexion 99/108 74/80  Knee extension 0 0   (  Blank rows = not tested)  LOWER EXTREMITY MMT:  MMT Right eval Left eval  Hip flexion 4- 4-  Hip extension 5 5  Hip abduction 4- 4-  Hip adduction 5 5  Hip internal rotation    Hip external rotation    Knee flexion 4- 5  Knee extension 4+ 5  Ankle dorsiflexion 5 5   (Blank rows = not tested)   FUNCTIONAL TESTS:  5 times sit to stand: 14.17 sec Stairs - eccentric weakness R, hyperextends right with ascending  GAIT: Distance walked: 20 Assistive  device utilized: None Level of assistance: Complete Independence Comments: genu valgus B   [facility] Adult PT Treatment:                                                DATE: 12/18/2023 Therapeutic Exercise: NuStep L6 x SAQ (green bolster) + 5#AW 2x10x5 SLR in parallel 10x5 Side Lying (back against wall): Clamshells Straight leg hip abd + YTB above knees Straight leg hip abd in ER Hooklying clamshells + black TB x30 Bridges + hip abd iso with black TB x20 Standing hip abd + blue TB above knees (toe tap position) Hip extension on diagonal (discontinued d/t no glute activation) Runner's lunge stretch 2x30  Self Care: Video workouts via youtube (cardiac/senior focused)   [facility] Adult PT Treatment:                                                DATE: 12/16/2023 Therapeutic Exercise: NuStep L5 x 5 min Step ups 6 inch step L x 20, 4 inch step L x 10 focus on controlling hyper ext) TKE into ball 5 sec hold x 10 SAQ (green bolster) + 5#AW 5 sec hold 2x10 SLR in parallel 2 x10, ER 1x10 Bridges 2x15 S/L clamshells + RTB 2x10 S/L straight leg hip abd + RTB 2x10 - positioned in 45 deg hip flexion to isolate gluteals Prone quad stretch with strap 2x30 sec                                                                                                                             [facility] Adult PT Treatment:                                                DATE: 12/11/2023 Therapeutic Exercise: NuStep L5 x 5 min SAQ (green bolster) + 5#AW 2x15 SLR in parallel x10, ER 2x10 Bridges 2x15 S/L clamshells + RTB 2x10 S/L straight leg hip abd + RTB 2x10 Prone quad stretch with  strap   PATIENT EDUCATION:  Education details: Updated HEP  Person educated: Patient Education method: Explanation, Demonstration, and Handouts Education comprehension: verbalized understanding and returned demonstration  HOME EXERCISE PROGRAM: Access Code: [email or web address] URL: [email or web address] Date:  12/11/2023 Prepared by: Lamarr Price  Exercises - Supine Quadriceps Stretch with Strap on Table (Mirrored)  - 2 x daily - 7 x weekly - 1 sets - 3 reps - 30-60 sec hold - Supine Active Straight Leg Raise  - 2 x daily - 7 x weekly - 3 sets - 10 reps - Standing Hip Abduction with Counter Support  - 1 x daily - 7 x weekly - 2 sets - 10 reps - Sit to Stand  - 3 x daily - 7 x weekly - 1-2 sets - 10 reps - Clam with Resistance  - 1 x daily - 7 x weekly - 3 sets - 10 reps - Sidelying Hip Abduction with Resistance at Thighs  - 1 x daily - 7 x weekly - 3 sets - 10 reps - Prone Quadriceps Stretch with Strap  - 1 x daily - 7 x weekly - 1 sets - 3-5 reps - 10-30 sec hold  ASSESSMENT:  CLINICAL IMPRESSION: Hip ER and abduction strengthening exercises progressed, providing tactile cues as needed for postural stabilization. Patient had difficulty properly activating glutes and required tactile cues to maintain proper hip/leg alignment. Provided search recommendation of online videos for low impact cardio workouts for seniors for patient to perform at home.  EVAL: Patient is a 73 y.o. female who was seen today for physical therapy evaluation and treatment for R knee pain. She has had injections in the past, which have helped, but she is no longer eligible due to low platelets. She has pain primarily with walking. She demonstrates B LE weakness, ROM and flexibility deficits and functional weakness with sit to stands and stairs. She will benefit from skilled PT to address these deficits.     OBJECTIVE IMPAIRMENTS: decreased activity tolerance, difficulty walking, decreased ROM, decreased strength, increased edema, impaired flexibility, postural dysfunction, and pain.   ACTIVITY LIMITATIONS: lifting, squatting, stairs, transfers, bed mobility, and locomotion level  PARTICIPATION LIMITATIONS: community activity  PERSONAL FACTORS: Age, Fitness, Time since onset of injury/illness/exacerbation, and 1 comorbidity:  OA  are also affecting patient's functional outcome.   REHAB POTENTIAL: Good  CLINICAL DECISION MAKING: Evolving/moderate complexity  EVALUATION COMPLEXITY: Low   SHORT TERM GOALS: Target date: 12/29/2023    Ind with initial HEP Baseline: Goal status: INITIAL  2.  Decreased knee pain by 25%. Baseline:  Goal status: INITIAL   LONG TERM GOALS: Target date: 02/02/2024   Ind with advanced HEP and its progression Baseline:  Goal status: INITIAL  2. Improved R knee ROM to 0-105 deg to normalize gait and functional mobility Baseline:  Goal status: INITIAL  3.  Improved LE strength to 5/5 to improve function Baseline:  Goal status: INITIAL  4.  Able to ascend stairs with a reciprocal gait pattern and one UE  Baseline:  Goal status: INITIAL  5.  Decreased knee pain in the by >=75% with ADLs. Baseline:  Goal status: INITIAL  6.  Improved LEFS  to 63 showing functional improvement Baseline: 54 / 80 = 67.5 % Goal status:INITIAL    PLAN:  PT FREQUENCY: 2x/week  PT DURATION: 8 weeks  PLANNED INTERVENTIONS: 97164- PT Re-evaluation, 97110-Therapeutic exercises, 97530- Therapeutic activity, 97112- Neuromuscular re-education, 97535- Self Care, 02859- Manual therapy, [id number]- Gait training, [id number]- Aquatic Therapy, 97014- Electrical stimulation (  unattended), 97016- Vasopneumatic device, [phone]- Ionotophoresis 4mg /ml Dexamethasone , Patient/Family education, Balance training, Stair training, Taping, Dry Needling, Joint mobilization, Cryotherapy, and Moist heat  PLAN FOR NEXT SESSION: R knee ROM, LE/glute/hip ER strengthening, stairs    Lamarr Price, PTA 12/18/2023, 12:34 PM

## 2023-12-22 ENCOUNTER — Ambulatory Visit: Payer: Medicare Other

## 2023-12-22 DIAGNOSIS — G8929 Other chronic pain: Secondary | ICD-10-CM | POA: Diagnosis not present

## 2023-12-22 DIAGNOSIS — M25561 Pain in right knee: Secondary | ICD-10-CM | POA: Diagnosis not present

## 2023-12-22 DIAGNOSIS — M25661 Stiffness of right knee, not elsewhere classified: Secondary | ICD-10-CM

## 2023-12-22 DIAGNOSIS — M6281 Muscle weakness (generalized): Secondary | ICD-10-CM | POA: Diagnosis not present

## 2023-12-22 NOTE — Therapy (Signed)
 OUTPATIENT PHYSICAL THERAPY LOWER EXTREMITY TREATMENT   Patient Name: Margaret Smith MRN: [id number] DOB:August 02, 1951, 73 y.o., female Today's Date: 12/22/2023  END OF SESSION:  PT End of Session - 12/22/23 1535     Visit Number 5    Date for PT Re-Evaluation 02/02/24    Authorization Type MCR    Progress Note Due on Visit 10    PT Start Time 1533    PT Stop Time 1618    PT Time Calculation (min) 45 min    Activity Tolerance Patient tolerated treatment well    Behavior During Therapy [facility] for tasks assessed/performed            Past Medical History:  Diagnosis Date   Hypertension    Pneumonia    Thrombocytopenia (HCC) 11/15/2020   Past Surgical History:  Procedure Laterality Date   CARPAL TUNNEL RELEASE  1980s   bilateral.    LIPOMA EXCISION     left forearm.    REPLACEMENT TOTAL KNEE Left    Dr. Linford   Patient Active Problem List   Diagnosis Date Noted   Encounter for screening for osteoporosis 10/28/2023   Osteoporosis 10/28/2023   Mitral regurgitation 01/24/2023   Tricuspid regurgitation 01/24/2023   OSA on CPAP 11/15/2022   Paroxysmal A-fib (HCC) 11/12/2022   Primary hypertension 09/17/2022   Thrombocytopenia (HCC) 11/15/2020   White coat syndrome without diagnosis of hypertension 01/21/2017   Bradycardia 12/28/2015   Thyromegaly 12/28/2015   Primary osteoarthritis of right knee, post left knee arthroplasty 06/26/2015    PCP: Alvan Dorothyann BIRCH, MD   REFERRING PROVIDER: Curtis Debby PARAS,*   REFERRING DIAG: F82.88 (ICD-10-CM) - Primary osteoarthritis of right knee   THERAPY DIAG:  Chronic pain of right knee  Stiffness of right knee, not elsewhere classified  Muscle weakness (generalized)  Rationale for Evaluation and Treatment: Rehabilitation  ONSET DATE: October 2024 SUBJECTIVE:   SUBJECTIVE STATEMENT: Patient reports she got out for a walk today and her heart felt good.   PERTINENT HISTORY: L TKR, HTN, low platelets, R  plantar fasciitis, previous A fib (had Watchman implanted) PAIN:  Are you having pain? Yes: NPRS scale: no pain now, 4-5/10 Pain location: R knee Pain description: sharp, hits and goes Aggravating factors: walking Relieving factors: rest  PRECAUTIONS: None  RED FLAGS: None   WEIGHT BEARING RESTRICTIONS: No  FALLS:  Has patient fallen in last 6 months? No  LIVING ENVIRONMENT: Lives with: lives alone Lives in: House/apartment Stairs: Yes: Internal: 13 steps; on left going up and External: 1 steps; none Has following equipment at home: Single point cane, Walker - 2 wheeled, and Wheelchair (manual)  OCCUPATION: retired  PLOF: Independent  PATIENT GOALS: to be able to walk her hour per day  NEXT MD VISIT: none scheduled  OBJECTIVE:  Note: Objective measures were completed at Evaluation unless otherwise noted.  DIAGNOSTIC FINDINGS: Scheduled  PATIENT SURVEYS:  LEFS 54 / 80 = 67.5 %  COGNITION: Overall cognitive status: Within functional limits for tasks assessed     SENSATION: WFL  EDEMA:  Patient reports it swells intermittently  MUSCLE LENGTH: Marked quad tightness and gastroc B  POSTURE: rounded shoulders, forward head, and flexed trunk  genu valgus B  PALPATION: Lateral right knee  LOWER EXTREMITY ROM:  A/P ROM Right eval Left eval  Knee flexion 99/108 74/80  Knee extension 0 0   (Blank rows = not tested)  LOWER EXTREMITY MMT:  MMT Right eval Left eval  Hip flexion 4- 4-  Hip extension 5 5  Hip abduction 4- 4-  Hip adduction 5 5  Hip internal rotation    Hip external rotation    Knee flexion 4- 5  Knee extension 4+ 5  Ankle dorsiflexion 5 5   (Blank rows = not tested)   FUNCTIONAL TESTS:  5 times sit to stand: 14.17 sec Stairs - eccentric weakness R, hyperextends right with ascending  GAIT: Distance walked: 20 Assistive device utilized: None Level of assistance: Complete Independence Comments: genu valgus B   [facility] Adult PT  Treatment:                                            DATE: 12/22/2023 Therapeutic Exercise: Side Lying: Clamshells --> feet against wall Straight leg hip abd (back against wall) --> pulses --> small circles CW/CCW Seated hip ER --> YTB Hooklying clamshells + GTB (needed more resistance) Side stepping with orange superband (didn't feel much) Standing hip abd + RTB below knees 2x10 4 step fwd weight shifting/loading on R LE (painful) Seated half long sit HS & ADD stretches (R)   [facility] Adult PT Treatment:                                                DATE: 12/18/2023 Therapeutic Exercise: NuStep L6 x SAQ (green bolster) + 5#AW 2x10x5 SLR in parallel 10x5 Side Lying (back against wall): Clamshells Straight leg hip abd + YTB above knees Straight leg hip abd in ER Hooklying clamshells + black TB x30 Bridges + hip abd iso with black TB x20 Standing hip abd + blue TB above knees (toe tap position) Hip extension on diagonal (discontinued d/t no glute activation) Runner's lunge stretch 2x30  Self Care: Video workouts via youtube (cardiac/senior focused)   [facility] Adult PT Treatment:                                                DATE: 12/16/2023 Therapeutic Exercise: NuStep L5 x 5 min Step ups 6 inch step L x 20, 4 inch step L x 10 focus on controlling hyper ext) TKE into ball 5 sec hold x 10 SAQ (green bolster) + 5#AW 5 sec hold 2x10 SLR in parallel 2 x10, ER 1x10 Bridges 2x15 S/L clamshells + RTB 2x10 S/L straight leg hip abd + RTB 2x10 - positioned in 45 deg hip flexion to isolate gluteals Prone quad stretch with strap 2x30 sec  PATIENT EDUCATION:  Education details: Updated HEP Person educated: Patient Education method: Explanation, Demonstration, and Handouts Education comprehension: verbalized understanding and returned demonstration  HOME EXERCISE  PROGRAM: Access Code: [email or web address] URL: [email or web address] Date: 12/11/2023 Prepared by: Lamarr Price  Exercises - Supine Quadriceps Stretch with Strap on Table (Mirrored)  - 2 x daily - 7 x weekly - 1 sets - 3 reps - 30-60 sec hold - Supine Active Straight Leg Raise  - 2 x daily - 7 x weekly - 3 sets - 10 reps - Standing Hip Abduction with Counter Support  - 1 x daily - 7 x weekly - 2 sets - 10 reps - Sit to Stand  - 3 x daily - 7 x weekly - 1-2 sets - 10 reps - Clam with Resistance  - 1 x daily - 7 x weekly - 3 sets - 10 reps - Sidelying Hip Abduction with Resistance at Thighs  - 1 x daily - 7 x weekly - 3 sets - 10 reps - Prone Quadriceps Stretch with Strap  - 1 x daily - 7 x weekly - 1 sets - 3-5 reps - 10-30 sec hold  ASSESSMENT:  CLINICAL IMPRESSION: Hip strengthening exercises continued; cueing feet braced against wall improved posterior hip/glute activation with clamshell variation. Hip ER compensation exhibited during standing hip abduction; tactile cues improved alignment, however patient had difficulty maintaining cues.  EVAL: Patient is a 73 y.o. female who was seen today for physical therapy evaluation and treatment for R knee pain. She has had injections in the past, which have helped, but she is no longer eligible due to low platelets. She has pain primarily with walking. She demonstrates B LE weakness, ROM and flexibility deficits and functional weakness with sit to stands and stairs. She will benefit from skilled PT to address these deficits.     OBJECTIVE IMPAIRMENTS: decreased activity tolerance, difficulty walking, decreased ROM, decreased strength, increased edema, impaired flexibility, postural dysfunction, and pain.   ACTIVITY LIMITATIONS: lifting, squatting, stairs, transfers, bed mobility, and locomotion level  PARTICIPATION LIMITATIONS: community activity  PERSONAL FACTORS: Age, Fitness, Time since onset of injury/illness/exacerbation, and 1  comorbidity: OA  are also affecting patient's functional outcome.   REHAB POTENTIAL: Good  CLINICAL DECISION MAKING: Evolving/moderate complexity  EVALUATION COMPLEXITY: Low   SHORT TERM GOALS: Target date: 12/29/2023    Ind with initial HEP Baseline: Goal status: INITIAL  2.  Decreased knee pain by 25%. Baseline:  Goal status: INITIAL   LONG TERM GOALS: Target date: 02/02/2024   Ind with advanced HEP and its progression Baseline:  Goal status: INITIAL  2. Improved R knee ROM to 0-105 deg to normalize gait and functional mobility Baseline:  Goal status: INITIAL  3.  Improved LE strength to 5/5 to improve function Baseline:  Goal status: INITIAL  4.  Able to ascend stairs with a reciprocal gait pattern and one UE  Baseline:  Goal status: INITIAL  5.  Decreased knee pain in the by >=75% with ADLs. Baseline:  Goal status: INITIAL  6.  Improved LEFS  to 63 showing functional improvement Baseline: 54 / 80 = 67.5 % Goal status:INITIAL    PLAN:  PT FREQUENCY: 2x/week  PT DURATION: 8 weeks  PLANNED INTERVENTIONS: 97164- PT Re-evaluation, 97110-Therapeutic exercises, 97530- Therapeutic activity, 97112- Neuromuscular re-education, 97535- Self Care, 02859- Manual therapy, [id number]- Gait training, [phone]- Aquatic Therapy, 97014- Electrical stimulation (unattended), 97016- Vasopneumatic device, [id number]- Ionotophoresis 4mg /ml Dexamethasone , Patient/Family education, Balance training, Stair training, Taping, Dry Needling, Joint mobilization,  Cryotherapy, and Moist heat  PLAN FOR NEXT SESSION: R knee ROM, LE/glute/hip ER strengthening, stairs    Lamarr Price, PTA 12/22/2023, 4:19 PM

## 2023-12-24 ENCOUNTER — Ambulatory Visit (INDEPENDENT_AMBULATORY_CARE_PROVIDER_SITE_OTHER): Payer: Medicare Other

## 2023-12-24 ENCOUNTER — Ambulatory Visit: Payer: Medicare Other | Admitting: Physical Therapy

## 2023-12-24 ENCOUNTER — Ambulatory Visit: Payer: Medicare Other

## 2023-12-24 ENCOUNTER — Encounter: Payer: Self-pay | Admitting: Physical Therapy

## 2023-12-24 DIAGNOSIS — M85831 Other specified disorders of bone density and structure, right forearm: Secondary | ICD-10-CM | POA: Diagnosis not present

## 2023-12-24 DIAGNOSIS — Z1231 Encounter for screening mammogram for malignant neoplasm of breast: Secondary | ICD-10-CM | POA: Diagnosis not present

## 2023-12-24 DIAGNOSIS — M810 Age-related osteoporosis without current pathological fracture: Secondary | ICD-10-CM | POA: Diagnosis not present

## 2023-12-24 DIAGNOSIS — M6281 Muscle weakness (generalized): Secondary | ICD-10-CM | POA: Diagnosis not present

## 2023-12-24 DIAGNOSIS — G8929 Other chronic pain: Secondary | ICD-10-CM | POA: Diagnosis not present

## 2023-12-24 DIAGNOSIS — M25661 Stiffness of right knee, not elsewhere classified: Secondary | ICD-10-CM | POA: Diagnosis not present

## 2023-12-24 DIAGNOSIS — M25561 Pain in right knee: Secondary | ICD-10-CM | POA: Diagnosis not present

## 2023-12-24 NOTE — Therapy (Signed)
 OUTPATIENT PHYSICAL THERAPY LOWER EXTREMITY TREATMENT   Patient Name: Margaret Smith MRN: [id number] DOB:1951/01/26, 73 y.o., female Today's Date: 12/24/2023  END OF SESSION:  PT End of Session - 12/24/23 1524     Visit Number 6    Date for PT Re-Evaluation 02/02/24    Authorization Type MCR    Progress Note Due on Visit 10    PT Start Time 1526    PT Stop Time 1605    PT Time Calculation (min) 39 min    Activity Tolerance Patient tolerated treatment well             Past Medical History:  Diagnosis Date   Hypertension    Pneumonia    Thrombocytopenia (HCC) 11/15/2020   Past Surgical History:  Procedure Laterality Date   CARPAL TUNNEL RELEASE  1980s   bilateral.    LIPOMA EXCISION     left forearm.    REPLACEMENT TOTAL KNEE Left    Dr. Chaneta Comer   Patient Active Problem List   Diagnosis Date Noted   Encounter for screening for osteoporosis 10/28/2023   Osteoporosis 10/28/2023   Mitral regurgitation 01/24/2023   Tricuspid regurgitation 01/24/2023   OSA on CPAP 11/15/2022   Paroxysmal A-fib (HCC) 11/12/2022   Primary hypertension 09/17/2022   Thrombocytopenia (HCC) 11/15/2020   White coat syndrome without diagnosis of hypertension 01/21/2017   Bradycardia 12/28/2015   Thyromegaly 12/28/2015   Primary osteoarthritis of right knee, post left knee arthroplasty 06/26/2015    PCP: Cydney Draft, MD   REFERRING PROVIDER: Gean Keels,*   REFERRING DIAG: W09.81 (ICD-10-CM) - Primary osteoarthritis of right knee   THERAPY DIAG:  Chronic pain of right knee  Stiffness of right knee, not elsewhere classified  Muscle weakness (generalized)  Rationale for Evaluation and Treatment: Rehabilitation  ONSET DATE: October 2024 SUBJECTIVE:   SUBJECTIVE STATEMENT: Reports being pretty busy today with medical appts. Having more pain this morning, about a 7/10 but improved this afternoon. Pt states she did have a bit more pain after last time but also  did a lot of walking and more activity. No other new updates.    PERTINENT HISTORY: L TKR, HTN, low platelets, R plantar fasciitis, previous A fib (had Watchman implanted) PAIN:  Are you having pain? Yes: NPRS scale: no pain now, 4-5/10 Pain location: R knee Pain description: sharp, hits and goes Aggravating factors: walking Relieving factors: rest  PRECAUTIONS: None  RED FLAGS: None   WEIGHT BEARING RESTRICTIONS: No  FALLS:  Has patient fallen in last 6 months? No  LIVING ENVIRONMENT: Lives with: lives alone Lives in: House/apartment Stairs: Yes: Internal: 13 steps; on left going up and External: 1 steps; none Has following equipment at home: Single point cane, Walker - 2 wheeled, and Wheelchair (manual)  OCCUPATION: retired  PLOF: Independent  PATIENT GOALS: to be able to walk her hour per day  NEXT MD VISIT: none scheduled  OBJECTIVE:  Note: Objective measures were completed at Evaluation unless otherwise noted.  DIAGNOSTIC FINDINGS: Scheduled  PATIENT SURVEYS:  LEFS 54 / 80 = 67.5 %  COGNITION: Overall cognitive status: Within functional limits for tasks assessed     SENSATION: WFL  EDEMA:  Patient reports it swells intermittently  MUSCLE LENGTH: Marked quad tightness and gastroc B  POSTURE: rounded shoulders, forward head, and flexed trunk  genu valgus B  PALPATION: Lateral right knee  LOWER EXTREMITY ROM:  A/P ROM Right eval Left eval  Knee flexion 99/108 74/80  Knee extension  0 0   (Blank rows = not tested)  LOWER EXTREMITY MMT:  MMT Right eval Left eval  Hip flexion 4- 4-  Hip extension 5 5  Hip abduction 4- 4-  Hip adduction 5 5  Hip internal rotation    Hip external rotation    Knee flexion 4- 5  Knee extension 4+ 5  Ankle dorsiflexion 5 5   (Blank rows = not tested)   FUNCTIONAL TESTS:  5 times sit to stand: 14.17 sec Stairs - eccentric weakness R, hyperextends right with ascending  GAIT: Distance walked:  20 Assistive device utilized: None Level of assistance: Complete Independence Comments: genu valgus B    [facility] Adult PT Treatment:                                                DATE: 12/24/23 Therapeutic Exercise: Clamshells 2x8, hooklying (pt purple band) cues for knee positioning  Hooklying march (pt purple band) 2x8 BIL LE Seated TKE into bosu 2x10  BIL LE cues for quad contractions 4 inch step weight shift RLE 2x8 w/ BIL UE support cues for reduced anterior tibial translation Seated hip abduction purple band 2x10 cues for form and posture STS from raised mat (+2 airex) 3x5 cues for BOS    [facility] Adult PT Treatment:                                            DATE: 12/22/2023 Therapeutic Exercise: Side Lying: Clamshells --> feet against wall Straight leg hip abd (back against wall) --> pulses --> small circles CW/CCW Seated hip ER --> YTB Hooklying clamshells + GTB (needed more resistance) Side stepping with orange superband (didn't feel much) Standing hip abd + RTB below knees 2x10 4" step fwd weight shifting/loading on R LE (painful) Seated half long sit HS & ADD stretches (R)   [facility] Adult PT Treatment:                                                DATE: 12/18/2023 Therapeutic Exercise: NuStep L6 x SAQ (green bolster) + 5#AW 2x10x5" SLR in parallel 10x5" Side Lying (back against wall): Clamshells Straight leg hip abd + YTB above knees Straight leg hip abd in ER Hooklying clamshells + black TB x30 Bridges + hip abd iso with black TB x20 Standing hip abd + blue TB above knees (toe tap position) Hip extension on diagonal (discontinued d/t no glute activation) Runner's lunge stretch 2x30"  Self Care: Video workouts via youtube (cardiac/senior focused)   [facility] Adult PT Treatment:                                                DATE: 12/16/2023 Therapeutic Exercise: NuStep L5 x 5 min Step ups 6 inch step L x 20, 4 inch step L x 10 focus on controlling hyper ext) TKE  into ball 5 sec hold x 10 SAQ (green bolster) + 5#AW 5 sec hold 2x10 SLR in  parallel 2 x10, ER 1x10 Bridges 2x15 S/L clamshells + RTB 2x10 S/L straight leg hip abd + RTB 2x10 - positioned in 45 deg hip flexion to isolate gluteals Prone quad stretch with strap 2x30 sec                                                                                                                             PATIENT EDUCATION:  Education details: rationale for interventions, HEP  Person educated: Patient Education method: Explanation, Demonstration, Tactile cues, Verbal cues Education comprehension: verbalized understanding, returned demonstration, verbal cues required, tactile cues required, and needs further education     HOME EXERCISE PROGRAM: Access Code: [email or web address] URL: [email or web address] Date: 12/11/2023 Prepared by: Sims Duck  Exercises - Supine Quadriceps Stretch with Strap on Table (Mirrored)  - 2 x daily - 7 x weekly - 1 sets - 3 reps - 30-60 sec hold - Supine Active Straight Leg Raise  - 2 x daily - 7 x weekly - 3 sets - 10 reps - Standing Hip Abduction with Counter Support  - 1 x daily - 7 x weekly - 2 sets - 10 reps - Sit to Stand  - 3 x daily - 7 x weekly - 1-2 sets - 10 reps - Clam with Resistance  - 1 x daily - 7 x weekly - 3 sets - 10 reps - Sidelying Hip Abduction with Resistance at Thighs  - 1 x daily - 7 x weekly - 3 sets - 10 reps - Prone Quadriceps Stretch with Strap  - 1 x daily - 7 x weekly - 1 sets - 3-5 reps - 10-30 sec hold  ASSESSMENT:  CLINICAL IMPRESSION: 12/24/2023 Pt arrives w/ increased pain earlier this morning but feeling better this afternoon. Continuing with hip/knee strengthening exercises within pt tolerance. She also brings her exercise bands from home so we work with these today as she wants to make sure she is using them correctly. Pt tolerates activities well, does require frequent cues for appropriate alignment as she tends to demonstrate  increased hip rotation with sagittal plane movements and increased anterior tibial translation. Reports improved pain on departure, no adverse events. Recommend continuing along current POC in order to address relevant deficits and improve functional tolerance. Pt departs today's session in no acute distress, all voiced questions/concerns addressed appropriately from PT perspective.     EVAL: Patient is a 73 y.o. female who was seen today for physical therapy evaluation and treatment for R knee pain. She has had injections in the past, which have helped, but she is no longer eligible due to low platelets. She has pain primarily with walking. She demonstrates B LE weakness, ROM and flexibility deficits and functional weakness with sit to stands and stairs. She will benefit from skilled PT to address these deficits.     OBJECTIVE IMPAIRMENTS: decreased activity tolerance, difficulty walking, decreased ROM, decreased strength, increased edema, impaired flexibility, postural dysfunction, and pain.   ACTIVITY  LIMITATIONS: lifting, squatting, stairs, transfers, bed mobility, and locomotion level  PARTICIPATION LIMITATIONS: community activity  PERSONAL FACTORS: Age, Fitness, Time since onset of injury/illness/exacerbation, and 1 comorbidity: OA  are also affecting patient's functional outcome.   REHAB POTENTIAL: Good  CLINICAL DECISION MAKING: Evolving/moderate complexity  EVALUATION COMPLEXITY: Low   SHORT TERM GOALS: Target date: 12/29/2023    Ind with initial HEP Baseline: Goal status: INITIAL  2.  Decreased knee pain by 25%. Baseline:  Goal status: INITIAL   LONG TERM GOALS: Target date: 02/02/2024   Ind with advanced HEP and its progression Baseline:  Goal status: INITIAL  2. Improved R knee ROM to 0-105 deg to normalize gait and functional mobility Baseline:  Goal status: INITIAL  3.  Improved LE strength to 5/5 to improve function Baseline:  Goal status: INITIAL  4.  Able  to ascend stairs with a reciprocal gait pattern and one UE  Baseline:  Goal status: INITIAL  5.  Decreased knee pain in the by >=75% with ADLs. Baseline:  Goal status: INITIAL  6.  Improved LEFS  to 63 showing functional improvement Baseline: 54 / 80 = 67.5 % Goal status:INITIAL    PLAN:  PT FREQUENCY: 2x/week  PT DURATION: 8 weeks  PLANNED INTERVENTIONS: 97164- PT Re-evaluation, 97110-Therapeutic exercises, 97530- Therapeutic activity, 97112- Neuromuscular re-education, 97535- Self Care, 16109- Manual therapy, [id number]- Gait training, [id number]- Aquatic Therapy, 97014- Electrical stimulation (unattended), 97016- Vasopneumatic device, 97033- Ionotophoresis 4mg /ml Dexamethasone , Patient/Family education, Balance training, Stair training, Taping, Dry Needling, Joint mobilization, Cryotherapy, and Moist heat  PLAN FOR NEXT SESSION: R knee ROM, LE/glute/hip ER strengthening, stairs    Lovett Ruck PT, DPT 12/24/2023 4:09 PM

## 2023-12-25 NOTE — Progress Notes (Signed)
Hi Love, mammogram showed some questionable areas particularly in your left breast.  The imaging department will be contacting you for further workup.  They will need to do some additional imaging.

## 2023-12-26 ENCOUNTER — Other Ambulatory Visit: Payer: Self-pay | Admitting: Family Medicine

## 2023-12-26 DIAGNOSIS — R928 Other abnormal and inconclusive findings on diagnostic imaging of breast: Secondary | ICD-10-CM

## 2023-12-26 NOTE — Progress Notes (Signed)
Hi Margaret Smith, overall bone density looks good.  Recommend repeat in 3 to 5 years.

## 2023-12-29 NOTE — Therapy (Incomplete)
OUTPATIENT PHYSICAL THERAPY LOWER EXTREMITY TREATMENT   Patient Name: Margaret Smith MRN: [id number] DOB:March 31, 1951, 73 y.o., female Today's Date: 12/30/2023  END OF SESSION:  PT End of Session - 12/30/23 1400     Visit Number 7    Date for PT Re-Evaluation 02/02/24    Authorization Type MCR    Progress Note Due on Visit 10    PT Start Time 1400    PT Stop Time 1440    PT Time Calculation (min) 40 min    Activity Tolerance Patient tolerated treatment well              Past Medical History:  Diagnosis Date   Hypertension    Pneumonia    Thrombocytopenia (HCC) 11/15/2020   Past Surgical History:  Procedure Laterality Date   CARPAL TUNNEL RELEASE  1980s   bilateral.    LIPOMA EXCISION     left forearm.    REPLACEMENT TOTAL KNEE Left    Dr. Corinna Capra   Patient Active Problem List   Diagnosis Date Noted   Encounter for screening for osteoporosis 10/28/2023   Osteoporosis 10/28/2023   Mitral regurgitation 01/24/2023   Tricuspid regurgitation 01/24/2023   OSA on CPAP 11/15/2022   Paroxysmal A-fib (HCC) 11/12/2022   Primary hypertension 09/17/2022   Thrombocytopenia (HCC) 11/15/2020   White coat syndrome without diagnosis of hypertension 01/21/2017   Bradycardia 12/28/2015   Thyromegaly 12/28/2015   Primary osteoarthritis of right knee, post left knee arthroplasty 06/26/2015    PCP: Agapito Games, MD   REFERRING PROVIDER: Monica Becton,*   REFERRING DIAG: W29.56 (ICD-10-CM) - Primary osteoarthritis of right knee   THERAPY DIAG:  Chronic pain of right knee  Stiffness of right knee, not elsewhere classified  Muscle weakness (generalized)  Rationale for Evaluation and Treatment: Rehabilitation  ONSET DATE: October 2024 SUBJECTIVE:   SUBJECTIVE STATEMENT: Pt states she has had a busy day with medical appts again. Had some pain earlier this morning but feeling better now. Had some mild soreness after last session but cleared by next day. She  continues to endorse wanting to talk to her cardiologist about some of her questions with Afib, states she's going to call them tomorrow. Doesn't endorse any recent changes or red flags - describes them as non-worsening, feels it has mostly coincided with cold weather   PERTINENT HISTORY: L TKR, HTN, low platelets, R plantar fasciitis, previous A fib (had Watchman implanted) PAIN:  Are you having pain? Yes: NPRS scale: no pain now, 4-5/10 Pain location: R knee Pain description: sharp, hits and goes Aggravating factors: walking Relieving factors: rest  PRECAUTIONS: None  RED FLAGS: None   WEIGHT BEARING RESTRICTIONS: No  FALLS:  Has patient fallen in last 6 months? No  LIVING ENVIRONMENT: Lives with: lives alone Lives in: House/apartment Stairs: Yes: Internal: 13 steps; on left going up and External: 1 steps; none Has following equipment at home: Single point cane, Walker - 2 wheeled, and Wheelchair (manual)  OCCUPATION: retired  PLOF: Independent  PATIENT GOALS: to be able to walk her hour per day  NEXT MD VISIT: none scheduled  OBJECTIVE:  Note: Objective measures were completed at Evaluation unless otherwise noted.  DIAGNOSTIC FINDINGS: Scheduled  PATIENT SURVEYS:  LEFS 54 / 80 = 67.5 %  COGNITION: Overall cognitive status: Within functional limits for tasks assessed     SENSATION: WFL  EDEMA:  Patient reports it swells intermittently  MUSCLE LENGTH: Marked quad tightness and gastroc B  POSTURE: rounded shoulders,  forward head, and flexed trunk  genu valgus B  PALPATION: Lateral right knee  LOWER EXTREMITY ROM:  A/P ROM Right eval Left eval  Knee flexion 99/108 74/80  Knee extension 0 0   (Blank rows = not tested)  LOWER EXTREMITY MMT:  MMT Right eval Left eval  Hip flexion 4- 4-  Hip extension 5 5  Hip abduction 4- 4-  Hip adduction 5 5  Hip internal rotation    Hip external rotation    Knee flexion 4- 5  Knee extension 4+ 5  Ankle  dorsiflexion 5 5   (Blank rows = not tested)   FUNCTIONAL TESTS:  5 times sit to stand: 14.17 sec Stairs - eccentric weakness R, hyperextends right with ascending  GAIT: Distance walked: 20 Assistive device utilized: None Level of assistance: Complete Independence Comments: genu valgus B    [facility] Adult PT Treatment:                                                DATE: 12/30/23 Therapeutic Exercise: STS 2x8 from raised mat cues for BOS  Purple band short lever hip abd/ext, x5 BIL cues for form and posture, breath control  Purple band long lever hip abd/ex 2x5 each BIL  SLR x12 BIL LE, seated, cues for knee ext prior to raise TKE into bosu ball, seated 2x12 cues for quad contraction Purple band hamstring curl, seated RLE only 2x12  4 inch step weight shift 3x10 w/ UE support  8 inch step tap, x10 cues for reduced circumduction compensations    [facility] Adult PT Treatment:                                                DATE: 12/24/23 Therapeutic Exercise: Clamshells 2x8, hooklying (pt purple band) cues for knee positioning  Hooklying march (pt purple band) 2x8 BIL LE Seated TKE into bosu 2x10  BIL LE cues for quad contractions 4 inch step weight shift RLE 2x8 w/ BIL UE support cues for reduced anterior tibial translation Seated hip abduction purple band 2x10 cues for form and posture STS from raised mat (+2 airex) 3x5 cues for BOS    [facility] Adult PT Treatment:                                            DATE: 12/22/2023 Therapeutic Exercise: Side Lying: Clamshells --> feet against wall Straight leg hip abd (back against wall) --> pulses --> small circles CW/CCW Seated hip ER --> YTB Hooklying clamshells + GTB (needed more resistance) Side stepping with orange superband (didn't feel much) Standing hip abd + RTB below knees 2x10 4" step fwd weight shifting/loading on R LE (painful) Seated half long sit HS & ADD stretches (R)   [facility] Adult PT Treatment:                                                 DATE: 12/18/2023 Therapeutic Exercise: NuStep L6 x SAQ (  green bolster) + 5#AW 2x10x5" SLR in parallel 10x5" Side Lying (back against wall): Clamshells Straight leg hip abd + YTB above knees Straight leg hip abd in ER Hooklying clamshells + black TB x30 Bridges + hip abd iso with black TB x20 Standing hip abd + blue TB above knees (toe tap position) Hip extension on diagonal (discontinued d/t no glute activation) Runner's lunge stretch 2x30"  Self Care: Video workouts via youtube (cardiac/senior focused)   [facility] Adult PT Treatment:                                                DATE: 12/16/2023 Therapeutic Exercise: NuStep L5 x 5 min Step ups 6 inch step L x 20, 4 inch step L x 10 focus on controlling hyper ext) TKE into ball 5 sec hold x 10 SAQ (green bolster) + 5#AW 5 sec hold 2x10 SLR in parallel 2 x10, ER 1x10 Bridges 2x15 S/L clamshells + RTB 2x10 S/L straight leg hip abd + RTB 2x10 - positioned in 45 deg hip flexion to isolate gluteals Prone quad stretch with strap 2x30 sec                                                                                                                             PATIENT EDUCATION:  Education details: rationale for interventions, HEP  Person educated: Patient Education method: Explanation, Demonstration, Tactile cues, Verbal cues Education comprehension: verbalized understanding, returned demonstration, verbal cues required, tactile cues required, and needs further education     HOME EXERCISE PROGRAM: Access Code: [email or web address] URL: [email or web address] Date: 12/11/2023 Prepared by: Carlynn Herald  Exercises - Supine Quadriceps Stretch with Strap on Table (Mirrored)  - 2 x daily - 7 x weekly - 1 sets - 3 reps - 30-60 sec hold - Supine Active Straight Leg Raise  - 2 x daily - 7 x weekly - 3 sets - 10 reps - Standing Hip Abduction with Counter Support  - 1 x daily - 7 x weekly - 2 sets - 10 reps - Sit to  Stand  - 3 x daily - 7 x weekly - 1-2 sets - 10 reps - Clam with Resistance  - 1 x daily - 7 x weekly - 3 sets - 10 reps - Sidelying Hip Abduction with Resistance at Thighs  - 1 x daily - 7 x weekly - 3 sets - 10 reps - Prone Quadriceps Stretch with Strap  - 1 x daily - 7 x weekly - 1 sets - 3-5 reps - 10-30 sec hold  ASSESSMENT:  CLINICAL IMPRESSION: 12/30/2023 Pt arrives w/o overt pain, no issues after last session. Today continuing to work on open and closed chain knee strengthening, progressing hip strengthening into standing positions. Pt tolerates well with some exertional fatigue, mild anterior knee pain throughout non worsening.  No adverse events, cues as above. Recommend continuing along current POC in order to address relevant deficits and improve functional tolerance. Pt departs today's session in no acute distress, all voiced questions/concerns addressed appropriately from PT perspective.    EVAL: Patient is a 73 y.o. female who was seen today for physical therapy evaluation and treatment for R knee pain. She has had injections in the past, which have helped, but she is no longer eligible due to low platelets. She has pain primarily with walking. She demonstrates B LE weakness, ROM and flexibility deficits and functional weakness with sit to stands and stairs. She will benefit from skilled PT to address these deficits.     OBJECTIVE IMPAIRMENTS: decreased activity tolerance, difficulty walking, decreased ROM, decreased strength, increased edema, impaired flexibility, postural dysfunction, and pain.   ACTIVITY LIMITATIONS: lifting, squatting, stairs, transfers, bed mobility, and locomotion level  PARTICIPATION LIMITATIONS: community activity  PERSONAL FACTORS: Age, Fitness, Time since onset of injury/illness/exacerbation, and 1 comorbidity: OA  are also affecting patient's functional outcome.   REHAB POTENTIAL: Good  CLINICAL DECISION MAKING: Evolving/moderate complexity  EVALUATION  COMPLEXITY: Low   SHORT TERM GOALS: Target date: 12/29/2023    Ind with initial HEP Baseline: 12/30/23: reports good HEP adherence Goal status: MET  2.  Decreased knee pain by 25%. Baseline:  Goal status: ONGOING   LONG TERM GOALS: Target date: 02/02/2024   Ind with advanced HEP and its progression Baseline:  Goal status: INITIAL  2. Improved R knee ROM to 0-105 deg to normalize gait and functional mobility Baseline:  Goal status: INITIAL  3.  Improved LE strength to 5/5 to improve function Baseline:  Goal status: INITIAL  4.  Able to ascend stairs with a reciprocal gait pattern and one UE  Baseline:  Goal status: INITIAL  5.  Decreased knee pain in the by >=75% with ADLs. Baseline:  Goal status: INITIAL  6.  Improved LEFS  to 63 showing functional improvement Baseline: 54 / 80 = 67.5 % Goal status:INITIAL    PLAN:  PT FREQUENCY: 2x/week  PT DURATION: 8 weeks  PLANNED INTERVENTIONS: 97164- PT Re-evaluation, 97110-Therapeutic exercises, 97530- Therapeutic activity, 97112- Neuromuscular re-education, 97535- Self Care, 82956- Manual therapy, [id number]- Gait training, [phone]- Aquatic Therapy, 97014- Electrical stimulation (unattended), 97016- Vasopneumatic device, [id number]- Ionotophoresis 4mg /ml Dexamethasone, Patient/Family education, Balance training, Stair training, Taping, Dry Needling, Joint mobilization, Cryotherapy, and Moist heat  PLAN FOR NEXT SESSION: R knee ROM, LE/glute/hip ER strengthening, stairs    Ashley Murrain PT, DPT 12/30/2023 2:42 PM

## 2023-12-30 ENCOUNTER — Ambulatory Visit
Admission: RE | Admit: 2023-12-30 | Discharge: 2023-12-30 | Discharge status: Home / Self Care | Payer: Medicare Other | Source: Ambulatory Visit | Attending: Family Medicine | Admitting: Family Medicine

## 2023-12-30 ENCOUNTER — Encounter: Payer: Self-pay | Admitting: Physical Therapy

## 2023-12-30 ENCOUNTER — Ambulatory Visit: Payer: Medicare Other | Admitting: Physical Therapy

## 2023-12-30 DIAGNOSIS — G8929 Other chronic pain: Secondary | ICD-10-CM

## 2023-12-30 DIAGNOSIS — M25661 Stiffness of right knee, not elsewhere classified: Secondary | ICD-10-CM | POA: Diagnosis not present

## 2023-12-30 DIAGNOSIS — M6281 Muscle weakness (generalized): Secondary | ICD-10-CM | POA: Diagnosis not present

## 2023-12-30 DIAGNOSIS — N6012 Diffuse cystic mastopathy of left breast: Secondary | ICD-10-CM | POA: Diagnosis not present

## 2023-12-30 DIAGNOSIS — R928 Other abnormal and inconclusive findings on diagnostic imaging of breast: Secondary | ICD-10-CM | POA: Diagnosis not present

## 2023-12-30 DIAGNOSIS — R92322 Mammographic fibroglandular density, left breast: Secondary | ICD-10-CM | POA: Diagnosis not present

## 2023-12-30 DIAGNOSIS — M25561 Pain in right knee: Secondary | ICD-10-CM | POA: Diagnosis not present

## 2024-01-01 ENCOUNTER — Ambulatory Visit: Payer: Medicare Other

## 2024-01-01 DIAGNOSIS — G8929 Other chronic pain: Secondary | ICD-10-CM

## 2024-01-01 DIAGNOSIS — M6281 Muscle weakness (generalized): Secondary | ICD-10-CM

## 2024-01-01 DIAGNOSIS — M25661 Stiffness of right knee, not elsewhere classified: Secondary | ICD-10-CM

## 2024-01-01 DIAGNOSIS — M25561 Pain in right knee: Secondary | ICD-10-CM | POA: Diagnosis not present

## 2024-01-01 NOTE — Therapy (Signed)
OUTPATIENT PHYSICAL THERAPY LOWER EXTREMITY TREATMENT   Patient Name: Margaret Smith MRN: [id number] DOB:04-Jun-1951, 73 y.o., female Today's Date: 01/01/2024  END OF SESSION:  PT End of Session - 01/01/24 1446     Visit Number 8    Date for PT Re-Evaluation 02/02/24    Progress Note Due on Visit 10    PT Start Time 1448    PT Stop Time 1530    PT Time Calculation (min) 42 min    Activity Tolerance Patient tolerated treatment well    Behavior During Therapy [facility] for tasks assessed/performed            Past Medical History:  Diagnosis Date   Hypertension    Pneumonia    Thrombocytopenia (HCC) 11/15/2020   Past Surgical History:  Procedure Laterality Date   CARPAL TUNNEL RELEASE  1980s   bilateral.    LIPOMA EXCISION     left forearm.    REPLACEMENT TOTAL KNEE Left    Dr. Corinna Capra   Patient Active Problem List   Diagnosis Date Noted   Encounter for screening for osteoporosis 10/28/2023   Osteoporosis 10/28/2023   Mitral regurgitation 01/24/2023   Tricuspid regurgitation 01/24/2023   OSA on CPAP 11/15/2022   Paroxysmal A-fib (HCC) 11/12/2022   Primary hypertension 09/17/2022   Thrombocytopenia (HCC) 11/15/2020   White coat syndrome without diagnosis of hypertension 01/21/2017   Bradycardia 12/28/2015   Thyromegaly 12/28/2015   Primary osteoarthritis of right knee, post left knee arthroplasty 06/26/2015    PCP: Agapito Games, MD   REFERRING PROVIDER: Monica Becton,*   REFERRING DIAG: J47.82 (ICD-10-CM) - Primary osteoarthritis of right knee   THERAPY DIAG:  Chronic pain of right knee  Stiffness of right knee, not elsewhere classified  Muscle weakness (generalized)  Rationale for Evaluation and Treatment: Rehabilitation  ONSET DATE: October 2024 SUBJECTIVE:   SUBJECTIVE STATEMENT: Patient reports with cardiologist on Monday 01/05/24 and will follow up on symptoms she has been having. Patient states R knee was hurting this morning but  pain has since eased off and is feeling pretty good now.    PERTINENT HISTORY: L TKR, HTN, low platelets, R plantar fasciitis, previous A fib (had Watchman implanted) PAIN:  Are you having pain? Yes: NPRS scale: no pain now, 4-5/10 Pain location: R knee Pain description: sharp, hits and goes Aggravating factors: walking Relieving factors: rest  PRECAUTIONS: None  RED FLAGS: None   WEIGHT BEARING RESTRICTIONS: No  FALLS:  Has patient fallen in last 6 months? No  LIVING ENVIRONMENT: Lives with: lives alone Lives in: House/apartment Stairs: Yes: Internal: 13 steps; on left going up and External: 1 steps; none Has following equipment at home: Single point cane, Walker - 2 wheeled, and Wheelchair (manual)  OCCUPATION: retired  PLOF: Independent  PATIENT GOALS: to be able to walk her hour per day  NEXT MD VISIT: none scheduled  OBJECTIVE:  Note: Objective measures were completed at Evaluation unless otherwise noted.  DIAGNOSTIC FINDINGS: Scheduled  PATIENT SURVEYS:  LEFS 54 / 80 = 67.5 %  COGNITION: Overall cognitive status: Within functional limits for tasks assessed     SENSATION: WFL  EDEMA:  Patient reports it swells intermittently  MUSCLE LENGTH: Marked quad tightness and gastroc B  POSTURE: rounded shoulders, forward head, and flexed trunk  genu valgus B  PALPATION: Lateral right knee  LOWER EXTREMITY ROM:  A/P ROM Right eval Left eval  Knee flexion 99/108 74/80  Knee extension 0 0   (Blank rows =  not tested)  LOWER EXTREMITY MMT:  MMT Right eval Left eval  Hip flexion 4- 4-  Hip extension 5 5  Hip abduction 4- 4-  Hip adduction 5 5  Hip internal rotation    Hip external rotation    Knee flexion 4- 5  Knee extension 4+ 5  Ankle dorsiflexion 5 5   (Blank rows = not tested)   FUNCTIONAL TESTS:  5 times sit to stand: 14.17 sec Stairs - eccentric weakness R, hyperextends right with ascending  GAIT: Distance walked: 20 Assistive  device utilized: None Level of assistance: Complete Independence Comments: genu valgus B   [facility] Adult PT Treatment:                                                DATE: 01/01/2024 Therapeutic Exercise: NuStep L6-4 x 5 min (LE only) Prone knee extension (quad set) 2x10 (R) Hooklying hip abd (clamshells) + pt purple band 2x15 Bent knee fall out + pt purple band x10 each leg SLR + deflated coregeous ball behind knee (small range lift) x12 (B) --> (R) SLR small range + 1#AW x15 Seated TKE into bosu 2x10  BIL LE cues for quad contractions Resisted side stepping + pt blue band above knees   [facility] Adult PT Treatment:                                                DATE: 12/30/23 Therapeutic Exercise: STS 2x8 from raised mat cues for BOS  Purple band short lever hip abd/ext, x5 BIL cues for form and posture, breath control  Purple band long lever hip abd/ex 2x5 each BIL  SLR x12 BIL LE, seated, cues for knee ext prior to raise TKE into bosu ball, seated 2x12 cues for quad contraction Purple band hamstring curl, seated RLE only 2x12  4 inch step weight shift 3x10 w/ UE support  8 inch step tap, x10 cues for reduced circumduction compensations    [facility] Adult PT Treatment:                                                DATE: 12/24/23 Therapeutic Exercise: Clamshells 2x8, hooklying (pt purple band) cues for knee positioning  Hooklying march (pt purple band) 2x8 BIL LE Seated TKE into bosu 2x10  BIL LE cues for quad contractions 4 inch step weight shift RLE 2x8 w/ BIL UE support cues for reduced anterior tibial translation Seated hip abduction purple band 2x10 cues for form and posture STS from raised mat (+2 airex) 3x5 cues for BOS  PATIENT EDUCATION:  Education details: rationale for interventions, HEP  Person educated: Patient Education method: Explanation,  Demonstration, Tactile cues, Verbal cues Education comprehension: verbalized understanding, returned demonstration, verbal cues required, tactile cues required, and needs further education     HOME EXERCISE PROGRAM: Access Code: [email or web address] URL: [email or web address] Date: 12/11/2023 Prepared by: Carlynn Herald  Exercises - Supine Quadriceps Stretch with Strap on Table (Mirrored)  - 2 x daily - 7 x weekly - 1 sets - 3 reps - 30-60 sec hold - Supine Active Straight Leg Raise  - 2 x daily - 7 x weekly - 3 sets - 10 reps - Standing Hip Abduction with Counter Support  - 1 x daily - 7 x weekly - 2 sets - 10 reps - Sit to Stand  - 3 x daily - 7 x weekly - 1-2 sets - 10 reps - Clam with Resistance  - 1 x daily - 7 x weekly - 3 sets - 10 reps - Sidelying Hip Abduction with Resistance at Thighs  - 1 x daily - 7 x weekly - 3 sets - 10 reps - Prone Quadriceps Stretch with Strap  - 1 x daily - 7 x weekly - 1 sets - 3-5 reps - 10-30 sec hold  ASSESSMENT:  CLINICAL IMPRESSION: Adding light ankle weight increased quad activation on R LE during straight leg raises. Hip strengthening continued with resisted side stepping; cueing slight knee flexion increased quad activation. Patient will continue to benefit from skilled therapy to address knee pain and strength deficits.  EVAL: Patient is a 73 y.o. female who was seen today for physical therapy evaluation and treatment for R knee pain. She has had injections in the past, which have helped, but she is no longer eligible due to low platelets. She has pain primarily with walking. She demonstrates B LE weakness, ROM and flexibility deficits and functional weakness with sit to stands and stairs. She will benefit from skilled PT to address these deficits.     OBJECTIVE IMPAIRMENTS: decreased activity tolerance, difficulty walking, decreased ROM, decreased strength, increased edema, impaired flexibility, postural dysfunction, and pain.   ACTIVITY  LIMITATIONS: lifting, squatting, stairs, transfers, bed mobility, and locomotion level  PARTICIPATION LIMITATIONS: community activity  PERSONAL FACTORS: Age, Fitness, Time since onset of injury/illness/exacerbation, and 1 comorbidity: OA  are also affecting patient's functional outcome.   REHAB POTENTIAL: Good  CLINICAL DECISION MAKING: Evolving/moderate complexity  EVALUATION COMPLEXITY: Low   SHORT TERM GOALS: Target date: 12/29/2023    Ind with initial HEP Baseline: 12/30/23: reports good HEP adherence Goal status: MET  2.  Decreased knee pain by 25%. Baseline:  Goal status: ONGOING   LONG TERM GOALS: Target date: 02/02/2024   Ind with advanced HEP and its progression Baseline:  Goal status: INITIAL  2. Improved R knee ROM to 0-105 deg to normalize gait and functional mobility Baseline:  Goal status: INITIAL  3.  Improved LE strength to 5/5 to improve function Baseline:  Goal status: INITIAL  4.  Able to ascend stairs with a reciprocal gait pattern and one UE  Baseline:  Goal status: INITIAL  5.  Decreased knee pain in the by >=75% with ADLs. Baseline:  Goal status: INITIAL  6.  Improved LEFS  to 63 showing functional improvement Baseline: 54 / 80 = 67.5 % Goal status:INITIAL    PLAN:  PT FREQUENCY: 2x/week  PT DURATION: 8 weeks  PLANNED INTERVENTIONS: 97164- PT Re-evaluation, 97110-Therapeutic exercises, 97530- Therapeutic activity, [id number]- Neuromuscular re-education, 97535- Self Care,  [id number]- Manual therapy, [id number]- Gait training, 34742- Aquatic Therapy, [phone]- Electrical stimulation (unattended), 97016- Vasopneumatic device, [id number]- Ionotophoresis 4mg /ml Dexamethasone, Patient/Family education, Balance training, Stair training, Taping, Dry Needling, Joint mobilization, Cryotherapy, and Moist heat  PLAN FOR NEXT SESSION: R knee ROM, LE/glute/hip ER strengthening, stairs    Carlynn Herald, PTA 01/01/2024 3:32 PM

## 2024-01-05 DIAGNOSIS — R079 Chest pain, unspecified: Secondary | ICD-10-CM | POA: Diagnosis not present

## 2024-01-05 DIAGNOSIS — R0789 Other chest pain: Secondary | ICD-10-CM | POA: Diagnosis not present

## 2024-01-07 ENCOUNTER — Ambulatory Visit: Payer: Medicare Other

## 2024-01-07 DIAGNOSIS — G8929 Other chronic pain: Secondary | ICD-10-CM | POA: Diagnosis not present

## 2024-01-07 DIAGNOSIS — M6281 Muscle weakness (generalized): Secondary | ICD-10-CM | POA: Diagnosis not present

## 2024-01-07 DIAGNOSIS — M25661 Stiffness of right knee, not elsewhere classified: Secondary | ICD-10-CM | POA: Diagnosis not present

## 2024-01-07 DIAGNOSIS — M25561 Pain in right knee: Secondary | ICD-10-CM | POA: Diagnosis not present

## 2024-01-07 NOTE — Therapy (Signed)
OUTPATIENT PHYSICAL THERAPY LOWER EXTREMITY TREATMENT   Patient Name: Margaret Smith MRN: [id number] DOB:03/28/1951, 73 y.o., female Today's Date: 01/07/2024  END OF SESSION:  PT End of Session - 01/07/24 1314     Visit Number 9    Date for PT Re-Evaluation 02/02/24    Authorization Type MCR    Progress Note Due on Visit 10    PT Start Time 1315    PT Stop Time 1357    PT Time Calculation (min) 42 min    Activity Tolerance Patient tolerated treatment well    Behavior During Therapy [facility] for tasks assessed/performed            Past Medical History:  Diagnosis Date   Hypertension    Pneumonia    Thrombocytopenia (HCC) 11/15/2020   Past Surgical History:  Procedure Laterality Date   CARPAL TUNNEL RELEASE  1980s   bilateral.    LIPOMA EXCISION     left forearm.    REPLACEMENT TOTAL KNEE Left    Dr. Corinna Capra   Patient Active Problem List   Diagnosis Date Noted   Encounter for screening for osteoporosis 10/28/2023   Osteoporosis 10/28/2023   Mitral regurgitation 01/24/2023   Tricuspid regurgitation 01/24/2023   OSA on CPAP 11/15/2022   Paroxysmal A-fib (HCC) 11/12/2022   Primary hypertension 09/17/2022   Thrombocytopenia (HCC) 11/15/2020   White coat syndrome without diagnosis of hypertension 01/21/2017   Bradycardia 12/28/2015   Thyromegaly 12/28/2015   Primary osteoarthritis of right knee, post left knee arthroplasty 06/26/2015    PCP: Agapito Games, MD   REFERRING PROVIDER: Monica Becton,*   REFERRING DIAG: W09.81 (ICD-10-CM) - Primary osteoarthritis of right knee   THERAPY DIAG:  Chronic pain of right knee  Stiffness of right knee, not elsewhere classified  Muscle weakness (generalized)  Rationale for Evaluation and Treatment: Rehabilitation  ONSET DATE: October 2024 SUBJECTIVE:   SUBJECTIVE STATEMENT: Patient reports her R ankle is hurting, states she went for an hour long walk on Saturday and her ankle has been hurting since  then. Patient states she has been icing and resting her leg/ankle, states she get "sharp pain when she flexes ankle and "behind" her ankle when she is walking.    PERTINENT HISTORY: L TKR, HTN, low platelets, R plantar fasciitis, previous A fib (had Watchman implanted) PAIN:  Are you having pain? Yes: NPRS scale: no pain now, 4-5/10 Pain location: R knee Pain description: sharp, hits and goes Aggravating factors: walking Relieving factors: rest  PRECAUTIONS: None  RED FLAGS: None   WEIGHT BEARING RESTRICTIONS: No  FALLS:  Has patient fallen in last 6 months? No  LIVING ENVIRONMENT: Lives with: lives alone Lives in: House/apartment Stairs: Yes: Internal: 13 steps; on left going up and External: 1 steps; none Has following equipment at home: Single point cane, Walker - 2 wheeled, and Wheelchair (manual)  OCCUPATION: retired  PLOF: Independent  PATIENT GOALS: to be able to walk her hour per day  NEXT MD VISIT: none scheduled  OBJECTIVE:  Note: Objective measures were completed at Evaluation unless otherwise noted.  DIAGNOSTIC FINDINGS: Scheduled  PATIENT SURVEYS:  LEFS 54 / 80 = 67.5 %  COGNITION: Overall cognitive status: Within functional limits for tasks assessed     SENSATION: WFL  EDEMA:  Patient reports it swells intermittently  MUSCLE LENGTH: Marked quad tightness and gastroc B  POSTURE: rounded shoulders, forward head, and flexed trunk  genu valgus B  PALPATION: Lateral right knee  LOWER EXTREMITY ROM:  A/P ROM Right eval Left eval  Knee flexion 99/108 74/80  Knee extension 0 0   (Blank rows = not tested)  LOWER EXTREMITY MMT:  MMT Right eval Left eval  Hip flexion 4- 4-  Hip extension 5 5  Hip abduction 4- 4-  Hip adduction 5 5  Hip internal rotation    Hip external rotation    Knee flexion 4- 5  Knee extension 4+ 5  Ankle dorsiflexion 5 5   (Blank rows = not tested)   FUNCTIONAL TESTS:  5 times sit to stand: 14.17  sec Stairs - eccentric weakness R, hyperextends right with ascending  GAIT: Distance walked: 20 Assistive device utilized: None Level of assistance: Complete Independence Comments: genu valgus B   [facility] Adult PT Treatment:                                                DATE: 01/07/2024 Therapeutic Exercise: Hooklying bent knee fall out + bue TB x15 (B) S/L (R) straight leg hip abd --> small circles CW/CCW in abd (R) SLR in ER 3x10 (R) single leg circles CW/CCW 3x5 each Seated knee extension isometric press with orange PB Seated knee flexion isometric press with orange PB Squats with heels elevated Small range sumo squats Standing resisted hip abd + GTB around ankles 3x10 (B)   [facility] Adult PT Treatment:                                                DATE: 01/01/2024 Therapeutic Exercise: NuStep L6-4 x 5 min (LE only) Prone knee extension (quad set) 2x10 (R) Hooklying hip abd (clamshells) + pt purple band 2x15 Bent knee fall out + pt purple band x10 each leg SLR + deflated coregeous ball behind knee (small range lift) x12 (B) --> (R) SLR small range + 1#AW x15 Seated TKE into bosu 2x10  BIL LE cues for quad contractions Resisted side stepping + pt blue band above knees   [facility] Adult PT Treatment:                                                DATE: 12/30/23 Therapeutic Exercise: STS 2x8 from raised mat cues for BOS  Purple band short lever hip abd/ext, x5 BIL cues for form and posture, breath control  Purple band long lever hip abd/ex 2x5 each BIL  SLR x12 BIL LE, seated, cues for knee ext prior to raise TKE into bosu ball, seated 2x12 cues for quad contraction Purple band hamstring curl, seated RLE only 2x12  4 inch step weight shift 3x10 w/ UE support  8 inch step tap, x10 cues for reduced circumduction compensations    [facility] Adult PT Treatment:                                                DATE: 12/24/23 Therapeutic Exercise: Clamshells 2x8, hooklying (pt purple band) cues  for knee positioning  Hooklying march (pt  purple band) 2x8 BIL LE Seated TKE into bosu 2x10  BIL LE cues for quad contractions 4 inch step weight shift RLE 2x8 w/ BIL UE support cues for reduced anterior tibial translation Seated hip abduction purple band 2x10 cues for form and posture STS from raised mat (+2 airex) 3x5 cues for BOS                                                                                                                              PATIENT EDUCATION:  Education details: rationale for interventions, HEP  Person educated: Patient Education method: Explanation, Demonstration, Tactile cues, Verbal cues Education comprehension: verbalized understanding, returned demonstration, verbal cues required, tactile cues required, and needs further education     HOME EXERCISE PROGRAM: Access Code: [email or web address] URL: [email or web address] Date: 12/11/2023 Prepared by: Carlynn Herald  Exercises - Supine Quadriceps Stretch with Strap on Table (Mirrored)  - 2 x daily - 7 x weekly - 1 sets - 3 reps - 30-60 sec hold - Supine Active Straight Leg Raise  - 2 x daily - 7 x weekly - 3 sets - 10 reps - Standing Hip Abduction with Counter Support  - 1 x daily - 7 x weekly - 2 sets - 10 reps - Sit to Stand  - 3 x daily - 7 x weekly - 1-2 sets - 10 reps - Clam with Resistance  - 1 x daily - 7 x weekly - 3 sets - 10 reps - Sidelying Hip Abduction with Resistance at Thighs  - 1 x daily - 7 x weekly - 3 sets - 10 reps - Prone Quadriceps Stretch with Strap  - 1 x daily - 7 x weekly - 1 sets - 3-5 reps - 10-30 sec hold  ASSESSMENT:  CLINICAL IMPRESSION: Standing exercises limited due to increased R ankle pain in standing weight bearing. Squats with elevated heels initially decreased ankle pain, however over time discomfort returned. Hip ER and abd exercises continued to progress LE strengthening and improve tolerance with prolonged physical activities, such as walking.  EVAL: Patient  is a 73 y.o. female who was seen today for physical therapy evaluation and treatment for R knee pain. She has had injections in the past, which have helped, but she is no longer eligible due to low platelets. She has pain primarily with walking. She demonstrates B LE weakness, ROM and flexibility deficits and functional weakness with sit to stands and stairs. She will benefit from skilled PT to address these deficits.     OBJECTIVE IMPAIRMENTS: decreased activity tolerance, difficulty walking, decreased ROM, decreased strength, increased edema, impaired flexibility, postural dysfunction, and pain.   ACTIVITY LIMITATIONS: lifting, squatting, stairs, transfers, bed mobility, and locomotion level  PARTICIPATION LIMITATIONS: community activity  PERSONAL FACTORS: Age, Fitness, Time since onset of injury/illness/exacerbation, and 1 comorbidity: OA  are also affecting patient's functional outcome.   REHAB POTENTIAL: Good  CLINICAL DECISION MAKING: Evolving/moderate complexity  EVALUATION COMPLEXITY: Low  SHORT TERM GOALS: Target date: 12/29/2023    Ind with initial HEP Baseline: 12/30/23: reports good HEP adherence Goal status: MET  2.  Decreased knee pain by 25%. Baseline:  Goal status: ONGOING   LONG TERM GOALS: Target date: 02/02/2024   Ind with advanced HEP and its progression Baseline:  Goal status: INITIAL  2. Improved R knee ROM to 0-105 deg to normalize gait and functional mobility Baseline:  Goal status: INITIAL  3.  Improved LE strength to 5/5 to improve function Baseline:  Goal status: INITIAL  4.  Able to ascend stairs with a reciprocal gait pattern and one UE  Baseline:  Goal status: INITIAL  5.  Decreased knee pain in the by >=75% with ADLs. Baseline:  Goal status: INITIAL  6.  Improved LEFS  to 63 showing functional improvement Baseline: 54 / 80 = 67.5 % Goal status:INITIAL    PLAN:  PT FREQUENCY: 2x/week  PT DURATION: 8 weeks  PLANNED  INTERVENTIONS: 97164- PT Re-evaluation, 97110-Therapeutic exercises, 97530- Therapeutic activity, 97112- Neuromuscular re-education, 97535- Self Care, 16109- Manual therapy, [id number]- Gait training, [phone]- Aquatic Therapy, 97014- Electrical stimulation (unattended), 97016- Vasopneumatic device, 97033- Ionotophoresis 4mg /ml Dexamethasone, Patient/Family education, Balance training, Stair training, Taping, Dry Needling, Joint mobilization, Cryotherapy, and Moist heat  PLAN FOR NEXT SESSION: R knee ROM, LE/glute/hip ER strengthening, stairs    Carlynn Herald, PTA 01/07/2024 1:58 PM

## 2024-01-08 ENCOUNTER — Ambulatory Visit: Payer: Medicare Other | Admitting: Rehabilitative and Restorative Service Providers"

## 2024-01-08 NOTE — Therapy (Deleted)
 OUTPATIENT PHYSICAL THERAPY LOWER EXTREMITY TREATMENT and 10TH VISIT PROGRESS NOTE   Patient Name: Margaret Smith MRN: [id number] DOB:24-Apr-1951, 73 y.o., female Today's Date: 01/08/2024   Progress Note Reporting Period *** to ***  See note below for Objective Data and Assessment of Progress/Goals.     END OF SESSION:   Past Medical History:  Diagnosis Date   Hypertension    Pneumonia    Thrombocytopenia (HCC) 11/15/2020   Past Surgical History:  Procedure Laterality Date   CARPAL TUNNEL RELEASE  1980s   bilateral.    LIPOMA EXCISION     left forearm.    REPLACEMENT TOTAL KNEE Left    Dr. Corinna Capra   Patient Active Problem List   Diagnosis Date Noted   Encounter for screening for osteoporosis 10/28/2023   Osteoporosis 10/28/2023   Mitral regurgitation 01/24/2023   Tricuspid regurgitation 01/24/2023   OSA on CPAP 11/15/2022   Paroxysmal A-fib (HCC) 11/12/2022   Primary hypertension 09/17/2022   Thrombocytopenia (HCC) 11/15/2020   White coat syndrome without diagnosis of hypertension 01/21/2017   Bradycardia 12/28/2015   Thyromegaly 12/28/2015   Primary osteoarthritis of right knee, post left knee arthroplasty 06/26/2015    PCP: Agapito Games, MD REFERRING PROVIDER: Agapito Games, *   REFERRING DIAG: M17.11 (ICD-10-CM) - Primary osteoarthritis of right knee   THERAPY DIAG:  No diagnosis found.  Rationale for Evaluation and Treatment: Rehabilitation  ONSET DATE: October 2024 SUBJECTIVE:   SUBJECTIVE STATEMENT: Patient reports her R ankle is hurting, states she went for an hour long walk on Saturday and her ankle has been hurting since then. Patient states she has been icing and resting her leg/ankle, states she get "sharp pain when she flexes ankle and "behind" her ankle when she is walking.    PERTINENT HISTORY: L TKR, HTN, low platelets, R plantar fasciitis, previous A fib (had Watchman implanted) PAIN:  Are you having pain? Yes: NPRS  scale: no pain now, 4-5/10 Pain location: R knee Pain description: sharp, hits and goes Aggravating factors: walking Relieving factors: rest  PRECAUTIONS: None  RED FLAGS: None   WEIGHT BEARING RESTRICTIONS: No  FALLS:  Has patient fallen in last 6 months? No  LIVING ENVIRONMENT: Lives with: lives alone Lives in: House/apartment Stairs: Yes: Internal: 13 steps; on left going up and External: 1 steps; none Has following equipment at home: Single point cane, Walker - 2 wheeled, and Wheelchair (manual)  OCCUPATION: retired  PLOF: Independent  PATIENT GOALS: to be able to walk her hour per day  NEXT MD VISIT: none scheduled  OBJECTIVE:  Note: Objective measures were completed at Evaluation unless otherwise noted.  DIAGNOSTIC FINDINGS: Scheduled  PATIENT SURVEYS:  LEFS 54 / 80 = 67.5 %  COGNITION: Overall cognitive status: Within functional limits for tasks assessed     SENSATION: WFL  EDEMA:  Patient reports it swells intermittently  MUSCLE LENGTH: Marked quad tightness and gastroc B  POSTURE: rounded shoulders, forward head, and flexed trunk  genu valgus B  PALPATION: Lateral right knee  LOWER EXTREMITY ROM:  A/P ROM Right eval Left eval  Knee flexion 99/108 74/80  Knee extension 0 0   (Blank rows = not tested)  LOWER EXTREMITY MMT:  MMT Right eval Left eval  Hip flexion 4- 4-  Hip extension 5 5  Hip abduction 4- 4-  Hip adduction 5 5  Hip internal rotation    Hip external rotation    Knee flexion 4- 5  Knee extension 4+ 5  Ankle dorsiflexion 5 5   (Blank rows = not tested)   FUNCTIONAL TESTS:  5 times sit to stand: 14.17 sec Stairs - eccentric weakness R, hyperextends right with ascending  GAIT: Distance walked: 20 Assistive device utilized: None Level of assistance: Complete Independence Comments: genu valgus B   [facility] Adult PT Treatment:                                                DATE: 01/07/2024 Therapeutic  Exercise: Hooklying bent knee fall out + bue TB x15 (B) S/L (R) straight leg hip abd --> small circles CW/CCW in abd (R) SLR in ER 3x10 (R) single leg circles CW/CCW 3x5 each Seated knee extension isometric press with orange PB Seated knee flexion isometric press with orange PB Squats with heels elevated Small range sumo squats Standing resisted hip abd + GTB around ankles 3x10 (B)   [facility] Adult PT Treatment:                                                DATE: 01/01/2024 Therapeutic Exercise: NuStep L6-4 x 5 min (LE only) Prone knee extension (quad set) 2x10 (R) Hooklying hip abd (clamshells) + pt purple band 2x15 Bent knee fall out + pt purple band x10 each leg SLR + deflated coregeous ball behind knee (small range lift) x12 (B) --> (R) SLR small range + 1#AW x15 Seated TKE into bosu 2x10  BIL LE cues for quad contractions Resisted side stepping + pt blue band above knees   [facility] Adult PT Treatment:                                                DATE: 12/30/23 Therapeutic Exercise: STS 2x8 from raised mat cues for BOS  Purple band short lever hip abd/ext, x5 BIL cues for form and posture, breath control  Purple band long lever hip abd/ex 2x5 each BIL  SLR x12 BIL LE, seated, cues for knee ext prior to raise TKE into bosu ball, seated 2x12 cues for quad contraction Purple band hamstring curl, seated RLE only 2x12  4 inch step weight shift 3x10 w/ UE support  8 inch step tap, x10 cues for reduced circumduction compensations    [facility] Adult PT Treatment:                                                DATE: 12/24/23 Therapeutic Exercise: Clamshells 2x8, hooklying (pt purple band) cues for knee positioning  Hooklying march (pt purple band) 2x8 BIL LE Seated TKE into bosu 2x10  BIL LE cues for quad contractions 4 inch step weight shift RLE 2x8 w/ BIL UE support cues for reduced anterior tibial translation Seated hip abduction purple band 2x10 cues for form and posture STS from raised  mat (+2 airex) 3x5 cues for BOS  PATIENT EDUCATION:  Education details: rationale for interventions, HEP  Person educated: Patient Education method: Explanation, Demonstration, Tactile cues, Verbal cues Education comprehension: verbalized understanding, returned demonstration, verbal cues required, tactile cues required, and needs further education     HOME EXERCISE PROGRAM: Access Code: [email or web address] URL: [email or web address] Date: 12/11/2023 Prepared by: Carlynn Herald  Exercises - Supine Quadriceps Stretch with Strap on Table (Mirrored)  - 2 x daily - 7 x weekly - 1 sets - 3 reps - 30-60 sec hold - Supine Active Straight Leg Raise  - 2 x daily - 7 x weekly - 3 sets - 10 reps - Standing Hip Abduction with Counter Support  - 1 x daily - 7 x weekly - 2 sets - 10 reps - Sit to Stand  - 3 x daily - 7 x weekly - 1-2 sets - 10 reps - Clam with Resistance  - 1 x daily - 7 x weekly - 3 sets - 10 reps - Sidelying Hip Abduction with Resistance at Thighs  - 1 x daily - 7 x weekly - 3 sets - 10 reps - Prone Quadriceps Stretch with Strap  - 1 x daily - 7 x weekly - 1 sets - 3-5 reps - 10-30 sec hold  ASSESSMENT:  CLINICAL IMPRESSION: Standing exercises limited due to increased R ankle pain in standing weight bearing. Squats with elevated heels initially decreased ankle pain, however over time discomfort returned. Hip ER and abd exercises continued to progress LE strengthening and improve tolerance with prolonged physical activities, such as walking.  EVAL: Patient is a 73 y.o. female who was seen today for physical therapy evaluation and treatment for R knee pain. She has had injections in the past, which have helped, but she is no longer eligible due to low platelets. She has pain primarily with walking. She demonstrates B LE weakness, ROM and flexibility deficits  and functional weakness with sit to stands and stairs. She will benefit from skilled PT to address these deficits.     OBJECTIVE IMPAIRMENTS: decreased activity tolerance, difficulty walking, decreased ROM, decreased strength, increased edema, impaired flexibility, postural dysfunction, and pain.   ACTIVITY LIMITATIONS: lifting, squatting, stairs, transfers, bed mobility, and locomotion level  PARTICIPATION LIMITATIONS: community activity  PERSONAL FACTORS: Age, Fitness, Time since onset of injury/illness/exacerbation, and 1 comorbidity: OA  are also affecting patient's functional outcome.   REHAB POTENTIAL: Good  CLINICAL DECISION MAKING: Evolving/moderate complexity  EVALUATION COMPLEXITY: Low   SHORT TERM GOALS: Target date: 12/29/2023    Ind with initial HEP Baseline: 12/30/23: reports good HEP adherence Goal status: MET  2.  Decreased knee pain by 25%. Baseline:  Goal status: ONGOING   LONG TERM GOALS: Target date: 02/02/2024   Ind with advanced HEP and its progression Baseline:  Goal status: INITIAL  2. Improved R knee ROM to 0-105 deg to normalize gait and functional mobility Baseline:  Goal status: INITIAL  3.  Improved LE strength to 5/5 to improve function Baseline:  Goal status: INITIAL  4.  Able to ascend stairs with a reciprocal gait pattern and one UE  Baseline:  Goal status: INITIAL  5.  Decreased knee pain in the by >=75% with ADLs. Baseline:  Goal status: INITIAL  6.  Improved LEFS  to 63 showing functional improvement Baseline: 54 / 80 = 67.5 % Goal status:INITIAL    PLAN:  PT FREQUENCY: 2x/week  PT DURATION: 8 weeks  PLANNED INTERVENTIONS: 97164- PT Re-evaluation, 97110-Therapeutic exercises, 97530- Therapeutic activity, [id number]- Neuromuscular re-education, 97535-  Self Care, [id number]- Manual therapy, [id number]- Gait training, [phone]- Aquatic Therapy, [phone]- Electrical stimulation (unattended), 97016- Vasopneumatic device, [phone]- Ionotophoresis  4mg /ml Dexamethasone, Patient/Family education, Balance training, Stair training, Taping, Dry Needling, Joint mobilization, Cryotherapy, and Moist heat  PLAN FOR NEXT SESSION: R knee ROM, LE/glute/hip ER strengthening, stairs    Carlynn Herald, PTA 01/08/2024 8:41 AM

## 2024-01-12 ENCOUNTER — Other Ambulatory Visit: Payer: Self-pay | Admitting: Family Medicine

## 2024-01-12 DIAGNOSIS — K219 Gastro-esophageal reflux disease without esophagitis: Secondary | ICD-10-CM

## 2024-01-13 ENCOUNTER — Ambulatory Visit: Payer: Medicare Other | Attending: Sports Medicine | Admitting: Physical Therapy

## 2024-01-13 ENCOUNTER — Encounter: Payer: Self-pay | Admitting: Physical Therapy

## 2024-01-13 DIAGNOSIS — G8929 Other chronic pain: Secondary | ICD-10-CM | POA: Insufficient documentation

## 2024-01-13 DIAGNOSIS — M25561 Pain in right knee: Secondary | ICD-10-CM | POA: Diagnosis not present

## 2024-01-13 DIAGNOSIS — M6281 Muscle weakness (generalized): Secondary | ICD-10-CM | POA: Diagnosis present

## 2024-01-13 DIAGNOSIS — M25661 Stiffness of right knee, not elsewhere classified: Secondary | ICD-10-CM | POA: Insufficient documentation

## 2024-01-13 NOTE — Therapy (Signed)
 OUTPATIENT PHYSICAL THERAPY LOWER EXTREMITY TREATMENT + PROGRESS NOTE   Patient Name: Margaret Smith MRN: [id number] DOB:Apr 20, 1951, 73 y.o., female Today's Date: 01/13/2024   Progress Note Reporting Period 12/08/23 to 01/13/24  See note below for Objective Data and Assessment of Progress/Goals.      END OF SESSION:  PT End of Session - 01/13/24 1401     Visit Number 10    Date for PT Re-Evaluation 02/02/24    Authorization Type MCR    Progress Note Due on Visit 20    PT Start Time 1402    PT Stop Time 1444    PT Time Calculation (min) 42 min    Activity Tolerance Patient tolerated treatment well             Past Medical History:  Diagnosis Date   Hypertension    Pneumonia    Thrombocytopenia (HCC) 11/15/2020   Past Surgical History:  Procedure Laterality Date   CARPAL TUNNEL RELEASE  1980s   bilateral.    LIPOMA EXCISION     left forearm.    REPLACEMENT TOTAL KNEE Left    Dr. Linford   Patient Active Problem List   Diagnosis Date Noted   Encounter for screening for osteoporosis 10/28/2023   Osteoporosis 10/28/2023   Mitral regurgitation 01/24/2023   Tricuspid regurgitation 01/24/2023   OSA on CPAP 11/15/2022   Paroxysmal A-fib (HCC) 11/12/2022   Primary hypertension 09/17/2022   Thrombocytopenia (HCC) 11/15/2020   White coat syndrome without diagnosis of hypertension 01/21/2017   Bradycardia 12/28/2015   Thyromegaly 12/28/2015   Primary osteoarthritis of right knee, post left knee arthroplasty 06/26/2015    PCP: Alvan Dorothyann BIRCH, MD   REFERRING PROVIDER: Curtis Debby PARAS,*   REFERRING DIAG: F82.88 (ICD-10-CM) - Primary osteoarthritis of right knee   THERAPY DIAG:  Chronic pain of right knee  Stiffness of right knee, not elsewhere classified  Muscle weakness (generalized)  Rationale for Evaluation and Treatment: Rehabilitation  ONSET DATE: October 2024 SUBJECTIVE:   SUBJECTIVE STATEMENT: 01/13/2024 Pt states R ankle pain is back  to baseline today. Has stress test tomorrow. Pt states since starting PT, knee feels about 70% better but hasn't been walking as much due to weather and her cardiology work ups. Not really limited in daily activities or basic mobility at this point.     PERTINENT HISTORY: L TKR, HTN, low platelets, R plantar fasciitis, previous A fib (had Watchman implanted) PAIN:  Are you having pain? Yes: NPRS scale: no pain now, 4-5/10 at worst Pain location: R knee Pain description: sharp, hits and goes Aggravating factors: walking Relieving factors: rest  PRECAUTIONS: None  RED FLAGS: None   WEIGHT BEARING RESTRICTIONS: No  FALLS:  Has patient fallen in last 6 months? No  LIVING ENVIRONMENT: Lives with: lives alone Lives in: House/apartment Stairs: Yes: Internal: 13 steps; on left going up and External: 1 steps; none Has following equipment at home: Single point cane, Walker - 2 wheeled, and Wheelchair (manual)  OCCUPATION: retired  PLOF: Independent  PATIENT GOALS: to be able to walk her hour per day  NEXT MD VISIT: none scheduled  OBJECTIVE:  Note: Objective measures were completed at Evaluation unless otherwise noted.  DIAGNOSTIC FINDINGS: Scheduled  PATIENT SURVEYS:  LEFS 54 / 80 = 67.5 % LEFS 01/13/24 46/80    COGNITION: Overall cognitive status: Within functional limits for tasks assessed     SENSATION: WFL  EDEMA:  Patient reports it swells intermittently  MUSCLE LENGTH: Marked quad tightness  and gastroc B  POSTURE: rounded shoulders, forward head, and flexed trunk  genu valgus B  PALPATION: Lateral right knee  LOWER EXTREMITY ROM:  A/P ROM Right eval Left eval R/L 01/13/24    Knee flexion 99/108 74/80 A: 100 / 92   Knee extension 0 0    (Blank rows = not tested)  LOWER EXTREMITY MMT:  MMT Right eval Left eval R/L 01/13/24    Hip flexion 4- 4- 4 / 4+  Hip extension 5 5   Hip abduction 4- 4-   Hip adduction 5 5   Hip internal rotation     Hip  external rotation     Knee flexion 4- 5 4+ / 5   Knee extension 4+ 5 4+ / 5  Ankle dorsiflexion 5 5    (Blank rows = not tested)   FUNCTIONAL TESTS:  5 times sit to stand: 14.17 sec Stairs - eccentric weakness R, hyperextends right with ascending  01/13/24: - 5xSTS 11.14sec no UE support  - stair assessment in clinic; able to perform reciprocally leading with either LE, unilateral UE support, altered mechanics when leading R ascending, leading L descending  GAIT: Distance walked: 20 Assistive device utilized: None Level of assistance: Complete Independence Comments: genu valgus B  [facility] Adult PT Treatment:                                                DATE: 01/13/24  Neuromuscular re-ed: TKE ball at wall for quad activation x20 Bosu TKE seated 2x12 BIL for quad activation Airex step up 2x8 BIL for single limb stability  Therapeutic Activity: Education/discussion re: progress with PT, symptom behavior as it affects activity tolerance, PT goals/POC  5xSTS + education MSK assessment + education as relates to mobility/tolerance     [facility] Adult PT Treatment:                                                DATE: 01/07/2024 Therapeutic Exercise: Hooklying bent knee fall out + bue TB x15 (B) S/L (R) straight leg hip abd --> small circles CW/CCW in abd (R) SLR in ER 3x10 (R) single leg circles CW/CCW 3x5 each Seated knee extension isometric press with orange PB Seated knee flexion isometric press with orange PB Squats with heels elevated Small range sumo squats Standing resisted hip abd + GTB around ankles 3x10 (B)   [facility] Adult PT Treatment:                                                DATE: 01/01/2024 Therapeutic Exercise: NuStep L6-4 x 5 min (LE only) Prone knee extension (quad set) 2x10 (R) Hooklying hip abd (clamshells) + pt purple band 2x15 Bent knee fall out + pt purple band x10 each leg SLR + deflated coregeous ball behind knee (small range lift) x12 (B) --> (R) SLR  small range + 1#AW x15 Seated TKE into bosu 2x10  BIL LE cues for quad contractions Resisted side stepping + pt blue band above knees   [facility] Adult PT Treatment:  DATE: 12/30/23 Therapeutic Exercise: STS 2x8 from raised mat cues for BOS  Purple band short lever hip abd/ext, x5 BIL cues for form and posture, breath control  Purple band long lever hip abd/ex 2x5 each BIL  SLR x12 BIL LE, seated, cues for knee ext prior to raise TKE into bosu ball, seated 2x12 cues for quad contraction Purple band hamstring curl, seated RLE only 2x12  4 inch step weight shift 3x10 w/ UE support  8 inch step tap, x10 cues for reduced circumduction compensations    [facility] Adult PT Treatment:                                                DATE: 12/24/23 Therapeutic Exercise: Clamshells 2x8, hooklying (pt purple band) cues for knee positioning  Hooklying march (pt purple band) 2x8 BIL LE Seated TKE into bosu 2x10  BIL LE cues for quad contractions 4 inch step weight shift RLE 2x8 w/ BIL UE support cues for reduced anterior tibial translation Seated hip abduction purple band 2x10 cues for form and posture STS from raised mat (+2 airex) 3x5 cues for BOS                                                                                                                              PATIENT EDUCATION:  Education details: rationale for interventions, HEP, progress note activities, PT goals/POC Person educated: Patient Education method: Explanation, Demonstration, Tactile cues, Verbal cues Education comprehension: verbalized understanding, returned demonstration, verbal cues required, tactile cues required, and needs further education     HOME EXERCISE PROGRAM: Access Code: [email or web address] URL: [email or web address] Date: 12/11/2023 Prepared by: Lamarr Price  Exercises - Supine Quadriceps Stretch with Strap on Table (Mirrored)  - 2 x daily - 7 x weekly - 1 sets  - 3 reps - 30-60 sec hold - Supine Active Straight Leg Raise  - 2 x daily - 7 x weekly - 3 sets - 10 reps - Standing Hip Abduction with Counter Support  - 1 x daily - 7 x weekly - 2 sets - 10 reps - Sit to Stand  - 3 x daily - 7 x weekly - 1-2 sets - 10 reps - Clam with Resistance  - 1 x daily - 7 x weekly - 3 sets - 10 reps - Sidelying Hip Abduction with Resistance at Thighs  - 1 x daily - 7 x weekly - 3 sets - 10 reps - Prone Quadriceps Stretch with Strap  - 1 x daily - 7 x weekly - 1 sets - 3-5 reps - 10-30 sec hold  ASSESSMENT:  CLINICAL IMPRESSION: 01/13/2024 Pt arrives for today's progress note without pain, reports R ankle back to baseline. Goals addressed as below, pt with subjective 70% improvement but still not back to walking as much as she  would like. Also demonstrates improvements in LE strength and 5xSTS. Remains limited with knee mobility. Progressing well overall compared to start of care, recommend continuing along current POC to maximize functional tolerance, LE strength/ROM. Tolerates exercises well in clinic today with progressions of familiar exercises. Recommend continuing along current POC in order to address relevant deficits and improve functional tolerance. Pt departs today's session in no acute distress, all voiced questions/concerns addressed appropriately from PT perspective.    EVAL: Patient is a 73 y.o. female who was seen today for physical therapy evaluation and treatment for R knee pain. She has had injections in the past, which have helped, but she is no longer eligible due to low platelets. She has pain primarily with walking. She demonstrates B LE weakness, ROM and flexibility deficits and functional weakness with sit to stands and stairs. She will benefit from skilled PT to address these deficits.     OBJECTIVE IMPAIRMENTS: decreased activity tolerance, difficulty walking, decreased ROM, decreased strength, increased edema, impaired flexibility, postural  dysfunction, and pain.   ACTIVITY LIMITATIONS: lifting, squatting, stairs, transfers, bed mobility, and locomotion level  PARTICIPATION LIMITATIONS: community activity  PERSONAL FACTORS: Age, Fitness, Time since onset of injury/illness/exacerbation, and 1 comorbidity: OA  are also affecting patient's functional outcome.   REHAB POTENTIAL: Good  CLINICAL DECISION MAKING: Evolving/moderate complexity  EVALUATION COMPLEXITY: Low   SHORT TERM GOALS: Target date: 12/29/2023    Ind with initial HEP Baseline: 12/30/23: reports good HEP adherence Goal status: MET  2.  Decreased knee pain by 25%. Baseline:  01/13/24: 70% improvement per pt ; no more than 5/10 in past week Goal status: MET    LONG TERM GOALS: Target date: 02/02/2024   Ind with advanced HEP and its progression Baseline:  01/13/24: good HEP adherence reported Goal status: ONGOING   2. Improved R knee ROM to 0-105 deg to normalize gait and functional mobility Baseline:  01/13/24: see ROM chart above Goal status: ONGOING  3.  Improved LE strength to 5/5 to improve function Baseline:  01/13/24: see MMT chart above Goal status: PROGRESSING   4.  Able to ascend stairs with a reciprocal gait pattern and one UE  Baseline:  01/13/24: able to perform with altered kinematics, pain Goal status: PARTIALLY MET   5.  Decreased knee pain in the by >=75% with ADLs. Baseline:  01/13/24: no pain with ADLs typically Goal status: MET   6.  Improved LEFS  to 63 showing functional improvement Baseline: 54 / 80 = 67.5 % 01/13/24: 46 Goal status: ONGOING    PLAN:  PT FREQUENCY: 2x/week  PT DURATION: 8 weeks  PLANNED INTERVENTIONS: 97164- PT Re-evaluation, 97110-Therapeutic exercises, 97530- Therapeutic activity, 97112- Neuromuscular re-education, 97535- Self Care, [id number]- Manual therapy, [id number]- Gait training, [phone]- Aquatic Therapy, 97014- Electrical stimulation (unattended), 97016- Vasopneumatic device, 97033- Ionotophoresis 4mg /ml  Dexamethasone , Patient/Family education, Balance training, Stair training, Taping, Dry Needling, Joint mobilization, Cryotherapy, and Moist heat  PLAN FOR NEXT SESSION: R knee ROM, LE/glute/hip ER strengthening, stairs    Alm DELENA Jenny PT, DPT 01/13/2024 5:15 PM

## 2024-01-14 DIAGNOSIS — R0789 Other chest pain: Secondary | ICD-10-CM | POA: Diagnosis not present

## 2024-01-15 ENCOUNTER — Ambulatory Visit: Payer: Medicare Other

## 2024-01-15 DIAGNOSIS — M25661 Stiffness of right knee, not elsewhere classified: Secondary | ICD-10-CM

## 2024-01-15 DIAGNOSIS — M6281 Muscle weakness (generalized): Secondary | ICD-10-CM | POA: Diagnosis not present

## 2024-01-15 DIAGNOSIS — G8929 Other chronic pain: Secondary | ICD-10-CM

## 2024-01-15 DIAGNOSIS — M25561 Pain in right knee: Secondary | ICD-10-CM | POA: Diagnosis not present

## 2024-01-15 NOTE — Therapy (Signed)
 OUTPATIENT PHYSICAL THERAPY LOWER EXTREMITY TREATMENT   Patient Name: Jhanvi Drakeford MRN: [id number] DOB:December 31, 1950, 73 y.o., female Today's Date: 01/15/2024    END OF SESSION:  PT End of Session - 01/15/24 1444     Visit Number 11    Date for PT Re-Evaluation 02/02/24    Authorization Type MCR    Progress Note Due on Visit 20    PT Start Time 1445    PT Stop Time 1528    PT Time Calculation (min) 43 min    Activity Tolerance Patient tolerated treatment well    Behavior During Therapy [facility] for tasks assessed/performed            Past Medical History:  Diagnosis Date   Hypertension    Pneumonia    Thrombocytopenia (HCC) 11/15/2020   Past Surgical History:  Procedure Laterality Date   CARPAL TUNNEL RELEASE  1980s   bilateral.    LIPOMA EXCISION     left forearm.    REPLACEMENT TOTAL KNEE Left    Dr. Linford   Patient Active Problem List   Diagnosis Date Noted   Encounter for screening for osteoporosis 10/28/2023   Osteoporosis 10/28/2023   Mitral regurgitation 01/24/2023   Tricuspid regurgitation 01/24/2023   OSA on CPAP 11/15/2022   Paroxysmal A-fib (HCC) 11/12/2022   Primary hypertension 09/17/2022   Thrombocytopenia (HCC) 11/15/2020   White coat syndrome without diagnosis of hypertension 01/21/2017   Bradycardia 12/28/2015   Thyromegaly 12/28/2015   Primary osteoarthritis of right knee, post left knee arthroplasty 06/26/2015    PCP: Alvan Dorothyann BIRCH, MD   REFERRING PROVIDER: Curtis Debby PARAS,*   REFERRING DIAG: F82.88 (ICD-10-CM) - Primary osteoarthritis of right knee   THERAPY DIAG:  Chronic pain of right knee  Stiffness of right knee, not elsewhere classified  Muscle weakness (generalized)  Rationale for Evaluation and Treatment: Rehabilitation  ONSET DATE: October 2024 SUBJECTIVE:   SUBJECTIVE STATEMENT: Patient reports her ankle is feeling much better, states she is waiting until results from stress test come back before starting  walking routine.    PERTINENT HISTORY: L TKR, HTN, low platelets, R plantar fasciitis, previous A fib (had Watchman implanted) PAIN:  Are you having pain? Yes: NPRS scale: no pain now, 4-5/10 at worst Pain location: R knee Pain description: sharp, hits and goes Aggravating factors: walking Relieving factors: rest  PRECAUTIONS: None  RED FLAGS: None   WEIGHT BEARING RESTRICTIONS: No  FALLS:  Has patient fallen in last 6 months? No  LIVING ENVIRONMENT: Lives with: lives alone Lives in: House/apartment Stairs: Yes: Internal: 13 steps; on left going up and External: 1 steps; none Has following equipment at home: Single point cane, Walker - 2 wheeled, and Wheelchair (manual)  OCCUPATION: retired  PLOF: Independent  PATIENT GOALS: to be able to walk her hour per day  NEXT MD VISIT: none scheduled  OBJECTIVE:  Note: Objective measures were completed at Evaluation unless otherwise noted.  DIAGNOSTIC FINDINGS: Scheduled  PATIENT SURVEYS:  LEFS 54 / 80 = 67.5 % LEFS 01/13/24 46/80    COGNITION: Overall cognitive status: Within functional limits for tasks assessed     SENSATION: WFL  EDEMA:  Patient reports it swells intermittently  MUSCLE LENGTH: Marked quad tightness and gastroc B  POSTURE: rounded shoulders, forward head, and flexed trunk  genu valgus B  PALPATION: Lateral right knee  LOWER EXTREMITY ROM:  A/P ROM Right eval Left eval R/L 01/13/24    Knee flexion 99/108 74/80 A: 100 / 92  Knee extension 0 0    (Blank rows = not tested)  LOWER EXTREMITY MMT:  MMT Right eval Left eval R/L 01/13/24    Hip flexion 4- 4- 4 / 4+  Hip extension 5 5   Hip abduction 4- 4-   Hip adduction 5 5   Hip internal rotation     Hip external rotation     Knee flexion 4- 5 4+ / 5   Knee extension 4+ 5 4+ / 5  Ankle dorsiflexion 5 5    (Blank rows = not tested)   FUNCTIONAL TESTS:  5 times sit to stand: 14.17 sec Stairs - eccentric weakness R, hyperextends  right with ascending  01/13/24: - 5xSTS 11.14sec no UE support  - stair assessment in clinic; able to perform reciprocally leading with either LE, unilateral UE support, altered mechanics when leading R ascending, leading L descending  GAIT: Distance walked: 20 Assistive device utilized: None Level of assistance: Complete Independence Comments: genu valgus B   [facility] Adult PT Treatment:                                                DATE: 01/15/2024 Therapeutic Exercise: SLR in ER x15 SAQ (green bolster) + 5#AW 2x10 S/L straight leg hip abd leg raises 2x10 S/L small circle in hip abd x10 CW/CCW Prone HS curl (R) + YTB (felt quad, not HS) Prone isometric HS (R) Gastroc stretch on slantboard R SLB  R SLS diver  Side stepping --> GTB crosse at ankles Leg press: (R) SL 40# eccentric press x12   [facility] Adult PT Treatment:                                                DATE: 01/13/24  Neuromuscular re-ed: TKE ball at wall for quad activation x20 Bosu TKE seated 2x12 BIL for quad activation Airex step up 2x8 BIL for single limb stability  Therapeutic Activity: Education/discussion re: progress with PT, symptom behavior as it affects activity tolerance, PT goals/POC  5xSTS + education MSK assessment + education as relates to mobility/tolerance     [facility] Adult PT Treatment:                                                DATE: 01/07/2024 Therapeutic Exercise: Hooklying bent knee fall out + bue TB x15 (B) S/L (R) straight leg hip abd --> small circles CW/CCW in abd (R) SLR in ER 3x10 (R) single leg circles CW/CCW 3x5 each Seated knee extension isometric press with orange PB Seated knee flexion isometric press with orange PB Squats with heels elevated Small range sumo squats Standing resisted hip abd + GTB around ankles 3x10 (B)  PATIENT EDUCATION:  Education  details: rationale for interventions, HEP, progress note activities, PT goals/POC Person educated: Patient Education method: Explanation, Demonstration, Tactile cues, Verbal cues Education comprehension: verbalized understanding, returned demonstration, verbal cues required, tactile cues required, and needs further education     HOME EXERCISE PROGRAM: Access Code: [email or web address] URL: [email or web address] Date: 12/11/2023 Prepared by: Lamarr Price  Exercises - Supine Quadriceps Stretch with Strap on Table (Mirrored)  - 2 x daily - 7 x weekly - 1 sets - 3 reps - 30-60 sec hold - Supine Active Straight Leg Raise  - 2 x daily - 7 x weekly - 3 sets - 10 reps - Standing Hip Abduction with Counter Support  - 1 x daily - 7 x weekly - 2 sets - 10 reps - Sit to Stand  - 3 x daily - 7 x weekly - 1-2 sets - 10 reps - Clam with Resistance  - 1 x daily - 7 x weekly - 3 sets - 10 reps - Sidelying Hip Abduction with Resistance at Thighs  - 1 x daily - 7 x weekly - 3 sets - 10 reps - Prone Quadriceps Stretch with Strap  - 1 x daily - 7 x weekly - 1 sets - 3-5 reps - 10-30 sec hold  ASSESSMENT:  CLINICAL IMPRESSION:  Single leg stability exercises continued; patient had difficulty connecting to glute activation in single leg stance. Eccentric cues for single leg press with placement of R LE in slight external rotation improved overall control and decreased lower knee pain. Isometric bent knee heel press in prone improved hamstring activation, however with progression to lightly resisted knee flexion patient reported mainly feeling quad activation and no hamstring.   EVAL: Patient is a 73 y.o. female who was seen today for physical therapy evaluation and treatment for R knee pain. She has had injections in the past, which have helped, but she is no longer eligible due to low platelets. She has pain primarily with walking. She demonstrates B LE weakness, ROM and flexibility deficits and functional  weakness with sit to stands and stairs. She will benefit from skilled PT to address these deficits.     OBJECTIVE IMPAIRMENTS: decreased activity tolerance, difficulty walking, decreased ROM, decreased strength, increased edema, impaired flexibility, postural dysfunction, and pain.   ACTIVITY LIMITATIONS: lifting, squatting, stairs, transfers, bed mobility, and locomotion level  PARTICIPATION LIMITATIONS: community activity  PERSONAL FACTORS: Age, Fitness, Time since onset of injury/illness/exacerbation, and 1 comorbidity: OA  are also affecting patient's functional outcome.   REHAB POTENTIAL: Good  CLINICAL DECISION MAKING: Evolving/moderate complexity  EVALUATION COMPLEXITY: Low   SHORT TERM GOALS: Target date: 12/29/2023    Ind with initial HEP Baseline: 12/30/23: reports good HEP adherence Goal status: MET  2.  Decreased knee pain by 25%. Baseline:  01/13/24: 70% improvement per pt ; no more than 5/10 in past week Goal status: MET    LONG TERM GOALS: Target date: 02/02/2024   Ind with advanced HEP and its progression Baseline:  01/13/24: good HEP adherence reported Goal status: ONGOING   2. Improved R knee ROM to 0-105 deg to normalize gait and functional mobility Baseline:  01/13/24: see ROM chart above Goal status: ONGOING  3.  Improved LE strength to 5/5 to improve function Baseline:  01/13/24: see MMT chart above Goal status: PROGRESSING   4.  Able to ascend stairs with a reciprocal gait pattern and one UE  Baseline:  01/13/24: able to perform with altered kinematics, pain Goal status:  PARTIALLY MET   5.  Decreased knee pain in the by >=75% with ADLs. Baseline:  01/13/24: no pain with ADLs typically Goal status: MET   6.  Improved LEFS  to 63 showing functional improvement Baseline: 54 / 80 = 67.5 % 01/13/24: 46 Goal status: ONGOING    PLAN:  PT FREQUENCY: 2x/week  PT DURATION: 8 weeks  PLANNED INTERVENTIONS: 97164- PT Re-evaluation, 97110-Therapeutic  exercises, 97530- Therapeutic activity, 97112- Neuromuscular re-education, 97535- Self Care, [id number]- Manual therapy, [id number]- Gait training, [phone]- Aquatic Therapy, 97014- Electrical stimulation (unattended), 97016- Vasopneumatic device, 97033- Ionotophoresis 4mg /ml Dexamethasone , Patient/Family education, Balance training, Stair training, Taping, Dry Needling, Joint mobilization, Cryotherapy, and Moist heat  PLAN FOR NEXT SESSION: R knee ROM, LE/glute/hip ER strengthening, stairs    Lamarr Price, PTA 01/15/2024 3:29 PM

## 2024-01-16 DIAGNOSIS — I2510 Atherosclerotic heart disease of native coronary artery without angina pectoris: Secondary | ICD-10-CM | POA: Diagnosis not present

## 2024-01-16 DIAGNOSIS — R7989 Other specified abnormal findings of blood chemistry: Secondary | ICD-10-CM | POA: Diagnosis not present

## 2024-01-16 DIAGNOSIS — Z7902 Long term (current) use of antithrombotics/antiplatelets: Secondary | ICD-10-CM | POA: Diagnosis not present

## 2024-01-16 DIAGNOSIS — F17210 Nicotine dependence, cigarettes, uncomplicated: Secondary | ICD-10-CM | POA: Diagnosis not present

## 2024-01-16 DIAGNOSIS — Z79899 Other long term (current) drug therapy: Secondary | ICD-10-CM | POA: Diagnosis not present

## 2024-01-16 DIAGNOSIS — Z7982 Long term (current) use of aspirin: Secondary | ICD-10-CM | POA: Diagnosis not present

## 2024-01-16 DIAGNOSIS — R079 Chest pain, unspecified: Secondary | ICD-10-CM | POA: Diagnosis not present

## 2024-01-16 DIAGNOSIS — D696 Thrombocytopenia, unspecified: Secondary | ICD-10-CM | POA: Diagnosis not present

## 2024-01-16 DIAGNOSIS — I4891 Unspecified atrial fibrillation: Secondary | ICD-10-CM | POA: Diagnosis not present

## 2024-01-16 DIAGNOSIS — R0602 Shortness of breath: Secondary | ICD-10-CM | POA: Diagnosis not present

## 2024-01-16 DIAGNOSIS — I517 Cardiomegaly: Secondary | ICD-10-CM | POA: Diagnosis not present

## 2024-01-16 DIAGNOSIS — R072 Precordial pain: Secondary | ICD-10-CM | POA: Diagnosis not present

## 2024-01-16 DIAGNOSIS — R042 Hemoptysis: Secondary | ICD-10-CM | POA: Diagnosis not present

## 2024-01-17 DIAGNOSIS — R042 Hemoptysis: Secondary | ICD-10-CM | POA: Diagnosis not present

## 2024-01-17 DIAGNOSIS — I517 Cardiomegaly: Secondary | ICD-10-CM | POA: Diagnosis not present

## 2024-01-17 DIAGNOSIS — I2510 Atherosclerotic heart disease of native coronary artery without angina pectoris: Secondary | ICD-10-CM | POA: Diagnosis not present

## 2024-01-19 ENCOUNTER — Telehealth: Payer: Self-pay

## 2024-01-19 NOTE — Telephone Encounter (Signed)
 Received VM from pt reporting she was in the ED over the weekend with a "blood incident" and was instructed to contact her "platelet doctor" & let him know.   According to ED notes, pt had one episode of hemoptysis. Platelets were stable at 88. Pt is on Plavix and asa prescribed by cardiology.   Dr Maria Shiner reviewed the ED note and is comfortable with pt keeping f/u in March. If another bleeding episode occurs, pt should contact cardiologist.   Attempted to contact pt multiple times. Phone rings then disconnects. No opportunity to leave a message. dph

## 2024-01-20 ENCOUNTER — Ambulatory Visit (INDEPENDENT_AMBULATORY_CARE_PROVIDER_SITE_OTHER): Payer: Medicare Other | Admitting: Family Medicine

## 2024-01-20 ENCOUNTER — Encounter: Payer: Self-pay | Admitting: Physical Therapy

## 2024-01-20 ENCOUNTER — Ambulatory Visit: Payer: Medicare Other | Admitting: Physical Therapy

## 2024-01-20 ENCOUNTER — Encounter: Payer: Self-pay | Admitting: Family Medicine

## 2024-01-20 VITALS — BP 165/76 | HR 75 | Ht 63.0 in | Wt 192.0 lb

## 2024-01-20 DIAGNOSIS — K1379 Other lesions of oral mucosa: Secondary | ICD-10-CM

## 2024-01-20 DIAGNOSIS — M25661 Stiffness of right knee, not elsewhere classified: Secondary | ICD-10-CM | POA: Diagnosis not present

## 2024-01-20 DIAGNOSIS — G8929 Other chronic pain: Secondary | ICD-10-CM

## 2024-01-20 DIAGNOSIS — M6281 Muscle weakness (generalized): Secondary | ICD-10-CM

## 2024-01-20 DIAGNOSIS — M25561 Pain in right knee: Secondary | ICD-10-CM | POA: Diagnosis not present

## 2024-01-20 NOTE — Progress Notes (Signed)
Established Patient Office Visit  Subjective  Patient ID: Margaret Smith, female    DOB: October 10, 1951  Age: 73 y.o. MRN: [id number]  Chief Complaint  Patient presents with   Hospitalization Follow-up    HPI  Margaret Smith 73 year old female who is here today for follow-up from recent emergency department visit on February 7 blood in her mouth.  She says she was taking a shower and had gotten some water in her nose and spit and noticed some bright red blood.  She was not coughing or sick. .She is on Plavix and ASA.Marland Kitchen  She even felt a little lightheaded before coming to the emergency department.  She had no other way felt bad.  She was not able to determine if there was any trauma to her tongue or mouth.  They discussed her following up with her hematologist.  They did do a CT angiogram.  Negative for pulmonary embolism or infiltrate.  No effusion.  They did note some cardiomegaly with cardiology artery calcification.  She had just had a nuclear stress test the week before that came back negative.  She says for several hours she noticed just a little bit of continued blood on her tongue she almost wonders if it was coming from the roof of her mouth because later after the bleeding stopped she noticed a piece of dark red blood most like clotted on the roof of her mouth.  She denies any sore throat she is not having any bleeding since then.    ROS    Objective:     BP (!) 165/76   Pulse 75   Ht 5\' 3"  (1.6 m)   Wt 192 lb (87.1 kg)   SpO2 100%   BMI 34.01 kg/m    Physical Exam Constitutional:      Appearance: Normal appearance.  HENT:     Head: Normocephalic and atraumatic.     Right Ear: Tympanic membrane, ear canal and external ear normal. There is no impacted cerumen.     Left Ear: Tympanic membrane, ear canal and external ear normal. There is no impacted cerumen.     Nose: Nose normal.     Mouth/Throat:     Pharynx: Oropharynx is clear.  Eyes:     Conjunctiva/sclera: Conjunctivae  normal.  Cardiovascular:     Rate and Rhythm: Normal rate and regular rhythm.  Pulmonary:     Effort: Pulmonary effort is normal.     Breath sounds: Normal breath sounds.  Musculoskeletal:     Cervical back: Neck supple. No tenderness.  Lymphadenopathy:     Cervical: No cervical adenopathy.  Skin:    General: Skin is warm and dry.  Neurological:     Mental Status: She is alert and oriented to person, place, and time.  Psychiatric:        Mood and Affect: Mood normal.    No results found for any visits on 01/20/24.    The 10-year ASCVD risk score (Arnett DK, et al., 2019) is: 16.8%    Assessment & Plan:   Problem List Items Addressed This Visit   None Visit Diagnoses       Blood in mouth of unknown source    -  Primary       I gave her reassurance-I do not hear anything that sounds alarming or concerning.  CT was negative for infection PE etc.  I really wonder if she could have had a little bit of trauma inside her mouth even though she  does not still remember biting her tongue or if it even could have come from her sinuses that she had had a little bit of water get in her nose in the shower.  It did stop.  And she is not having any cough or chest pain.  She does have a routine follow-up with hematology in about 4 weeks.  If she develops any new or worsening symptoms then please let us know.  ED notes imaging and labs reviewed.   No follow-ups on file.    Nani Gasser, MD

## 2024-01-20 NOTE — Therapy (Signed)
OUTPATIENT PHYSICAL THERAPY LOWER EXTREMITY TREATMENT   Patient Name: Margaret Smith MRN: [id number] DOB:1951/07/27, 73 y.o., female Today's Date: 01/20/2024    END OF SESSION:  PT End of Session - 01/20/24 1358     Visit Number 12    Date for PT Re-Evaluation 02/02/24    Authorization Type MCR    Progress Note Due on Visit 20    PT Start Time 1359    PT Stop Time 1441    PT Time Calculation (min) 42 min    Activity Tolerance Patient tolerated treatment well            Past Medical History:  Diagnosis Date   Hypertension    Pneumonia    Thrombocytopenia (HCC) 11/15/2020   Past Surgical History:  Procedure Laterality Date   CARPAL TUNNEL RELEASE  1980s   bilateral.    LIPOMA EXCISION     left forearm.    REPLACEMENT TOTAL KNEE Left    Dr. Corinna Capra   Patient Active Problem List   Diagnosis Date Noted   Encounter for screening for osteoporosis 10/28/2023   Osteoporosis 10/28/2023   Mitral regurgitation 01/24/2023   Tricuspid regurgitation 01/24/2023   OSA on CPAP 11/15/2022   Paroxysmal A-fib (HCC) 11/12/2022   Primary hypertension 09/17/2022   Thrombocytopenia (HCC) 11/15/2020   White coat syndrome without diagnosis of hypertension 01/21/2017   Bradycardia 12/28/2015   Thyromegaly 12/28/2015   Primary osteoarthritis of right knee, post left knee arthroplasty 06/26/2015    PCP: Agapito Games, MD   REFERRING PROVIDER: Monica Becton,*   REFERRING DIAG: K02.54 (ICD-10-CM) - Primary osteoarthritis of right knee   THERAPY DIAG:  Chronic pain of right knee  Stiffness of right knee, not elsewhere classified  Muscle weakness (generalized)  Rationale for Evaluation and Treatment: Rehabilitation  ONSET DATE: October 2024 SUBJECTIVE:   SUBJECTIVE STATEMENT: 01/20/2024 pt states she had a bout of spitting blood over weekend, went to ED and got imaging which was reassuring. Also just saw PCP today and pt states she didn't have any acute concerns.  Has been feeling good overall. Knee has been doing okay, had some mild soreness.    PERTINENT HISTORY: L TKR, HTN, low platelets, R plantar fasciitis, previous A fib (had Watchman implanted) PAIN:  Are you having pain? Yes: NPRS scale: no pain now, 4/10 at worst since last visit Pain location: R knee Pain description: sharp, hits and goes Aggravating factors: walking Relieving factors: rest  PRECAUTIONS: None  RED FLAGS: None   WEIGHT BEARING RESTRICTIONS: No  FALLS:  Has patient fallen in last 6 months? No  LIVING ENVIRONMENT: Lives with: lives alone Lives in: House/apartment Stairs: Yes: Internal: 13 steps; on left going up and External: 1 steps; none Has following equipment at home: Single point cane, Walker - 2 wheeled, and Wheelchair (manual)  OCCUPATION: retired  PLOF: Independent  PATIENT GOALS: to be able to walk her hour per day  NEXT MD VISIT: none scheduled  OBJECTIVE:  Note: Objective measures were completed at Evaluation unless otherwise noted.  DIAGNOSTIC FINDINGS: Scheduled  PATIENT SURVEYS:  LEFS 54 / 80 = 67.5 % LEFS 01/13/24 46/80    COGNITION: Overall cognitive status: Within functional limits for tasks assessed     SENSATION: WFL  EDEMA:  Patient reports it swells intermittently  MUSCLE LENGTH: Marked quad tightness and gastroc B  POSTURE: rounded shoulders, forward head, and flexed trunk  genu valgus B  PALPATION: Lateral right knee  LOWER EXTREMITY ROM:  A/P ROM Right eval Left eval R/L 01/13/24    Knee flexion 99/108 74/80 A: 100 / 92   Knee extension 0 0    (Blank rows = not tested)  LOWER EXTREMITY MMT:  MMT Right eval Left eval R/L 01/13/24    Hip flexion 4- 4- 4 / 4+  Hip extension 5 5   Hip abduction 4- 4-   Hip adduction 5 5   Hip internal rotation     Hip external rotation     Knee flexion 4- 5 4+ / 5   Knee extension 4+ 5 4+ / 5  Ankle dorsiflexion 5 5    (Blank rows = not tested)   FUNCTIONAL TESTS:  5  times sit to stand: 14.17 sec Stairs - eccentric weakness R, hyperextends right with ascending  01/13/24: - 5xSTS 11.14sec no UE support  - stair assessment in clinic; able to perform reciprocally leading with either LE, unilateral UE support, altered mechanics when leading R ascending, leading L descending  GAIT: Distance walked: 20 Assistive device utilized: None Level of assistance: Complete Independence Comments: genu valgus B    [facility] Adult PT Treatment:                                                DATE: 01/20/24 Therapeutic Exercise: GTB lateral stepping 6 laps at counter cues for mechanics Green band hip ext 45 deg 2x8 BIL  Green band hamstring curl, seated 2x10 BIL  Therapeutic Activity: Mini squat BIL UE support x12, x15 cues for mechanics/pacing 4 inch step up fwd, 4x8 cues for mechanics, mitigating quad avoidance SLS 2bouts to fatigue BIL   [facility] Adult PT Treatment:                                                DATE: 01/15/2024 Therapeutic Exercise: SLR in ER x15 SAQ (green bolster) + 5#AW 2x10 S/L straight leg hip abd leg raises 2x10 S/L small circle in hip abd x10 CW/CCW Prone HS curl (R) + YTB (felt quad, not HS) Prone isometric HS (R) Gastroc stretch on slantboard R SLB  R SLS diver  Side stepping --> GTB crosse at ankles Leg press: (R) SL 40# eccentric press x12   [facility] Adult PT Treatment:                                                DATE: 01/13/24  Neuromuscular re-ed: TKE ball at wall for quad activation x20 Bosu TKE seated 2x12 BIL for quad activation Airex step up 2x8 BIL for single limb stability  Therapeutic Activity: Education/discussion re: progress with PT, symptom behavior as it affects activity tolerance, PT goals/POC  5xSTS + education MSK assessment + education as relates to mobility/tolerance     [facility] Adult PT Treatment:                                                DATE: 01/07/2024 Therapeutic Exercise: Hooklying  bent knee fall out +  bue TB x15 (B) S/L (R) straight leg hip abd --> small circles CW/CCW in abd (R) SLR in ER 3x10 (R) single leg circles CW/CCW 3x5 each Seated knee extension isometric press with orange PB Seated knee flexion isometric press with orange PB Squats with heels elevated Small range sumo squats Standing resisted hip abd + GTB around ankles 3x10 (B)                                                                                                                              PATIENT EDUCATION:  Education details: rationale for interventions, HEP  Person educated: Patient Education method: Explanation, Demonstration, Tactile cues, Verbal cues Education comprehension: verbalized understanding, returned demonstration, verbal cues required, tactile cues required, and needs further education     HOME EXERCISE PROGRAM: Access Code: [email or web address] URL: [email or web address] Date: 12/11/2023 Prepared by: Carlynn Herald  Exercises - Supine Quadriceps Stretch with Strap on Table (Mirrored)  - 2 x daily - 7 x weekly - 1 sets - 3 reps - 30-60 sec hold - Supine Active Straight Leg Raise  - 2 x daily - 7 x weekly - 3 sets - 10 reps - Standing Hip Abduction with Counter Support  - 1 x daily - 7 x weekly - 2 sets - 10 reps - Sit to Stand  - 3 x daily - 7 x weekly - 1-2 sets - 10 reps - Clam with Resistance  - 1 x daily - 7 x weekly - 3 sets - 10 reps - Sidelying Hip Abduction with Resistance at Thighs  - 1 x daily - 7 x weekly - 3 sets - 10 reps - Prone Quadriceps Stretch with Strap  - 1 x daily - 7 x weekly - 1 sets - 3-5 reps - 10-30 sec hold  ASSESSMENT:  CLINICAL IMPRESSION:  01/20/2024 Pt arrives w/o pain -does endorse an ED visit over the weekend, concerns with spitting blood but states ED work up and PCP follow up today were both reportedly reassuring. She also reports her stress test was negative. Today pt does well with activities overall, much of session spent working on closed chain LE  strengthening. Emphasis on minimizing quad avoidance with step ups, improves w/ repetition and heavy cues. No adverse events, pt denies any pain on departure. Recommend continuing along current POC in order to address relevant deficits and improve functional tolerance. Pt departs today's session in no acute distress, all voiced questions/concerns addressed appropriately from PT perspective.     EVAL: Patient is a 73 y.o. female who was seen today for physical therapy evaluation and treatment for R knee pain. She has had injections in the past, which have helped, but she is no longer eligible due to low platelets. She has pain primarily with walking. She demonstrates B LE weakness, ROM and flexibility deficits and functional weakness with sit to stands and stairs. She will benefit from skilled PT to  address these deficits.     OBJECTIVE IMPAIRMENTS: decreased activity tolerance, difficulty walking, decreased ROM, decreased strength, increased edema, impaired flexibility, postural dysfunction, and pain.   ACTIVITY LIMITATIONS: lifting, squatting, stairs, transfers, bed mobility, and locomotion level  PARTICIPATION LIMITATIONS: community activity  PERSONAL FACTORS: Age, Fitness, Time since onset of injury/illness/exacerbation, and 1 comorbidity: OA  are also affecting patient's functional outcome.   REHAB POTENTIAL: Good  CLINICAL DECISION MAKING: Evolving/moderate complexity  EVALUATION COMPLEXITY: Low   SHORT TERM GOALS: Target date: 12/29/2023    Ind with initial HEP Baseline: 12/30/23: reports good HEP adherence Goal status: MET  2.  Decreased knee pain by 25%. Baseline:  01/13/24: 70% improvement per pt ; no more than 5/10 in past week Goal status: MET    LONG TERM GOALS: Target date: 02/02/2024   Ind with advanced HEP and its progression Baseline:  01/13/24: good HEP adherence reported Goal status: ONGOING   2. Improved R knee ROM to 0-105 deg to normalize gait and functional  mobility Baseline:  01/13/24: see ROM chart above Goal status: ONGOING  3.  Improved LE strength to 5/5 to improve function Baseline:  01/13/24: see MMT chart above Goal status: PROGRESSING   4.  Able to ascend stairs with a reciprocal gait pattern and one UE  Baseline:  01/13/24: able to perform with altered kinematics, pain Goal status: PARTIALLY MET   5.  Decreased knee pain in the by >=75% with ADLs. Baseline:  01/13/24: no pain with ADLs typically Goal status: MET   6.  Improved LEFS  to 63 showing functional improvement Baseline: 54 / 80 = 67.5 % 01/13/24: 46 Goal status: ONGOING    PLAN:  PT FREQUENCY: 2x/week  PT DURATION: 8 weeks  PLANNED INTERVENTIONS: 97164- PT Re-evaluation, 97110-Therapeutic exercises, 97530- Therapeutic activity, 97112- Neuromuscular re-education, 97535- Self Care, 96295- Manual therapy, [id number]- Gait training, [phone]- Aquatic Therapy, 97014- Electrical stimulation (unattended), 97016- Vasopneumatic device, 97033- Ionotophoresis 4mg /ml Dexamethasone, Patient/Family education, Balance training, Stair training, Taping, Dry Needling, Joint mobilization, Cryotherapy, and Moist heat  PLAN FOR NEXT SESSION: R knee ROM, LE/glute/hip ER strengthening, stairs    Ashley Murrain PT, DPT 01/20/2024 2:49 PM

## 2024-01-22 ENCOUNTER — Ambulatory Visit: Payer: Medicare Other

## 2024-01-22 DIAGNOSIS — M6281 Muscle weakness (generalized): Secondary | ICD-10-CM | POA: Diagnosis not present

## 2024-01-22 DIAGNOSIS — M25661 Stiffness of right knee, not elsewhere classified: Secondary | ICD-10-CM | POA: Diagnosis not present

## 2024-01-22 DIAGNOSIS — M25561 Pain in right knee: Secondary | ICD-10-CM | POA: Diagnosis not present

## 2024-01-22 DIAGNOSIS — G8929 Other chronic pain: Secondary | ICD-10-CM

## 2024-01-22 NOTE — Therapy (Signed)
OUTPATIENT PHYSICAL THERAPY LOWER EXTREMITY TREATMENT   Patient Name: Margaret Smith MRN: [id number] DOB:Feb 11, 1951, 73 y.o., female Today's Date: 01/22/2024    END OF SESSION:  PT End of Session - 01/22/24 1447     Visit Number 13    Date for PT Re-Evaluation 02/02/24    Authorization Type MCR    Progress Note Due on Visit 20    PT Start Time 1448    PT Stop Time 1526    PT Time Calculation (min) 38 min    Activity Tolerance Patient tolerated treatment well    Behavior During Therapy [facility] for tasks assessed/performed            Past Medical History:  Diagnosis Date   Hypertension    Pneumonia    Thrombocytopenia (HCC) 11/15/2020   Past Surgical History:  Procedure Laterality Date   CARPAL TUNNEL RELEASE  1980s   bilateral.    LIPOMA EXCISION     left forearm.    REPLACEMENT TOTAL KNEE Left    Dr. Corinna Capra   Patient Active Problem List   Diagnosis Date Noted   Encounter for screening for osteoporosis 10/28/2023   Osteoporosis 10/28/2023   Mitral regurgitation 01/24/2023   Tricuspid regurgitation 01/24/2023   OSA on CPAP 11/15/2022   Paroxysmal A-fib (HCC) 11/12/2022   Primary hypertension 09/17/2022   Thrombocytopenia (HCC) 11/15/2020   White coat syndrome without diagnosis of hypertension 01/21/2017   Bradycardia 12/28/2015   Thyromegaly 12/28/2015   Primary osteoarthritis of right knee, post left knee arthroplasty 06/26/2015    PCP: Agapito Games, MD   REFERRING PROVIDER: Monica Becton,*   REFERRING DIAG: X52.84 (ICD-10-CM) - Primary osteoarthritis of right knee   THERAPY DIAG:  Chronic pain of right knee  Stiffness of right knee, not elsewhere classified  Muscle weakness (generalized)  Rationale for Evaluation and Treatment: Rehabilitation  ONSET DATE: October 2024 SUBJECTIVE:   SUBJECTIVE STATEMENT: Patient reports her knee is feeling good today.    PERTINENT HISTORY: L TKR, HTN, low platelets, R plantar fasciitis,  previous A fib (had Watchman implanted) PAIN:  Are you having pain? Yes: NPRS scale: no pain now, 4/10 at worst since last visit Pain location: R knee Pain description: sharp, hits and goes Aggravating factors: walking Relieving factors: rest  PRECAUTIONS: None  RED FLAGS: None   WEIGHT BEARING RESTRICTIONS: No  FALLS:  Has patient fallen in last 6 months? No  LIVING ENVIRONMENT: Lives with: lives alone Lives in: House/apartment Stairs: Yes: Internal: 13 steps; on left going up and External: 1 steps; none Has following equipment at home: Single point cane, Walker - 2 wheeled, and Wheelchair (manual)  OCCUPATION: retired  PLOF: Independent  PATIENT GOALS: to be able to walk her hour per day  NEXT MD VISIT: none scheduled  OBJECTIVE:  Note: Objective measures were completed at Evaluation unless otherwise noted.  DIAGNOSTIC FINDINGS: Scheduled  PATIENT SURVEYS:  LEFS 54 / 80 = 67.5 % LEFS 01/13/24 46/80    COGNITION: Overall cognitive status: Within functional limits for tasks assessed     SENSATION: WFL  EDEMA:  Patient reports it swells intermittently  MUSCLE LENGTH: Marked quad tightness and gastroc B  POSTURE: rounded shoulders, forward head, and flexed trunk  genu valgus B  PALPATION: Lateral right knee  LOWER EXTREMITY ROM:  A/P ROM Right eval Left eval R/L 01/13/24    Knee flexion 99/108 74/80 A: 100 / 92   Knee extension 0 0    (Blank rows =  not tested)  LOWER EXTREMITY MMT:  MMT Right eval Left eval R/L 01/13/24    Hip flexion 4- 4- 4 / 4+  Hip extension 5 5   Hip abduction 4- 4-   Hip adduction 5 5   Hip internal rotation     Hip external rotation     Knee flexion 4- 5 4+ / 5   Knee extension 4+ 5 4+ / 5  Ankle dorsiflexion 5 5    (Blank rows = not tested)   FUNCTIONAL TESTS:  5 times sit to stand: 14.17 sec Stairs - eccentric weakness R, hyperextends right with ascending  01/13/24: - 5xSTS 11.14sec no UE support  - stair  assessment in clinic; able to perform reciprocally leading with either LE, unilateral UE support, altered mechanics when leading R ascending, leading L descending  GAIT: Distance walked: 20 Assistive device utilized: None Level of assistance: Complete Independence Comments: genu valgus B   [facility] Adult PT Treatment:                                                DATE: 01/22/2024 Therapeutic Exercise: Resisted side stepping + GTB crossed at ankles  Resisted diagonal stepping +  GTB crossed at ankles --> fwd & bkwd Mini squat + side step out/in + blue --> black TB above knees Standing HS curls + GTB 2x12 (B) Seated knee extension + GTB x10 --> slow pace 5x5" Therapeutic Activity: Walking fwd/bkwd for stride length  Side marching + same knee taps --> stride length and dynamic balance 4" step fwd shifting for quad control and activation    [facility] Adult PT Treatment:                                                DATE: 01/20/24 Therapeutic Exercise: GTB lateral stepping 6 laps at counter cues for mechanics Green band hip ext 45 deg 2x8 BIL  Green band hamstring curl, seated 2x10 BIL Therapeutic Activity: Mini squat BIL UE support x12, x15 cues for mechanics/pacing 4 inch step up fwd, 4x8 cues for mechanics, mitigating quad avoidance SLS 2bouts to fatigue BIL    [facility] Adult PT Treatment:                                                DATE: 01/15/2024 Therapeutic Exercise: SLR in ER x15 SAQ (green bolster) + 5#AW 2x10 S/L straight leg hip abd leg raises 2x10 S/L small circle in hip abd x10 CW/CCW Prone HS curl (R) + YTB (felt quad, not HS) Prone isometric HS (R) Gastroc stretch on slantboard R SLB  R SLS diver  Side stepping --> GTB crosse at ankles Leg press: (R) SL 40# eccentric press x12  PATIENT EDUCATION:  Education details: rationale for interventions,  HEP  Person educated: Patient Education method: Explanation, Demonstration, Tactile cues, Verbal cues Education comprehension: verbalized understanding, returned demonstration, verbal cues required, tactile cues required, and needs further education     HOME EXERCISE PROGRAM: Access Code: [email or web address] URL: [email or web address] Date: 12/11/2023 Prepared by: Carlynn Herald  Exercises - Supine Quadriceps Stretch with Strap on Table (Mirrored)  - 2 x daily - 7 x weekly - 1 sets - 3 reps - 30-60 sec hold - Supine Active Straight Leg Raise  - 2 x daily - 7 x weekly - 3 sets - 10 reps - Standing Hip Abduction with Counter Support  - 1 x daily - 7 x weekly - 2 sets - 10 reps - Sit to Stand  - 3 x daily - 7 x weekly - 1-2 sets - 10 reps - Clam with Resistance  - 1 x daily - 7 x weekly - 3 sets - 10 reps - Sidelying Hip Abduction with Resistance at Thighs  - 1 x daily - 7 x weekly - 3 sets - 10 reps - Prone Quadriceps Stretch with Strap  - 1 x daily - 7 x weekly - 1 sets - 3-5 reps - 10-30 sec hold  ASSESSMENT:  CLINICAL IMPRESSION: Frequent cueing provided with tactile cues to improve quad activation and control during slow step ups on low step; patient challenged with quad control with standing total knee extension. Patient will continue to benefit from skilled therapy to address strength deficits and improve body mechanics.   EVAL: Patient is a 73 y.o. female who was seen today for physical therapy evaluation and treatment for R knee pain. She has had injections in the past, which have helped, but she is no longer eligible due to low platelets. She has pain primarily with walking. She demonstrates B LE weakness, ROM and flexibility deficits and functional weakness with sit to stands and stairs. She will benefit from skilled PT to address these deficits.     OBJECTIVE IMPAIRMENTS: decreased activity tolerance, difficulty walking, decreased ROM, decreased strength, increased edema,  impaired flexibility, postural dysfunction, and pain.   ACTIVITY LIMITATIONS: lifting, squatting, stairs, transfers, bed mobility, and locomotion level  PARTICIPATION LIMITATIONS: community activity  PERSONAL FACTORS: Age, Fitness, Time since onset of injury/illness/exacerbation, and 1 comorbidity: OA  are also affecting patient's functional outcome.   REHAB POTENTIAL: Good  CLINICAL DECISION MAKING: Evolving/moderate complexity  EVALUATION COMPLEXITY: Low   SHORT TERM GOALS: Target date: 12/29/2023    Ind with initial HEP Baseline: 12/30/23: reports good HEP adherence Goal status: MET  2.  Decreased knee pain by 25%. Baseline:  01/13/24: 70% improvement per pt ; no more than 5/10 in past week Goal status: MET    LONG TERM GOALS: Target date: 02/02/2024   Ind with advanced HEP and its progression Baseline:  01/13/24: good HEP adherence reported Goal status: ONGOING   2. Improved R knee ROM to 0-105 deg to normalize gait and functional mobility Baseline:  01/13/24: see ROM chart above Goal status: ONGOING  3.  Improved LE strength to 5/5 to improve function Baseline:  01/13/24: see MMT chart above Goal status: PROGRESSING   4.  Able to ascend stairs with a reciprocal gait pattern and one UE  Baseline:  01/13/24: able to perform with altered kinematics, pain Goal status: PARTIALLY MET   5.  Decreased knee pain in the by >=75% with ADLs. Baseline:  01/13/24: no pain with ADLs typically Goal status: MET  6.  Improved LEFS  to 63 showing functional improvement Baseline: 54 / 80 = 67.5 % 01/13/24: 46 Goal status: ONGOING    PLAN:  PT FREQUENCY: 2x/week  PT DURATION: 8 weeks  PLANNED INTERVENTIONS: 97164- PT Re-evaluation, 97110-Therapeutic exercises, 97530- Therapeutic activity, 97112- Neuromuscular re-education, 97535- Self Care, 81191- Manual therapy, [id number]- Gait training, [phone]- Aquatic Therapy, 97014- Electrical stimulation (unattended), 97016- Vasopneumatic device,  97033- Ionotophoresis 4mg /ml Dexamethasone, Patient/Family education, Balance training, Stair training, Taping, Dry Needling, Joint mobilization, Cryotherapy, and Moist heat  PLAN FOR NEXT SESSION: R knee ROM, LE/glute/hip ER strengthening, stairs    Carlynn Herald, PTA 01/22/2024 3:27 PM

## 2024-01-26 NOTE — Therapy (Addendum)
 OUTPATIENT PHYSICAL THERAPY LOWER EXTREMITY TREATMENT  PHYSICAL THERAPY DISCHARGE SUMMARY  Visits from Start of Care: 14  Current functional level related to goals / functional outcomes: See progress note for discharge status    Remaining deficits: Unknown    Education / Equipment: HEP    Patient agrees to discharge. Patient goals were partially met. Patient is being discharged due to being pleased with the current functional level.  Celyn P. Geraldo Klippel PT, MPH 04/07/24 9:23 AM For: Lovett Ruck PT, DPT   Patient Name: Margaret Smith MRN: [id number] DOB:05-15-1951, 73 y.o., female Today's Date: 01/27/2024    END OF SESSION:  PT End of Session - 01/27/24 1402     Visit Number 14    Date for PT Re-Evaluation 02/02/24    Authorization Type MCR    Progress Note Due on Visit 20    PT Start Time 1402    PT Stop Time 1442    PT Time Calculation (min) 40 min    Activity Tolerance Patient tolerated treatment well             Past Medical History:  Diagnosis Date   Hypertension    Pneumonia    Thrombocytopenia (HCC) 11/15/2020   Past Surgical History:  Procedure Laterality Date   CARPAL TUNNEL RELEASE  1980s   bilateral.    LIPOMA EXCISION     left forearm.    REPLACEMENT TOTAL KNEE Left    Dr. Chaneta Comer   Patient Active Problem List   Diagnosis Date Noted   Encounter for screening for osteoporosis 10/28/2023   Osteoporosis 10/28/2023   Mitral regurgitation 01/24/2023   Tricuspid regurgitation 01/24/2023   OSA on CPAP 11/15/2022   Paroxysmal A-fib (HCC) 11/12/2022   Primary hypertension 09/17/2022   Thrombocytopenia (HCC) 11/15/2020   White coat syndrome without diagnosis of hypertension 01/21/2017   Bradycardia 12/28/2015   Thyromegaly 12/28/2015   Primary osteoarthritis of right knee, post left knee arthroplasty 06/26/2015    PCP: Cydney Draft, MD   REFERRING PROVIDER: Gean Keels,*   REFERRING DIAG: W09.81 (ICD-10-CM) - Primary  osteoarthritis of right knee   THERAPY DIAG:  Chronic pain of right knee  Stiffness of right knee, not elsewhere classified  Muscle weakness (generalized)  Rationale for Evaluation and Treatment: Rehabilitation  ONSET DATE: October 2024 SUBJECTIVE:   SUBJECTIVE STATEMENT: No new updates, overall doing well. Did well after last session. States she was very active yesterday and had some more pain. Has been able to walk more with some increases in pain but improving.    PERTINENT HISTORY: L TKR, HTN, low platelets, R plantar fasciitis, previous A fib (had Watchman implanted) PAIN:  Are you having pain? Yes: NPRS scale: 3-4/10  at present Pain location: R knee Pain description: sharp, hits and goes Aggravating factors: walking Relieving factors: rest  PRECAUTIONS: None  RED FLAGS: None   WEIGHT BEARING RESTRICTIONS: No  FALLS:  Has patient fallen in last 6 months? No  LIVING ENVIRONMENT: Lives with: lives alone Lives in: House/apartment Stairs: Yes: Internal: 13 steps; on left going up and External: 1 steps; none Has following equipment at home: Single point cane, Walker - 2 wheeled, and Wheelchair (manual)  OCCUPATION: retired  PLOF: Independent  PATIENT GOALS: to be able to walk her hour per day  NEXT MD VISIT: none scheduled  OBJECTIVE:  Note: Objective measures were completed at Evaluation unless otherwise noted.  DIAGNOSTIC FINDINGS: Scheduled  PATIENT SURVEYS:  LEFS 54 / 80 = 67.5 %  LEFS 01/13/24 46/80   LEFS 01/27/24: 60/80   COGNITION: Overall cognitive status: Within functional limits for tasks assessed     SENSATION: WFL  EDEMA:  Patient reports it swells intermittently  MUSCLE LENGTH: Marked quad tightness and gastroc B  POSTURE: rounded shoulders, forward head, and flexed trunk  genu valgus B  PALPATION: Lateral right knee  LOWER EXTREMITY ROM:  A/P ROM Right eval Left eval R/L 01/13/24   R 01/27/24  Knee flexion 99/108 74/80 A: 100  / 92  AA: 103 deg  Knee extension 0 0     (Blank rows = not tested)  LOWER EXTREMITY MMT:  MMT Right eval Left eval R/L 01/13/24    Hip flexion 4- 4- 4 / 4+  Hip extension 5 5   Hip abduction 4- 4-   Hip adduction 5 5   Hip internal rotation     Hip external rotation     Knee flexion 4- 5 4+ / 5   Knee extension 4+ 5 4+ / 5  Ankle dorsiflexion 5 5    (Blank rows = not tested)   FUNCTIONAL TESTS:  5 times sit to stand: 14.17 sec Stairs - eccentric weakness R, hyperextends right with ascending  01/13/24: - 5xSTS 11.14sec no UE support  - stair assessment in clinic; able to perform reciprocally leading with either LE, unilateral UE support, altered mechanics when leading R ascending, leading L descending  01/27/24: - 5xSTS: 11.54sec no UE first set, 10.37sec second set  GAIT: Distance walked: 20 Assistive device utilized: None Level of assistance: Complete Independence Comments: genu valgus B   [facility] Adult PT Treatment:                                                DATE: 01/27/24 Therapeutic Exercise: Red band hip 3 way 2x5 BIL w/ UE support SLR x12 BIL Heel/toe raises 2x12 HEP update + education/handout  Therapeutic Activity: Lateral stepping 3 laps Fwd/retro stepping 3 laps Mini squat w/ UE support 2x8 cues for BOS and symmetrical WB 5xSTS + education (2 attempts) Education/discussion re: progress w/ PT thus far, symptom behavior, walking program within pt tolerance    [facility] Adult PT Treatment:                                                DATE: 01/22/2024 Therapeutic Exercise: Resisted side stepping + GTB crossed at ankles  Resisted diagonal stepping +  GTB crossed at ankles --> fwd & bkwd Mini squat + side step out/in + blue --> black TB above knees Standing HS curls + GTB 2x12 (B) Seated knee extension + GTB x10 --> slow pace 5x5" Therapeutic Activity: Walking fwd/bkwd for stride length  Side marching + same knee taps --> stride length and dynamic  balance 4" step fwd shifting for quad control and activation    [facility] Adult PT Treatment:                                                DATE: 01/20/24 Therapeutic Exercise: GTB lateral stepping 6 laps at counter cues  for mechanics Green band hip ext 45 deg 2x8 BIL  Green band hamstring curl, seated 2x10 BIL Therapeutic Activity: Mini squat BIL UE support x12, x15 cues for mechanics/pacing 4 inch step up fwd, 4x8 cues for mechanics, mitigating quad avoidance SLS 2bouts to fatigue BIL                                                                                                                               PATIENT EDUCATION:  Education details: rationale for interventions, HEP  Person educated: Patient Education method: Explanation, Demonstration, Tactile cues, Verbal cues Education comprehension: verbalized understanding, returned demonstration, verbal cues required, tactile cues required, and needs further education     HOME EXERCISE PROGRAM: Access Code: [email or web address] URL: [email or web address] Date: 01/27/2024 Prepared by: Mayme Spearman  Exercises - Prone Quadriceps Stretch with Strap  - 1 x daily - 7 x weekly - 1 sets - 3-5 reps - 10-30 sec hold - Supine Active Straight Leg Raise  - 3-4 x weekly - 2-3 sets - 12 reps - Standing 3-Way Leg Reach with Resistance at Ankles and Counter Support  - 3-4 x weekly - 2-3 sets - 5 reps - Mini Squat with Counter Support  - 3-4 x weekly - 2-3 sets - 6-8 reps - Heel Toe Raises with Counter Support  - 3-4 x weekly - 2-3 sets - 12 reps  ASSESSMENT:  CLINICAL IMPRESSION: 01/27/2024 Pt arrives w/ mild increase in pain she attributes to increase in activities but overall states she is progressing well. In discussion w/ pt she states she feels confident managing symptoms with activity/exercise at end of remaining visits, today focusing on promoting increased self efficacy with familiar exercises, discussing strategies for improved self  management. Reports resolving pain as session goes on. No adverse events, tolerates well. Pt departs today's session in no acute distress, all voiced questions/concerns addressed appropriately from PT perspective.    EVAL: Patient is a 73 y.o. female who was seen today for physical therapy evaluation and treatment for R knee pain. She has had injections in the past, which have helped, but she is no longer eligible due to low platelets. She has pain primarily with walking. She demonstrates B LE weakness, ROM and flexibility deficits and functional weakness with sit to stands and stairs. She will benefit from skilled PT to address these deficits.     OBJECTIVE IMPAIRMENTS: decreased activity tolerance, difficulty walking, decreased ROM, decreased strength, increased edema, impaired flexibility, postural dysfunction, and pain.   ACTIVITY LIMITATIONS: lifting, squatting, stairs, transfers, bed mobility, and locomotion level  PARTICIPATION LIMITATIONS: community activity  PERSONAL FACTORS: Age, Fitness, Time since onset of injury/illness/exacerbation, and 1 comorbidity: OA  are also affecting patient's functional outcome.   REHAB POTENTIAL: Good  CLINICAL DECISION MAKING: Evolving/moderate complexity  EVALUATION COMPLEXITY: Low   SHORT TERM GOALS: Target date: 12/29/2023    Ind with initial HEP Baseline: 12/30/23: reports good HEP adherence Goal status: MET  2.  Decreased knee pain by 25%. Baseline:  01/13/24: 70% improvement per pt ; no more than 5/10 in past week Goal status: MET    LONG TERM GOALS: Target date: 02/02/2024   Ind with advanced HEP and its progression Baseline:  01/13/24: good HEP adherence reported Goal status: ONGOING   2. Improved R knee ROM to 0-105 deg to normalize gait and functional mobility Baseline:  01/13/24: see ROM chart above Goal status: ONGOING  3.  Improved LE strength to 5/5 to improve function Baseline:  01/13/24: see MMT chart above Goal status:  PROGRESSING   4.  Able to ascend stairs with a reciprocal gait pattern and one UE  Baseline:  01/13/24: able to perform with altered kinematics, pain Goal status: PARTIALLY MET   5.  Decreased knee pain in the by >=75% with ADLs. Baseline:  01/13/24: no pain with ADLs typically Goal status: MET   6.  Improved LEFS  to 63 showing functional improvement Baseline: 54 / 80 = 67.5 % 01/13/24: 46 01/27/24: 60/80  Goal status: PROGRESSING    PLAN:  PT FREQUENCY: 2x/week  PT DURATION: 8 weeks  PLANNED INTERVENTIONS: 97164- PT Re-evaluation, 97110-Therapeutic exercises, 97530- Therapeutic activity, 97112- Neuromuscular re-education, 97535- Self Care, 16109- Manual therapy, [id number]- Gait training, [id number]- Aquatic Therapy, 97014- Electrical stimulation (unattended), 97016- Vasopneumatic device, 97033- Ionotophoresis 4mg /ml Dexamethasone , Patient/Family education, Balance training, Stair training, Taping, Dry Needling, Joint mobilization, Cryotherapy, and Moist heat  PLAN FOR NEXT SESSION: consider extension of POC vs d/c next visit, update LTG accordingly.    Lovett Ruck PT, DPT 01/27/2024 2:45 PM

## 2024-01-27 ENCOUNTER — Encounter: Payer: Self-pay | Admitting: Physical Therapy

## 2024-01-27 ENCOUNTER — Ambulatory Visit: Payer: Medicare Other | Admitting: Physical Therapy

## 2024-01-27 DIAGNOSIS — G8929 Other chronic pain: Secondary | ICD-10-CM | POA: Diagnosis not present

## 2024-01-27 DIAGNOSIS — M25661 Stiffness of right knee, not elsewhere classified: Secondary | ICD-10-CM | POA: Diagnosis not present

## 2024-01-27 DIAGNOSIS — M6281 Muscle weakness (generalized): Secondary | ICD-10-CM | POA: Diagnosis not present

## 2024-01-27 DIAGNOSIS — M25561 Pain in right knee: Secondary | ICD-10-CM | POA: Diagnosis not present

## 2024-01-29 ENCOUNTER — Ambulatory Visit: Payer: Medicare Other

## 2024-02-02 ENCOUNTER — Ambulatory Visit (INDEPENDENT_AMBULATORY_CARE_PROVIDER_SITE_OTHER): Payer: Medicare Other | Admitting: Family Medicine

## 2024-02-02 ENCOUNTER — Encounter: Payer: Self-pay | Admitting: Family Medicine

## 2024-02-02 VITALS — BP 126/68 | HR 65 | Ht 63.0 in | Wt 192.0 lb

## 2024-02-02 DIAGNOSIS — F5101 Primary insomnia: Secondary | ICD-10-CM

## 2024-02-02 DIAGNOSIS — M7130 Other bursal cyst, unspecified site: Secondary | ICD-10-CM | POA: Diagnosis not present

## 2024-02-02 DIAGNOSIS — I480 Paroxysmal atrial fibrillation: Secondary | ICD-10-CM

## 2024-02-02 DIAGNOSIS — G4733 Obstructive sleep apnea (adult) (pediatric): Secondary | ICD-10-CM

## 2024-02-02 DIAGNOSIS — I10 Essential (primary) hypertension: Secondary | ICD-10-CM

## 2024-02-02 DIAGNOSIS — G4700 Insomnia, unspecified: Secondary | ICD-10-CM | POA: Insufficient documentation

## 2024-02-02 NOTE — Assessment & Plan Note (Addendum)
 Wearing CPAP for 5-6 hours nightly.  Getting good benefit from CPAP.

## 2024-02-02 NOTE — Assessment & Plan Note (Signed)
Well controlled. Continue current regimen. Follow up in  6 mo . Labs are up to date.  

## 2024-02-02 NOTE — Assessment & Plan Note (Signed)
 Unfortunately is still having A-fib.  She has not had any events since 2023 and then all of a sudden a week or so ago her heart monitor picked up that her pulse was around 185 around 10:00 at night.  Her baseline pulse at rest is normally in the 40s.  She does have a Watchman device in place and they have also discussed potentially looking at a pacemaker for her as well.

## 2024-02-02 NOTE — Progress Notes (Signed)
   Established Patient Office Visit  Subjective  Patient ID: Margaret Smith, female    DOB: 12-Aug-1951  Age: 73 y.o. MRN: [id number]  Chief Complaint  Patient presents with   Hypertension    HPI  Hypertension- Pt denies chest pain, SOB, dizziness, or heart palpitations.  Taking meds as directed w/o problems.  Denies medication side effects.    She also has a cyst on her PIP on her fourth finger on her right hand.  She does not member any trauma or injury she said it was pretty sore when she first noticed it.  The soreness seems to have gotten a lot better she thinks maybe it is a little smaller.       ROS    Objective:     BP 126/68   Pulse 65   Ht 5\' 3"  (1.6 m)   Wt 192 lb (87.1 kg)   SpO2 100%   BMI 34.01 kg/m    Physical Exam Vitals and nursing note reviewed.  Constitutional:      Appearance: Normal appearance.  HENT:     Head: Normocephalic and atraumatic.  Eyes:     Conjunctiva/sclera: Conjunctivae normal.  Cardiovascular:     Rate and Rhythm: Normal rate and regular rhythm.  Pulmonary:     Effort: Pulmonary effort is normal.     Breath sounds: Normal breath sounds.  Skin:    General: Skin is warm and dry.  Neurological:     Mental Status: She is alert.  Psychiatric:        Mood and Affect: Mood normal.      No results found for any visits on 02/02/24.    The 10-year ASCVD risk score (Arnett DK, et al., 2019) is: 10.5%    Assessment & Plan:   Problem List Items Addressed This Visit       Cardiovascular and Mediastinum   Primary hypertension - Primary   Well controlled. Continue current regimen. Follow up in  72mo. Labs are up todate.       Paroxysmal A-fib (HCC)   Unfortunately is still having A-fib.  She has not had any events since 2023 and then all of a sudden a week or so ago her heart monitor picked up that her pulse was around 185 around 10:00 at night.  Her baseline pulse at rest is normally in the 40s.  She does have a Watchman  device in place and they have also discussed potentially looking at a pacemaker for her as well.        Respiratory   OSA on CPAP   Wearing CPAP for 5-6 hours nightly.  Getting good benefit from CPAP.         Other   Insomnia   Takes trazodone nightly.       Other Visit Diagnoses       Synovial cyst          Synovial cyst-for now since the pain is better with encouraged her just to monitor and see if it continues to go down the next couple of months.  But if not please let me know.  We can always get her scheduled with our sports med doc to have it drained.  Return in about 6 months (around 08/01/2024) for Hypertension.    Nani Gasser, MD

## 2024-02-02 NOTE — Assessment & Plan Note (Signed)
 Takes trazodone nightly

## 2024-02-08 ENCOUNTER — Other Ambulatory Visit: Payer: Self-pay | Admitting: Family Medicine

## 2024-02-08 DIAGNOSIS — E785 Hyperlipidemia, unspecified: Secondary | ICD-10-CM

## 2024-02-12 ENCOUNTER — Other Ambulatory Visit: Payer: Self-pay | Admitting: Family Medicine

## 2024-02-12 ENCOUNTER — Other Ambulatory Visit: Payer: Self-pay | Admitting: *Deleted

## 2024-02-12 DIAGNOSIS — E785 Hyperlipidemia, unspecified: Secondary | ICD-10-CM

## 2024-02-12 NOTE — Telephone Encounter (Signed)
 Copied from CRM [id number]. Topic: Clinical - Medication Refill >> Feb 12, 2024  9:41 AM Eunice Blase wrote: Most Recent Primary Care Visit:  Provider: Nani Gasser D  Department: [facility]  Visit Type: OFFICE VISIT  Date: 02/02/2024  Medication: rosuvastatin (CRESTOR) 10 MG tablet  Has the patient contacted their pharmacy? Yes (Agent: If no, request that the patient contact the pharmacy for the refill. If patient does not wish to contact the pharmacy document the reason why and proceed with request.) (Agent: If yes, when and what did the pharmacy advise?)Pharmacy needs approval for refill  Is this the correct pharmacy for this prescription? Yes If no, delete pharmacy and type the correct one.  This is the patient's preferred pharmacy:  [phone] - [place], [place] - [address] [place] [address] Phone: [phone] Fax: [phone]   Has the prescription been filled recently? Yes  Is the patient out of the medication? Yes  Has the patient been seen for an appointment in the last year OR does the patient have an upcoming appointment? Yes  Can we respond through MyChart? Yes  Agent: Please be advised that Rx refills may take up to 3 business days. We ask that you follow-up with your pharmacy.

## 2024-02-13 ENCOUNTER — Other Ambulatory Visit: Payer: Self-pay | Admitting: Family Medicine

## 2024-02-13 DIAGNOSIS — I10 Essential (primary) hypertension: Secondary | ICD-10-CM

## 2024-02-16 DIAGNOSIS — D696 Thrombocytopenia, unspecified: Secondary | ICD-10-CM | POA: Diagnosis not present

## 2024-02-16 DIAGNOSIS — R0609 Other forms of dyspnea: Secondary | ICD-10-CM | POA: Diagnosis not present

## 2024-02-16 DIAGNOSIS — I480 Paroxysmal atrial fibrillation: Secondary | ICD-10-CM | POA: Diagnosis not present

## 2024-02-16 DIAGNOSIS — I10 Essential (primary) hypertension: Secondary | ICD-10-CM | POA: Diagnosis not present

## 2024-02-16 DIAGNOSIS — R635 Abnormal weight gain: Secondary | ICD-10-CM | POA: Diagnosis not present

## 2024-02-17 ENCOUNTER — Other Ambulatory Visit: Payer: Self-pay

## 2024-02-17 ENCOUNTER — Inpatient Hospital Stay (HOSPITAL_BASED_OUTPATIENT_CLINIC_OR_DEPARTMENT_OTHER): Payer: Medicare Other | Admitting: Hematology & Oncology

## 2024-02-17 ENCOUNTER — Inpatient Hospital Stay: Payer: Medicare Other | Attending: Hematology & Oncology

## 2024-02-17 ENCOUNTER — Encounter: Payer: Self-pay | Admitting: Hematology & Oncology

## 2024-02-17 VITALS — BP 143/49 | HR 53 | Temp 97.9°F | Resp 16 | Ht 63.0 in | Wt 191.0 lb

## 2024-02-17 DIAGNOSIS — I4891 Unspecified atrial fibrillation: Secondary | ICD-10-CM | POA: Diagnosis not present

## 2024-02-17 DIAGNOSIS — D696 Thrombocytopenia, unspecified: Secondary | ICD-10-CM

## 2024-02-17 DIAGNOSIS — Z7902 Long term (current) use of antithrombotics/antiplatelets: Secondary | ICD-10-CM | POA: Insufficient documentation

## 2024-02-17 DIAGNOSIS — Z7901 Long term (current) use of anticoagulants: Secondary | ICD-10-CM | POA: Diagnosis not present

## 2024-02-17 LAB — CBC WITH DIFFERENTIAL (CANCER CENTER ONLY)
Abs Immature Granulocytes: 0.01 10*3/uL (ref 0.00–0.07)
Basophils Absolute: 0 10*3/uL (ref 0.0–0.1)
Basophils Relative: 1 %
Eosinophils Absolute: 0.1 10*3/uL (ref 0.0–0.5)
Eosinophils Relative: 1 %
HCT: 39.9 % (ref 36.0–46.0)
Hemoglobin: 12.9 g/dL (ref 12.0–15.0)
Immature Granulocytes: 0 %
Lymphocytes Relative: 37 %
Lymphs Abs: 1.8 10*3/uL (ref 0.7–4.0)
MCH: 29.3 pg (ref 26.0–34.0)
MCHC: 32.3 g/dL (ref 30.0–36.0)
MCV: 90.5 fL (ref 80.0–100.0)
Monocytes Absolute: 0.6 10*3/uL (ref 0.1–1.0)
Monocytes Relative: 11 %
Neutro Abs: 2.4 10*3/uL (ref 1.7–7.7)
Neutrophils Relative %: 50 %
Platelet Count: 91 10*3/uL — ABNORMAL LOW (ref 150–400)
RBC: 4.41 MIL/uL (ref 3.87–5.11)
RDW: 14.2 % (ref 11.5–15.5)
WBC Count: 4.9 10*3/uL (ref 4.0–10.5)
nRBC: 0 % (ref 0.0–0.2)

## 2024-02-17 LAB — CMP (CANCER CENTER ONLY)
ALT: 15 U/L (ref 0–44)
AST: 24 U/L (ref 15–41)
Albumin: 4.4 g/dL (ref 3.5–5.0)
Alkaline Phosphatase: 66 U/L (ref 38–126)
Anion gap: 7 (ref 5–15)
BUN: 19 mg/dL (ref 8–23)
CO2: 29 mmol/L (ref 22–32)
Calcium: 9.5 mg/dL (ref 8.9–10.3)
Chloride: 102 mmol/L (ref 98–111)
Creatinine: 0.8 mg/dL (ref 0.44–1.00)
GFR, Estimated: >60 mL/min (ref >60)
Glucose, Bld: 61 mg/dL — ABNORMAL LOW (ref 70–99)
Potassium: 4.8 mmol/L (ref 3.5–5.1)
Sodium: 138 mmol/L (ref 135–145)
Total Bilirubin: 0.6 mg/dL (ref 0.0–1.2)
Total Protein: 7.2 g/dL (ref 6.5–8.1)

## 2024-02-17 LAB — SAVE SMEAR(SSMR), FOR PROVIDER SLIDE REVIEW

## 2024-02-17 NOTE — Progress Notes (Signed)
 Hematology and Oncology Follow Up Visit  Margaret Smith [id number] February 20, 1951 73 y.o. 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 twice daily --DC secondary to Watchman procedure.     Interim History:  Margaret Smith is back for her follow-up.  She is doing quite well.  She is now off Plavix.  Back in February, she had little bit of blood in her mouth.  She did go to the emergency room.  Her platelet count was 88,000.  At that level, I would find it hard to believe that she would have any bleeding.  However, at that time, she was on Plavix.  She has had no problems with cough or shortness of breath.  There has been no change in bowel or bladder habits.  She has had no leg swelling.  She does wear compression socks.  She has had no issues with fever.  She has had no rashes.  Overall, she is avoided COVID.  Currently, I would have to say that her performance status about ECOG 1.    Medications:  Current Outpatient Medications:    pantoprazole (PROTONIX) 40 MG tablet, Take 40 mg by mouth daily., Disp: , Rfl:    acetaminophen (TYLENOL 8 HOUR ARTHRITIS PAIN) 650 MG CR tablet, Take 650 mg by mouth every 8 (eight) hours as needed for pain., Disp: , Rfl:    aspirin EC 81 MG tablet, Take 81 mg by mouth daily. Swallow whole., Disp: , Rfl:    calcium carbonate (OSCAL) 1500 (600 Ca) MG TABS tablet, Take 600 mg of elemental calcium by mouth daily with breakfast., Disp: , Rfl:    losartan (COZAAR) 25 MG tablet, TAKE ONE TABLET ONCE DAILY, Disp: 90 tablet, Rfl: 1   pramipexole (MIRAPEX) 0.5 MG tablet, Take 0.5 mg by mouth at bedtime., Disp: , Rfl:    rosuvastatin (CRESTOR) 10 MG tablet, TAKE 1 TABLET BY MOUTH EVERY DAY AT BEDTIME, Disp: 30 tablet, Rfl: 1   traZODone (DESYREL) 50 MG tablet, TAKE 1/2 TO 1 TABLET BY MOUTH AT BEDTIME AS NEEDED FOR SLEEP, Disp: 30 tablet, Rfl: 3  Allergies: No Known  Allergies  Past Medical History, Surgical history, Social history, and Family History were reviewed and updated.  Review of Systems: Review of Systems  Constitutional: Negative.   HENT:  Negative.    Eyes: Negative.   Respiratory: Negative.    Cardiovascular: Negative.   Gastrointestinal: Negative.   Endocrine: Negative.   Genitourinary: Negative.    Musculoskeletal: Negative.   Skin: Negative.   Neurological: Negative.   Hematological: Negative.   Psychiatric/Behavioral: Negative.      Physical Exam:  Temperature is 97.9.  Pulse 53.  Blood pressure 143/49.  Weight is 191 pounds.  Wt Readings from Last 3 Encounters:  02/02/24 192 lb (87.1 kg)  01/20/24 192 lb (87.1 kg)  10/28/23 186 lb 8 oz (84.6 kg)    Physical Exam Vitals reviewed.  HENT:     Head: Normocephalic and atraumatic.  Eyes:     Pupils: Pupils are equal, round, and reactive to light.  Cardiovascular:     Rate and Rhythm: Normal rate and regular rhythm.     Heart sounds: Normal heart sounds.  Pulmonary:     Effort: Pulmonary effort is normal.     Breath sounds: Normal breath sounds.  Abdominal:     General: Bowel sounds are normal.     Palpations: Abdomen is soft.  Musculoskeletal:  General: No tenderness or deformity. Normal range of motion.     Cervical back: Normal range of motion.  Lymphadenopathy:     Cervical: No cervical adenopathy.  Skin:    General: Skin is warm and dry.     Findings: No erythema or rash.  Neurological:     Mental Status: She is alert and oriented to person, place, and time.  Psychiatric:        Behavior: Behavior normal.        Thought Content: Thought content normal.        Judgment: Judgment normal.     Lab Results  Component Value Date   WBC 4.9 02/17/2024   HGB 12.9 02/17/2024   HCT 39.9 02/17/2024   MCV 90.5 02/17/2024   PLT 91 (L) 02/17/2024     Chemistry      Component Value Date/Time   NA 141 10/20/2023 1022   K 4.3 10/20/2023 1022   CL 103  10/20/2023 1022   CO2 30 10/20/2023 1022   BUN 18 10/20/2023 1022   CREATININE 0.80 10/20/2023 1022   CREATININE 0.70 10/24/2022 0000   GLU 85 04/12/2015 0000      Component Value Date/Time   CALCIUM 9.8 10/20/2023 1022   ALKPHOS 74 10/20/2023 1022   AST 21 10/20/2023 1022   ALT 13 10/20/2023 1022   BILITOT 0.4 10/20/2023 1022      Impression and Plan: Margaret Smith is a very nice 73 year old Afro-American female.  She is quite funny.  She used to work for the [employer].  Her platelet count is on the lower side.  However, in 1 year, the platelet count really has not changed.  She now is off anticoagulation and Plavix.  I would think that her risk of bleeding should be incredibly low.  We will now plan to get her back sometime this summer.  I think that we are doing okay.  I am happy that she is feeling well.  She is looking forward to the Spring.   Josph Macho, MD 3/11/202512:03 PM

## 2024-03-08 ENCOUNTER — Other Ambulatory Visit: Payer: Self-pay | Admitting: Family Medicine

## 2024-03-08 DIAGNOSIS — K219 Gastro-esophageal reflux disease without esophagitis: Secondary | ICD-10-CM

## 2024-03-08 DIAGNOSIS — I480 Paroxysmal atrial fibrillation: Secondary | ICD-10-CM | POA: Diagnosis not present

## 2024-04-07 ENCOUNTER — Other Ambulatory Visit: Payer: Self-pay | Admitting: Family Medicine

## 2024-04-07 DIAGNOSIS — E785 Hyperlipidemia, unspecified: Secondary | ICD-10-CM

## 2024-05-06 ENCOUNTER — Other Ambulatory Visit: Payer: Self-pay | Admitting: Family Medicine

## 2024-05-06 DIAGNOSIS — K219 Gastro-esophageal reflux disease without esophagitis: Secondary | ICD-10-CM

## 2024-05-09 ENCOUNTER — Other Ambulatory Visit: Payer: Self-pay

## 2024-05-09 ENCOUNTER — Ambulatory Visit
Admission: EM | Admit: 2024-05-09 | Discharge: 2024-05-09 | Disposition: A | Discharge status: Home / Self Care | Attending: Family Medicine | Admitting: Family Medicine

## 2024-05-09 DIAGNOSIS — S61401A Unspecified open wound of right hand, initial encounter: Secondary | ICD-10-CM | POA: Diagnosis not present

## 2024-05-09 MED ORDER — CEPHALEXIN 500 MG PO CAPS: 500.0000 mg | ORAL_CAPSULE | Freq: Two times a day (BID) | ORAL | 0 refills | Status: AC

## 2024-05-09 NOTE — ED Provider Notes (Signed)
 [facility]    CSN: [id number] Arrival date & time: 05/09/24  1418      History   Chief Complaint Chief Complaint  Patient presents with   right hand injury     HPI Margaret Smith is a 73 y.o. female.   HPI pleasant 73 year old female presents with right hand skin avulsion while cleaning windows inside her house earlier this morning.  PMH significant for HTN and thrombocytopenia.  Past Medical History:  Diagnosis Date   Hypertension    Pneumonia    Thrombocytopenia (HCC) 11/15/2020    Patient Active Problem List   Diagnosis Date Noted   Insomnia 02/02/2024   Encounter for screening for osteoporosis 10/28/2023   Osteoporosis 10/28/2023   Mitral regurgitation 01/24/2023   Tricuspid regurgitation 01/24/2023   OSA on CPAP 11/15/2022   Paroxysmal A-fib (HCC) 11/12/2022   Primary hypertension 09/17/2022   Thrombocytopenia (HCC) 11/15/2020   White coat syndrome without diagnosis of hypertension 01/21/2017   Bradycardia 12/28/2015   Thyromegaly 12/28/2015   Primary osteoarthritis of right knee, post left knee arthroplasty 06/26/2015    Past Surgical History:  Procedure Laterality Date   CARPAL TUNNEL RELEASE  1980s   bilateral.    LIPOMA EXCISION     left forearm.    REPLACEMENT TOTAL KNEE Left    Dr. Chaneta Comer    OB History   No obstetric history on file.      Home Medications    Prior to Admission medications   Medication Sig Start Date End Date Taking? Authorizing Provider  cephALEXin (KEFLEX) 500 MG capsule Take 1 capsule (500 mg total) by mouth 2 (two) times daily for 7 days. 05/09/24 05/16/24 Yes Leonides Ramp, FNP  acetaminophen (TYLENOL 8 HOUR ARTHRITIS PAIN) 650 MG CR tablet Take 650 mg by mouth every 8 (eight) hours as needed for pain. 02/21/22   [provider]  aspirin  EC 81 MG tablet Take 81 mg by mouth daily. Swallow whole.    Cydney Draft, MD  calcium  carbonate (OSCAL) 1500 (600 Ca) MG TABS tablet Take 600 mg of elemental  calcium  by mouth daily with breakfast.    [provider]  losartan  (COZAAR ) 25 MG tablet TAKE ONE TABLET ONCE DAILY 02/13/24   Cydney Draft, MD  pantoprazole  (PROTONIX ) 40 MG tablet TAKE 1 TABLET BY MOUTH EVERY DAY 05/06/24   Cydney Draft, MD  pramipexole (MIRAPEX) 0.5 MG tablet Take 0.5 mg by mouth at bedtime. 04/11/23   [provider]  rosuvastatin  (CRESTOR ) 10 MG tablet TAKE 1 TABLET BY MOUTH EVERY DAY AT BEDTIME 04/07/24   Cydney Draft, MD  traZODone  (DESYREL ) 50 MG tablet TAKE 1/2 TO 1 TABLET BY MOUTH AT BEDTIME AS NEEDED FOR SLEEP 05/06/24   Cydney Draft, MD    Family History Family History  Problem Relation Age of Onset   Hypertension Mother    Hyperlipidemia Mother    Diabetes Mother    Heart attack Mother    Stroke Mother    Seizures Mother    Hypertension Father    Diabetes Father    Pancreatic cancer Father    Hypertension Sister    Kidney disease Sister    Hypertension Brother    Breast cancer Paternal Aunt    Diabetes Maternal Grandfather     Social History Social History   Tobacco Use   Smoking status: Former    Current packs/day: 0.00    Average packs/day: 0.3 packs/day for 15.0 years (3.8 ttl  pk-yrs)    Types: Cigarettes    Start date: 04/08/1989    Quit date: 04/08/2004    Years since quitting: 20.0   Smokeless tobacco: Never  Vaping Use   Vaping status: Never Used  Substance Use Topics   Alcohol use: No   Drug use: No     Allergies   Patient has no known allergies.   Review of Systems Review of Systems   Physical Exam Triage Vital Signs ED Triage Vitals  Encounter Vitals Group     BP 05/09/24 1447 134/65     Systolic BP Percentile --      Diastolic BP Percentile --      Pulse Rate 05/09/24 1447 60     Resp 05/09/24 1447 14     Temp 05/09/24 1447 98.4 F (36.9 C)     Temp src --      SpO2 05/09/24 1447 98 %     Weight --      Height --      Head Circumference --      Peak Flow --       Pain Score 05/09/24 1448 0     Pain Loc --      Pain Education --      Exclude from Growth Chart --    No data found.  Updated Vital Signs BP 134/65 (BP Location: Right Arm)   Pulse 60   Temp 98.4 F (36.9 C)   Resp 14   SpO2 98%   Visual Acuity Right Eye Distance:   Left Eye Distance:   Bilateral Distance:    Right Eye Near:   Left Eye Near:    Bilateral Near:     Physical Exam Vitals and nursing note reviewed.  Constitutional:      Appearance: Normal appearance. She is normal weight.  HENT:     Head: Normocephalic and atraumatic.     Mouth/Throat:     Mouth: Mucous membranes are moist.     Pharynx: Oropharynx is clear.  Eyes:     Extraocular Movements: Extraocular movements intact.     Conjunctiva/sclera: Conjunctivae normal.     Pupils: Pupils are equal, round, and reactive to light.  Cardiovascular:     Rate and Rhythm: Normal rate and regular rhythm.     Pulses: Normal pulses.     Heart sounds: Normal heart sounds.  Pulmonary:     Effort: Pulmonary effort is normal.     Breath sounds: Normal breath sounds. No wheezing, rhonchi or rales.  Musculoskeletal:        General: Normal range of motion.     Cervical back: Normal range of motion and neck supple.  Skin:    General: Skin is warm and dry.     Comments: Right hand (lateral aspect of first metacarpal head) ~1.0 cm  linear skin avulsion noted please see image below  Neurological:     General: No focal deficit present.     Mental Status: She is alert and oriented to person, place, and time. Mental status is at baseline.  Psychiatric:        Mood and Affect: Mood normal.        Behavior: Behavior normal.      UC Treatments / Results  Labs (all labs ordered are listed, but only abnormal results are displayed) Labs Reviewed - No data to display  EKG   Radiology No results found.  Procedures Procedures (including critical care time)  Medications Ordered in UC  Medications - No data to  display  Initial Impression / Assessment and Plan / UC Course  I have reviewed the triage vital signs and the nursing notes.  Pertinent labs & imaging results that were available during my care of the patient were reviewed by me and considered in my medical decision making (see chart for details).     MDM: 1.  Avulsion of skin of right hand, initial encounter-half-inch x 2 inch 58M Steri-Strip used to approximate skin edges benzoin tincture used to anchor Steri-Strip to skin, Rx'd Keflex 500 mg capsule: Take 1 capsule twice daily x 7 days. Instructed patient to remove outer wound bandage in 24 hours.  Advised Steri-Strip will come off on its own in the next 5 days.  Advised patient to take medication as directed with food to completion.  Encouraged to increase daily water intake to 64 ounces per day while taking this medication.  Advised if symptoms worsen and/or unresolved please follow-up with your PCP or here for further evaluation.  Patient discharged home, hemodynamically stable. Final Clinical Impressions(s) / UC Diagnoses   Final diagnoses:  Avulsion of skin of right hand, initial encounter     Discharge Instructions      Instructed patient to remove outer wound bandage in 24 hours.  Advised Steri-Strip will come off on its own in the next 5 days.  Advised patient to take medication as directed with food to completion.  Encouraged to increase daily water intake to 64 ounces per day while taking this medication.  Advised if symptoms worsen and/or unresolved please follow-up with your PCP or here for further evaluation.   ED Prescriptions     Medication Sig Dispense Auth. Provider   cephALEXin (KEFLEX) 500 MG capsule Take 1 capsule (500 mg total) by mouth 2 (two) times daily for 7 days. 14 capsule Mady Oubre, FNP      PDMP not reviewed this encounter.   Leonides Ramp, FNP 05/09/24 [phone]

## 2024-05-09 NOTE — ED Triage Notes (Signed)
 Pt presents to uc with co lac to right hand around 11 am this morning. Pt reports she was trying to clean the windows inside her house when a window didn't get propped opened well enough and slammed on her hand.

## 2024-05-09 NOTE — Discharge Instructions (Addendum)
 Instructed patient to remove outer wound bandage in 24 hours.  Advised Steri-Strip will come off on its own in the next 5 days.  Advised patient to take medication as directed with food to completion.  Encouraged to increase daily water intake to 64 ounces per day while taking this medication.  Advised if symptoms worsen and/or unresolved please follow-up with your PCP or here for further evaluation.

## 2024-05-25 ENCOUNTER — Encounter: Payer: Self-pay | Admitting: Medical Oncology

## 2024-05-25 ENCOUNTER — Inpatient Hospital Stay (HOSPITAL_BASED_OUTPATIENT_CLINIC_OR_DEPARTMENT_OTHER): Admitting: Medical Oncology

## 2024-05-25 ENCOUNTER — Inpatient Hospital Stay: Attending: Hematology & Oncology

## 2024-05-25 VITALS — BP 145/45 | HR 60 | Temp 98.4°F | Resp 18 | Ht 63.0 in | Wt 198.0 lb

## 2024-05-25 DIAGNOSIS — D696 Thrombocytopenia, unspecified: Secondary | ICD-10-CM | POA: Diagnosis not present

## 2024-05-25 DIAGNOSIS — Z7901 Long term (current) use of anticoagulants: Secondary | ICD-10-CM | POA: Diagnosis not present

## 2024-05-25 DIAGNOSIS — I4891 Unspecified atrial fibrillation: Secondary | ICD-10-CM | POA: Insufficient documentation

## 2024-05-25 LAB — CMP (CANCER CENTER ONLY)
ALT: 16 U/L (ref 0–44)
AST: 27 U/L (ref 15–41)
Albumin: 4.3 g/dL (ref 3.5–5.0)
Alkaline Phosphatase: 69 U/L (ref 38–126)
Anion gap: 6 (ref 5–15)
BUN: 22 mg/dL (ref 8–23)
CO2: 29 mmol/L (ref 22–32)
Calcium: 9.7 mg/dL (ref 8.9–10.3)
Chloride: 105 mmol/L (ref 98–111)
Creatinine: 0.8 mg/dL (ref 0.44–1.00)
GFR, Estimated: >60 mL/min (ref >60)
Glucose, Bld: 94 mg/dL (ref 70–99)
Potassium: 5 mmol/L (ref 3.5–5.1)
Sodium: 140 mmol/L (ref 135–145)
Total Bilirubin: 0.5 mg/dL (ref 0.0–1.2)
Total Protein: 6.7 g/dL (ref 6.5–8.1)

## 2024-05-25 LAB — CBC WITH DIFFERENTIAL (CANCER CENTER ONLY)
Abs Immature Granulocytes: 0.02 10*3/uL (ref 0.00–0.07)
Basophils Absolute: 0 10*3/uL (ref 0.0–0.1)
Basophils Relative: 0 %
Eosinophils Absolute: 0 10*3/uL (ref 0.0–0.5)
Eosinophils Relative: 1 %
HCT: 38 % (ref 36.0–46.0)
Hemoglobin: 12.2 g/dL (ref 12.0–15.0)
Immature Granulocytes: 0 %
Lymphocytes Relative: 27 %
Lymphs Abs: 1.5 10*3/uL (ref 0.7–4.0)
MCH: 28.8 pg (ref 26.0–34.0)
MCHC: 32.1 g/dL (ref 30.0–36.0)
MCV: 89.6 fL (ref 80.0–100.0)
Monocytes Absolute: 0.6 10*3/uL (ref 0.1–1.0)
Monocytes Relative: 11 %
Neutro Abs: 3.5 10*3/uL (ref 1.7–7.7)
Neutrophils Relative %: 61 %
Platelet Count: 83 10*3/uL — ABNORMAL LOW (ref 150–400)
RBC: 4.24 MIL/uL (ref 3.87–5.11)
RDW: 14.5 % (ref 11.5–15.5)
WBC Count: 5.7 10*3/uL (ref 4.0–10.5)
nRBC: 0 % (ref 0.0–0.2)

## 2024-05-25 NOTE — Progress Notes (Signed)
 Hematology and Oncology Follow Up Visit  Margaret Smith [id number] 07/11/1951 73 y.o. 05/25/2024   Principle Diagnosis:  Thrombocytopenia-likely mild ITP Atrial fibrillation  Previous Therapy: Eliquis 5 mg p.o. twice daily --DC secondary to Watchman procedure.  Current Therapy:   Observation 81mg  asa     Interim History:  Margaret Smith is back for her follow-up.  Today she states that she is doing well. She has no concerns.   She has had no problems with cough or shortness of breath.  There has been no change in bowel or bladder habits.  She has had no leg swelling.  She does wear compression socks.  There has been no bleeding to her knowledge: denies epistaxis, gingivitis, hemoptysis, hematemesis, hematuria, melena, excessive bruising, blood donation.   She has had no issues with fever.  She has had no rashes.  No ETOH use  Currently, I would have to say that her performance status about ECOG 1.    Wt Readings from Last 3 Encounters:  05/25/24 198 lb (89.8 kg)  02/17/24 191 lb (86.6 kg)  02/02/24 192 lb (87.1 kg)     Medications:  Current Outpatient Medications:    acetaminophen (TYLENOL 8 HOUR ARTHRITIS PAIN) 650 MG CR tablet, Take 650 mg by mouth every 8 (eight) hours as needed for pain., Disp: , Rfl:    aspirin  EC 81 MG tablet, Take 81 mg by mouth daily. Swallow whole., Disp: , Rfl:    calcium  carbonate (OSCAL) 1500 (600 Ca) MG TABS tablet, Take 600 mg of elemental calcium  by mouth daily with breakfast., Disp: , Rfl:    losartan  (COZAAR ) 25 MG tablet, TAKE ONE TABLET ONCE DAILY, Disp: 90 tablet, Rfl: 1   pramipexole (MIRAPEX) 0.5 MG tablet, Take 0.5 mg by mouth at bedtime., Disp: , Rfl:    rosuvastatin  (CRESTOR ) 10 MG tablet, TAKE 1 TABLET BY MOUTH EVERY DAY AT BEDTIME, Disp: 90 tablet, Rfl: 3   traZODone  (DESYREL ) 50 MG tablet, TAKE 1/2 TO 1 TABLET BY MOUTH AT BEDTIME AS NEEDED FOR SLEEP, Disp: 30 tablet, Rfl: 3  Allergies: No Known Allergies  Past Medical  History, Surgical history, Social history, and Family History were reviewed and updated.  Review of Systems: Review of Systems  Constitutional: Negative.   HENT:  Negative.    Eyes: Negative.   Respiratory: Negative.    Cardiovascular: Negative.   Gastrointestinal: Negative.   Endocrine: Negative.   Genitourinary: Negative.    Musculoskeletal: Negative.   Skin: Negative.   Neurological: Negative.   Hematological: Negative.   Psychiatric/Behavioral: Negative.      Physical Exam: Vitals:   05/25/24 1215  BP: (!) 145/45  Pulse: 60  Resp: 18  Temp: 98.4 F (36.9 C)  SpO2: 100%     Wt Readings from Last 3 Encounters:  05/25/24 198 lb (89.8 kg)  02/17/24 191 lb (86.6 kg)  02/02/24 192 lb (87.1 kg)    Physical Exam Vitals reviewed.  HENT:     Head: Normocephalic and atraumatic.   Eyes:     Pupils: Pupils are equal, round, and reactive to light.    Cardiovascular:     Rate and Rhythm: Normal rate and regular rhythm.     Heart sounds: Normal heart sounds.  Pulmonary:     Effort: Pulmonary effort is normal.     Breath sounds: Normal breath sounds.  Abdominal:     General: Bowel sounds are normal.     Palpations: Abdomen is soft.   Musculoskeletal:  General: No tenderness or deformity. Normal range of motion.     Cervical back: Normal range of motion.  Lymphadenopathy:     Cervical: No cervical adenopathy.   Skin:    General: Skin is warm and dry.     Findings: No erythema or rash.   Neurological:     Mental Status: She is alert and oriented to person, place, and time.   Psychiatric:        Behavior: Behavior normal.        Thought Content: Thought content normal.        Judgment: Judgment normal.     Lab Results  Component Value Date   WBC 5.7 05/25/2024   HGB 12.2 05/25/2024   HCT 38.0 05/25/2024   MCV 89.6 05/25/2024   PLT 83 (L) 05/25/2024     Chemistry      Component Value Date/Time   NA 140 05/25/2024 1118   K 5.0 05/25/2024  1118   CL 105 05/25/2024 1118   CO2 29 05/25/2024 1118   BUN 22 05/25/2024 1118   CREATININE 0.80 05/25/2024 1118   CREATININE 0.70 10/24/2022 0000   GLU 85 04/12/2015 0000      Component Value Date/Time   CALCIUM  9.7 05/25/2024 1118   ALKPHOS 69 05/25/2024 1118   AST 27 05/25/2024 1118   ALT 16 05/25/2024 1118   BILITOT 0.5 05/25/2024 1118     Encounter Diagnosis  Name Primary?   Thrombocytopenia (HCC) Yes    Impression and Plan: Margaret Smith is a very nice 73 year old African-American female.  She is quite funny.  She used to work for the [employer].  Today her platelet count is 83. 3 months ago it was 91.  No ETOH use. No known liver disease.   RTC 3 months MD, labs (CBC w/, CMP)  Sharla Davis, PA-C 6/17/20252:13 PM

## 2024-05-27 DIAGNOSIS — H6123 Impacted cerumen, bilateral: Secondary | ICD-10-CM | POA: Diagnosis not present

## 2024-05-27 DIAGNOSIS — K118 Other diseases of salivary glands: Secondary | ICD-10-CM | POA: Diagnosis not present

## 2024-06-01 DIAGNOSIS — I480 Paroxysmal atrial fibrillation: Secondary | ICD-10-CM | POA: Diagnosis not present

## 2024-06-01 DIAGNOSIS — I10 Essential (primary) hypertension: Secondary | ICD-10-CM | POA: Diagnosis not present

## 2024-06-01 DIAGNOSIS — Z1330 Encounter for screening examination for mental health and behavioral disorders, unspecified: Secondary | ICD-10-CM | POA: Diagnosis not present

## 2024-06-06 ENCOUNTER — Other Ambulatory Visit: Payer: Self-pay | Admitting: Family Medicine

## 2024-06-06 DIAGNOSIS — I10 Essential (primary) hypertension: Secondary | ICD-10-CM

## 2024-06-07 ENCOUNTER — Ambulatory Visit: Payer: Self-pay | Admitting: Family Medicine

## 2024-06-07 NOTE — Telephone Encounter (Signed)
 Patient scheduled for 06/08/24 with Dr. Alvan

## 2024-06-07 NOTE — Telephone Encounter (Signed)
 FYI Only or Action Required?: FYI only for provider.  Patient was last seen in primary care on 02/02/2024 by Alvan Dorothyann BIRCH, MD. Called Nurse Triage reporting Breast Pain. Symptoms began several years ago. Interventions attempted: Other: Warm compress. Symptoms are: gradually improving.  Triage Disposition: See Physician Within 24 Hours  Patient/caregiver understands and will follow disposition?: Yes                Copied from CRM [id number]. Topic: Clinical - Red Word Triage >> Jun 07, 2024  9:07 AM Susanna ORN wrote: Red Word that prompted transfer to Nurse Triage: Patient states she has something under her left breast that looks like a black head. States it normally shows on her mammogram and they tape it up. Patient states on Friday night it started hurting and still hurts. It was oozing blood at first but now is oozing pus and it's very sore and red. Reason for Disposition  [1] Breast looks infected (spreading redness, feels hot or painful to touch) AND [2] no fever  Answer Assessment - Initial Assessment Questions SYMPTOM: What's the main symptom you're concerned about?  (e.g., lump, pain, rash, nipple discharge)    Sore, red, and oozing pus blackhead LOCATION: Where is the blackhead located?     Left breast ONSET: When did blackhead  start?     Few years OTHER SYMPTOMS: Do you have any other symptoms? (e.g., fever, breast pain, redness or rash, nipple discharge)     Oozing pus stopped yesterday, red  Protocols used: Breast Symptoms-A-AH

## 2024-06-08 ENCOUNTER — Ambulatory Visit (INDEPENDENT_AMBULATORY_CARE_PROVIDER_SITE_OTHER): Admitting: Family Medicine

## 2024-06-08 VITALS — BP 135/60 | HR 57 | Ht 63.0 in | Wt 197.1 lb

## 2024-06-08 DIAGNOSIS — L089 Local infection of the skin and subcutaneous tissue, unspecified: Secondary | ICD-10-CM

## 2024-06-08 DIAGNOSIS — L723 Sebaceous cyst: Secondary | ICD-10-CM | POA: Diagnosis not present

## 2024-06-08 DIAGNOSIS — N611 Abscess of the breast and nipple: Secondary | ICD-10-CM | POA: Diagnosis not present

## 2024-06-08 MED ORDER — DOXYCYCLINE HYCLATE 100 MG PO TABS
100.0000 mg | ORAL_TABLET | Freq: Two times a day (BID) | ORAL | 0 refills | Status: DC
Start: 2024-06-08 — End: 2024-08-02

## 2024-06-08 NOTE — Patient Instructions (Signed)
 Keep the dressing on until tomorrow morning at that point you can take it off and replace it with a fresh bandage.  If the gauze comes out on its own it is perfectly fine.  But if not the just leave it in place.  On the second morning when you get up to change the bandage go ahead and pull the string out again if it does not come out on its own.  The following day you can use a smaller bandage maybe even just a Band-Aid as long as it is not draining a lot and apply Vaseline. Make sure to go ahead and start the doxycycline. We are sending a wound culture so we will let you know if we need to change antibiotics.

## 2024-06-08 NOTE — Progress Notes (Signed)
   Acute Office Visit  Subjective:     Patient ID: Margaret Smith, female    DOB: Feb 03, 1951, 73 y.o.   MRN: [id number]  Chief Complaint  Patient presents with   Recurrent Skin Infections    Pt has boil under L breast that she noticed on Friday and it got worse over the weekend    HPI Patient is in today for lesion under her left lateral breast.  Noticed it Friday.  Got more painful under the weekend.  Hit is with handle on lawn mower.  It did drain the last couple of days.   ROS      Objective:    BP 135/60   Pulse (!) 57   Ht 5' 3 (1.6 m)   Wt 197 lb 1.9 oz (89.4 kg)   SpO2 97%   BMI 34.92 kg/m     Physical Exam  Skin:    Comments: Underneath the nipple on the left breast she has an erythematous papular area with a small whitehead in the center.  She has erythema extending at least 2 inches beyond the wound up onto her nipple.     Incision and Drainage Procedure Note  Pre-operative Diagnosis: Infected sebaceous cyst with some surrounding cellulitis going up to the nipple.  Post-operative Diagnosis: same  Indications: infected cyst  Anesthesia: 1% plain lidocaine  Procedure Details  The procedure, risks and complications have been discussed in detail (including, but not limited to airway compromise, infection, bleeding) with the patient, and the patient gave consent to the procedure.  The skin was sterilely prepped with chlorhexidine. After adequate local anesthesia, I&D with a #11 blade was performed on the left breast. Purulent drainage: present The patient was observed until stable.  Findings: Wound culture obtained.  EBL: trace  Drains: Iodoform 1/4 inch packing placed into wound.  Condition: Tolerated procedure well and Stable   Complications: none.    No results found for any visits on 06/08/24.      Assessment & Plan:   Problem List Items Addressed This Visit   None Visit Diagnoses       Abscess of left breast    -  Primary    Relevant Orders   Wound culture     Infected sebaceous cyst          Tolerated procedure well.  Wound culture obtained.  We did remove some of the hardened sebum from the i o wound.  Wound probed with sterile applicator and forceps.  No tracks found.  Will treat with oral doxycycline since she also has some erythema going up to the nipple.  Wound care instructions given.  Call if not better or if concerns about wound healing.  Meds ordered this encounter  Medications   doxycycline (VIBRA-TABS) 100 MG tablet    Sig: Take 1 tablet (100 mg total) by mouth 2 (two) times daily.    Dispense:  14 tablet    Refill:  0    No follow-ups on file.  Dorothyann Byars, MD

## 2024-06-11 LAB — WOUND CULTURE

## 2024-06-12 ENCOUNTER — Ambulatory Visit: Payer: Self-pay | Admitting: Family Medicine

## 2024-06-12 NOTE — Progress Notes (Signed)
Wound culture is negative

## 2024-07-14 DIAGNOSIS — Z6837 Body mass index (BMI) 37.0-37.9, adult: Secondary | ICD-10-CM | POA: Diagnosis not present

## 2024-07-14 DIAGNOSIS — G2581 Restless legs syndrome: Secondary | ICD-10-CM | POA: Diagnosis not present

## 2024-07-14 DIAGNOSIS — G4733 Obstructive sleep apnea (adult) (pediatric): Secondary | ICD-10-CM | POA: Diagnosis not present

## 2024-07-14 DIAGNOSIS — I10 Essential (primary) hypertension: Secondary | ICD-10-CM | POA: Diagnosis not present

## 2024-07-14 DIAGNOSIS — I4891 Unspecified atrial fibrillation: Secondary | ICD-10-CM | POA: Diagnosis not present

## 2024-08-02 ENCOUNTER — Encounter: Payer: Self-pay | Admitting: Family Medicine

## 2024-08-02 ENCOUNTER — Ambulatory Visit (INDEPENDENT_AMBULATORY_CARE_PROVIDER_SITE_OTHER): Payer: Medicare Other | Admitting: Family Medicine

## 2024-08-02 VITALS — BP 128/62 | HR 52 | Ht 63.0 in | Wt 199.0 lb

## 2024-08-02 DIAGNOSIS — E785 Hyperlipidemia, unspecified: Secondary | ICD-10-CM | POA: Diagnosis not present

## 2024-08-02 DIAGNOSIS — G4733 Obstructive sleep apnea (adult) (pediatric): Secondary | ICD-10-CM | POA: Diagnosis not present

## 2024-08-02 DIAGNOSIS — I10 Essential (primary) hypertension: Secondary | ICD-10-CM | POA: Diagnosis not present

## 2024-08-02 DIAGNOSIS — Z6835 Body mass index (BMI) 35.0-35.9, adult: Secondary | ICD-10-CM | POA: Diagnosis not present

## 2024-08-02 DIAGNOSIS — D692 Other nonthrombocytopenic purpura: Secondary | ICD-10-CM | POA: Insufficient documentation

## 2024-08-02 DIAGNOSIS — F5101 Primary insomnia: Secondary | ICD-10-CM | POA: Diagnosis not present

## 2024-08-02 NOTE — Assessment & Plan Note (Signed)
 Repeat BP looks awesome!!!! Will get updated labs.

## 2024-08-02 NOTE — Assessment & Plan Note (Signed)
 Is trying to consistently wear her CPAP.  But she does wake up usually around 3 or 4 AM and then has significant difficulty going back to sleep.

## 2024-08-02 NOTE — Progress Notes (Signed)
 Established Patient Office Visit  Subjective  Patient ID: Margaret Smith, female    DOB: [birth year]  Age: 73 y.o. MRN: [id number]  Chief Complaint  Patient presents with   Hypertension   Insomnia    Pt is taking 50 mg of trazodone . She is also taking/drinking calm tea 1 hour before bed she said that she has been waking up around 3 am - 4 am.    HPI  Discussed the use of AI scribe software for clinical note transcription with the patient, who gave verbal consent to proceed.  History of Present Illness Kamari Bilek is a 73 year old female with hypertension who presents for blood pressure management and weight concerns.  Hypertension - Blood pressure measured at 120/60 mmHg at a sleep medicine visit two weeks ago, which is lower than typical for her in a clinical setting - Does not regularly monitor blood pressure at home - Blood pressure readings at the [facility] center range from 111 mmHg to 130 mmHg systolic - Currently taking losartan  25 mg daily, the lowest available dose  Weight management and dietary habits - Current weight is approximately 199 pounds - Difficulty with weight loss despite preparing meals at home and avoiding salt - Occasionally consumes fried foods but rarely eats out - Physical activity includes cleaning and walking; does not use exercise machines  Hemoptysis (history) - History of hospitalization after an episode of coughing up blood while showering; no additional details provided  Bone health and laboratory monitoring - Currently taking calcium  supplementation for bone health - Blood work completed in June      ROS    Objective:     BP 128/62   Pulse (!) 52   Ht 5' 3 (1.6 m)   Wt 199 lb 0.6 oz (90.3 kg)   SpO2 98%   BMI 35.26 kg/m    Physical Exam Vitals and nursing note reviewed.  Constitutional:      Appearance: Normal appearance.  HENT:     Head: Normocephalic and atraumatic.  Eyes:     Conjunctiva/sclera: Conjunctivae  normal.  Cardiovascular:     Rate and Rhythm: Normal rate and regular rhythm.  Pulmonary:     Effort: Pulmonary effort is normal.     Breath sounds: Normal breath sounds.  Skin:    General: Skin is warm and dry.     Comments: Bruising present on her forearms.  Neurological:     Mental Status: She is alert.  Psychiatric:        Mood and Affect: Mood normal.      No results found for any visits on 08/02/24.    The 10-year ASCVD risk score (Arnett DK, et al., 2019) is: 11.6%    Assessment & Plan:   Problem List Items Addressed This Visit       Cardiovascular and Mediastinum   Purpura senilis (HCC)   Primary hypertension - Primary   Repeat BP looks awesome!!!! Will get updated labs.       Relevant Orders   Lipid panel   CMP14+EGFR   TSH     Respiratory   OSA on CPAP   Is trying to consistently wear her CPAP.  But she does wake up usually around 3 or 4 AM and then has significant difficulty going back to sleep.        Other   Insomnia   We discussed options.  Okay to increase trazodone  to 2 tabs nightly if that works well she can let me  know and I can send over the 100 mg tabs.  She is still wearing her CPAP regularly.      Other Visit Diagnoses       Hyperlipidemia, unspecified hyperlipidemia type       Relevant Orders   Lipid panel   CMP14+EGFR   TSH     BMI 35.0-35.9,adult       Relevant Orders   Amb Ref to Medical Weight Management      Assessment and Plan Assessment & Plan Obesity Obesity persists despite lifestyle changes. She is motivated for weight management, suitable for specialized intervention. - [facility]er to [facility] for specialized weight management care.  Hypertension Blood pressure shows variability. Current medication is losartan  25 mg. Goal is consistent readings in the 120s. - Recheck blood pressure at the end of the visit. - Consider increasing losartan  dose if blood pressure remains elevated.  Senile  purpura Senile purpura due to age-related collagen loss and skin thinning. Bruising occurs easily.  General Health Maintenance - Encourage flu vaccination in September. - Check if cholesterol and other blood work are due.    No follow-ups on file.    Dorothyann Byars, MD

## 2024-08-02 NOTE — Assessment & Plan Note (Signed)
 We discussed options.  Okay to increase trazodone  to 2 tabs nightly if that works well she can let me know and I can send over the 100 mg tabs.  She is still wearing her CPAP regularly.

## 2024-08-03 LAB — LIPID PANEL
Chol/HDL Ratio: 1.9 ratio (ref 0.0–4.4)
Cholesterol, Total: 149 mg/dL (ref 100–199)
HDL: 77 mg/dL (ref >39)
LDL Chol Calc (NIH): 62 mg/dL (ref 0–99)
Triglycerides: 42 mg/dL (ref 0–149)
VLDL Cholesterol Cal: 10 mg/dL (ref 5–40)

## 2024-08-03 LAB — CMP14+EGFR
ALT: 17 IU/L (ref 0–32)
AST: 31 IU/L (ref 0–40)
Albumin: 4.2 g/dL (ref 3.8–4.8)
Alkaline Phosphatase: 81 IU/L (ref 44–121)
BUN/Creatinine Ratio: 20 (ref 12–28)
BUN: 15 mg/dL (ref 8–27)
Bilirubin Total: 0.3 mg/dL (ref 0.0–1.2)
CO2: 21 mmol/L (ref 20–29)
Calcium: 9.5 mg/dL (ref 8.7–10.3)
Chloride: 103 mmol/L (ref 96–106)
Creatinine, Ser: 0.74 mg/dL (ref 0.57–1.00)
Globulin, Total: 2.4 g/dL (ref 1.5–4.5)
Glucose: 84 mg/dL (ref 70–99)
Potassium: 4.8 mmol/L (ref 3.5–5.2)
Sodium: 139 mmol/L (ref 134–144)
Total Protein: 6.6 g/dL (ref 6.0–8.5)
eGFR: 86 mL/min/1.73 (ref >59)

## 2024-08-03 LAB — TSH: TSH: 1.39 u[IU]/mL (ref 0.450–4.500)

## 2024-08-04 ENCOUNTER — Ambulatory Visit: Payer: Self-pay | Admitting: Family Medicine

## 2024-08-04 NOTE — Progress Notes (Signed)
 Hi Savita, total cholesterol and LDL look great.  Metabolic panel and thyroid  are normal.

## 2024-08-10 ENCOUNTER — Encounter: Payer: Self-pay | Admitting: Sports Medicine

## 2024-08-12 ENCOUNTER — Ambulatory Visit (INDEPENDENT_AMBULATORY_CARE_PROVIDER_SITE_OTHER): Admitting: Nurse Practitioner

## 2024-08-12 ENCOUNTER — Encounter: Payer: Self-pay | Admitting: Nurse Practitioner

## 2024-08-12 VITALS — BP 136/78 | HR 55 | Temp 98.2°F | Ht 63.0 in | Wt 198.0 lb

## 2024-08-12 DIAGNOSIS — I10 Essential (primary) hypertension: Secondary | ICD-10-CM | POA: Diagnosis not present

## 2024-08-12 DIAGNOSIS — E66812 Obesity, class 2: Secondary | ICD-10-CM | POA: Diagnosis not present

## 2024-08-12 DIAGNOSIS — Z6835 Body mass index (BMI) 35.0-35.9, adult: Secondary | ICD-10-CM | POA: Diagnosis not present

## 2024-08-12 DIAGNOSIS — G4733 Obstructive sleep apnea (adult) (pediatric): Secondary | ICD-10-CM | POA: Diagnosis not present

## 2024-08-12 DIAGNOSIS — E785 Hyperlipidemia, unspecified: Secondary | ICD-10-CM | POA: Diagnosis not present

## 2024-08-12 DIAGNOSIS — I480 Paroxysmal atrial fibrillation: Secondary | ICD-10-CM | POA: Diagnosis not present

## 2024-08-12 NOTE — Progress Notes (Signed)
 Office: [phone]  /  Fax: [phone]   Initial Visit  Margaret Smith was seen in clinic today to evaluate for obesity. She is interested in losing weight to improve overall health and reduce the risk of weight related complications. She presents today to review program treatment options, initial physical assessment, and evaluation.     She was referred by: PCP  When asked what else they would like to accomplish? She states: Adopt a healthier eating pattern and lifestyle, Improve energy levels and physical activity, Improve existing medical conditions, Reduce number of medications, and Improve quality of life   When asked how has your weight affected you? She states: Contributed to medical problems, Contributed to orthopedic problems or mobility issues, Having fatigue, and Having poor endurance  Some associated conditions: mitral regurgitation, a fib, HTN, tricuspid regurgitation, OSAS on CPAP, thyromegaly, osteoporosis, osteoarthritis of knees, thrombocytopenia, bradycardia, osteoporosis and insomnia    Contributing factors: family history of obesity and slow metabolism for age  Weight promoting medications identified: None  Current nutrition plan: None  Current level of physical activity: walking daily for 50-60 minutes  Current or previous pharmacotherapy: took something in the past  Response to medication: Ineffective so it was discontinued   Past medical history includes:   Past Medical History:  Diagnosis Date   Hypertension    Pneumonia    Thrombocytopenia (HCC) 11/15/2020     Objective:   BP 136/78   Pulse (!) 55   Temp 98.2 F (36.8 C)   Ht 5' 3 (1.6 m)   Wt 198 lb (89.8 kg)   SpO2 98%   BMI 35.07 kg/m  She was weighed on the bioimpedance scale: Body mass index is 35.07 kg/m.  Peak Weight:240 lbs , Body Fat%:46.6%, Visceral Fat Rating:15, Weight trend over the last 12 months: Increasing  General:  Alert, oriented and cooperative. Patient is in no  acute distress.  Respiratory: Normal respiratory effort, no problems with respiration noted   Gait: able to ambulate independently  Mental Status: Normal mood and affect. Normal behavior. Normal judgment and thought content.   DIAGNOSTIC DATA REVIEWED:  BMET    Component Value Date/Time   NA 139 08/02/2024 1020   K 4.8 08/02/2024 1020   CL 103 08/02/2024 1020   CO2 21 08/02/2024 1020   GLUCOSE 84 08/02/2024 1020   GLUCOSE 94 05/25/2024 1118   BUN 15 08/02/2024 1020   CREATININE 0.74 08/02/2024 1020   CREATININE 0.80 05/25/2024 1118   CREATININE 0.70 10/24/2022 0000   CALCIUM  9.5 08/02/2024 1020   GFRNONAA >60 05/25/2024 1118   GFRNONAA 88 10/19/2020 0000   GFRAA 101 10/19/2020 0000   Lab Results  Component Value Date   HGBA1C 5.1 04/12/2015   HGBA1C 5.9 11/12/2013   No results found for: INSULIN CBC    Component Value Date/Time   WBC 5.7 05/25/2024 1118   WBC 3.8 10/23/2021 0000   RBC 4.24 05/25/2024 1118   HGB 12.2 05/25/2024 1118   HCT 38.0 05/25/2024 1118   PLT 83 (L) 05/25/2024 1118   MCV 89.6 05/25/2024 1118   MCH 28.8 05/25/2024 1118   MCHC 32.1 05/25/2024 1118   RDW 14.5 05/25/2024 1118   Iron/TIBC/Ferritin/ %Sat No results found for: IRON, TIBC, FERRITIN, IRONPCTSAT Lipid Panel     Component Value Date/Time   CHOL 149 08/02/2024 1020   TRIG 42 08/02/2024 1020   HDL 77 08/02/2024 1020   CHOLHDL 1.9 08/02/2024 1020   CHOLHDL 2.1 10/24/2022 0000  VLDL 9 12/30/2016 0930   LDLCALC 62 08/02/2024 1020   LDLCALC 69 10/24/2022 0000   Hepatic Function Panel     Component Value Date/Time   PROT 6.6 08/02/2024 1020   ALBUMIN 4.2 08/02/2024 1020   AST 31 08/02/2024 1020   AST 27 05/25/2024 1118   ALT 17 08/02/2024 1020   ALT 16 05/25/2024 1118   ALKPHOS 81 08/02/2024 1020   BILITOT 0.3 08/02/2024 1020   BILITOT 0.5 05/25/2024 1118   BILIDIR 0.1 01/27/2017 1334   IBILI 0.4 01/27/2017 1334      Component Value Date/Time   TSH 1.390  08/02/2024 1020     Assessment and Plan:   Primary hypertension Managed by PCP and cardiology.  Continue to follow with PCP and cardiac and take meds as directed.  Hyperlipidemia, unspecified hyperlipidemia type Managed by PCP and cardiology.  Continue to follow with cardiology and PCP can to take meds as directed.  OSA on CPAP Patient reports wearing CPAP at least 4 hours nightly.  Continue CPAP nightly.  Paroxysmal A-fib (HCC) Continue to follow-up with cardiology  Obesity, Class II, BMI 35-39.9        Obesity Treatment / Action Plan:  Patient will work on garnering support from family and friends to begin weight loss journey. Will work on eliminating or reducing the presence of highly palatable, calorie dense foods in the home. Will complete provided nutritional and psychosocial assessment questionnaire before the next appointment. Will be scheduled for indirect calorimetry to determine resting energy expenditure in a fasting state.  This will allow us  to create a reduced calorie, high-protein meal plan to promote loss of fat mass while preserving muscle mass. Counseled on the health benefits of losing 5%-15% of total body weight. Was counseled on nutritional approaches to weight loss and benefits of reducing processed foods and consuming plant-based foods and high quality protein as part of nutritional weight management. Was counseled on pharmacotherapy and role as an adjunct in weight management.   Obesity Education Performed Today:  She was weighed on the bioimpedance scale and results were discussed and documented in the synopsis.  We discussed obesity as a disease and the importance of a more detailed evaluation of all the factors contributing to the disease.  We discussed the importance of long term lifestyle changes which include nutrition, exercise and behavioral modifications as well as the importance of customizing this to her specific health and social  needs.  We discussed the benefits of reaching a healthier weight to alleviate the symptoms of existing conditions and reduce the risks of the biomechanical, metabolic and psychological effects of obesity.  Margaret Smith appears to be in the action stage of change and states they are ready to start intensive lifestyle modifications and behavioral modifications.  30 minutes was spent today on this visit including the above counseling, pre-visit chart review, and post-visit documentation.  Reviewed by clinician on day of visit: allergies, medications, problem list, medical history, surgical history, family history, social history, and previous encounter notes pertinent to obesity diagnosis.    Corean SAUNDERS Jenascia Bumpass FNP-C

## 2024-08-25 ENCOUNTER — Encounter: Payer: Self-pay | Admitting: Hematology & Oncology

## 2024-08-25 ENCOUNTER — Inpatient Hospital Stay (HOSPITAL_BASED_OUTPATIENT_CLINIC_OR_DEPARTMENT_OTHER): Admitting: Hematology & Oncology

## 2024-08-25 ENCOUNTER — Inpatient Hospital Stay: Attending: Medical Oncology

## 2024-08-25 VITALS — BP 138/64 | HR 51 | Temp 97.8°F | Resp 20 | Ht 63.0 in | Wt 201.1 lb

## 2024-08-25 DIAGNOSIS — D696 Thrombocytopenia, unspecified: Secondary | ICD-10-CM

## 2024-08-25 DIAGNOSIS — I4891 Unspecified atrial fibrillation: Secondary | ICD-10-CM | POA: Insufficient documentation

## 2024-08-25 DIAGNOSIS — Z7982 Long term (current) use of aspirin: Secondary | ICD-10-CM | POA: Insufficient documentation

## 2024-08-25 LAB — CBC WITH DIFFERENTIAL (CANCER CENTER ONLY)
Abs Immature Granulocytes: 0.01 K/uL (ref 0.00–0.07)
Basophils Absolute: 0 K/uL (ref 0.0–0.1)
Basophils Relative: 1 %
Eosinophils Absolute: 0.1 K/uL (ref 0.0–0.5)
Eosinophils Relative: 1 %
HCT: 39.1 % (ref 36.0–46.0)
Hemoglobin: 12.5 g/dL (ref 12.0–15.0)
Immature Granulocytes: 0 %
Lymphocytes Relative: 31 %
Lymphs Abs: 1.8 K/uL (ref 0.7–4.0)
MCH: 28.7 pg (ref 26.0–34.0)
MCHC: 32 g/dL (ref 30.0–36.0)
MCV: 89.9 fL (ref 80.0–100.0)
Monocytes Absolute: 0.6 K/uL (ref 0.1–1.0)
Monocytes Relative: 11 %
Neutro Abs: 3.2 K/uL (ref 1.7–7.7)
Neutrophils Relative %: 56 %
Platelet Count: 105 K/uL — ABNORMAL LOW (ref 150–400)
RBC: 4.35 MIL/uL (ref 3.87–5.11)
RDW: 14.6 % (ref 11.5–15.5)
WBC Count: 5.7 K/uL (ref 4.0–10.5)
nRBC: 0 % (ref 0.0–0.2)

## 2024-08-25 LAB — CMP (CANCER CENTER ONLY)
ALT: 25 U/L (ref 0–44)
AST: 39 U/L (ref 15–41)
Albumin: 4.4 g/dL (ref 3.5–5.0)
Alkaline Phosphatase: 74 U/L (ref 38–126)
Anion gap: 10 (ref 5–15)
BUN: 12 mg/dL (ref 8–23)
CO2: 26 mmol/L (ref 22–32)
Calcium: 9.7 mg/dL (ref 8.9–10.3)
Chloride: 107 mmol/L (ref 98–111)
Creatinine: 0.77 mg/dL (ref 0.44–1.00)
GFR, Estimated: >60 mL/min (ref >60)
Glucose, Bld: 85 mg/dL (ref 70–99)
Potassium: 5 mmol/L (ref 3.5–5.1)
Sodium: 143 mmol/L (ref 135–145)
Total Bilirubin: 0.4 mg/dL (ref 0.0–1.2)
Total Protein: 7.1 g/dL (ref 6.5–8.1)

## 2024-08-25 NOTE — Progress Notes (Signed)
 Hematology and Oncology Follow Up Visit  Margaret Smith [id number] [birth year] 73 y.o. 08/25/2024   Principle Diagnosis:  Thrombocytopenia-likely mild ITP Atrial fibrillation  Previous Therapy: Eliquis 5 mg p.o. twice daily --DC secondary to Watchman procedure.  Current Therapy:   Observation 81mg  asa     Interim History:  Margaret Smith is back for her follow-up.  She last saw us  back in June.  Since then, she been busy try to clean out her parent's house.  This is taking quite a while.  She has had no problems with bleeding or bruising.  She has had no issues with nausea or vomiting.  There is no change in bowel or bladder habits.  There is been no headache.  She has had no fever.  She has avoided COVID.  Overall, I would have to say that her performance status is probably ECOG 1.      Wt Readings from Last 3 Encounters:  08/25/24 201 lb 1.3 oz (91.2 kg)  08/12/24 198 lb (89.8 kg)  08/02/24 199 lb 0.6 oz (90.3 kg)     Medications:  Current Outpatient Medications:    acetaminophen (TYLENOL 8 HOUR ARTHRITIS PAIN) 650 MG CR tablet, Take 650 mg by mouth every 8 (eight) hours as needed for pain., Disp: , Rfl:    aspirin  EC 81 MG tablet, Take 81 mg by mouth daily. Swallow whole., Disp: , Rfl:    calcium  carbonate (OSCAL) 1500 (600 Ca) MG TABS tablet, Take 600 mg of elemental calcium  by mouth daily with breakfast., Disp: , Rfl:    losartan  (COZAAR ) 25 MG tablet, TAKE 1 TABLET (25 MG TOTAL) BY MOUTH DAILY., Disp: 90 tablet, Rfl: 1   OVER THE COUNTER MEDICATION, at bedtime as needed. Calm, Disp: , Rfl:    pramipexole (MIRAPEX) 0.5 MG tablet, Take 0.5 mg by mouth at bedtime., Disp: , Rfl:    rosuvastatin  (CRESTOR ) 10 MG tablet, TAKE 1 TABLET BY MOUTH EVERY DAY AT BEDTIME, Disp: 90 tablet, Rfl: 3   traZODone  (DESYREL ) 50 MG tablet, TAKE 1/2 TO 1 TABLET BY MOUTH AT BEDTIME AS NEEDED FOR SLEEP, Disp: 30 tablet, Rfl: 3  Allergies: No Known Allergies  Past Medical History, Surgical  history, Social history, and Family History were reviewed and updated.  Review of Systems: Review of Systems  Constitutional: Negative.   HENT:  Negative.    Eyes: Negative.   Respiratory: Negative.    Cardiovascular: Negative.   Gastrointestinal: Negative.   Endocrine: Negative.   Genitourinary: Negative.    Musculoskeletal: Negative.   Skin: Negative.   Neurological: Negative.   Hematological: Negative.   Psychiatric/Behavioral: Negative.      Physical Exam: Vitals:   08/25/24 1153 08/25/24 1158  BP: (!) 143/45 138/64  Pulse: (!) 51   Resp: 20   Temp: 97.8 F (36.6 C)   SpO2: 100%      Wt Readings from Last 3 Encounters:  08/25/24 201 lb 1.3 oz (91.2 kg)  08/12/24 198 lb (89.8 kg)  08/02/24 199 lb 0.6 oz (90.3 kg)    Physical Exam Vitals reviewed.  HENT:     Head: Normocephalic and atraumatic.  Eyes:     Pupils: Pupils are equal, round, and reactive to light.  Cardiovascular:     Rate and Rhythm: Normal rate and regular rhythm.     Heart sounds: Normal heart sounds.  Pulmonary:     Effort: Pulmonary effort is normal.     Breath sounds: Normal breath sounds.  Abdominal:  General: Bowel sounds are normal.     Palpations: Abdomen is soft.  Musculoskeletal:        General: No tenderness or deformity. Normal range of motion.     Cervical back: Normal range of motion.  Lymphadenopathy:     Cervical: No cervical adenopathy.  Skin:    General: Skin is warm and dry.     Findings: No erythema or rash.  Neurological:     Mental Status: She is alert and oriented to person, place, and time.  Psychiatric:        Behavior: Behavior normal.        Thought Content: Thought content normal.        Judgment: Judgment normal.     Lab Results  Component Value Date   WBC 5.7 08/25/2024   HGB 12.5 08/25/2024   HCT 39.1 08/25/2024   MCV 89.9 08/25/2024   PLT 105 (L) 08/25/2024     Chemistry      Component Value Date/Time   NA 139 08/02/2024 1020   K 4.8  08/02/2024 1020   CL 103 08/02/2024 1020   CO2 21 08/02/2024 1020   BUN 15 08/02/2024 1020   CREATININE 0.74 08/02/2024 1020   CREATININE 0.80 05/25/2024 1118   CREATININE 0.70 10/24/2022 0000   GLU 85 04/12/2015 0000      Component Value Date/Time   CALCIUM  9.5 08/02/2024 1020   ALKPHOS 81 08/02/2024 1020   AST 31 08/02/2024 1020   AST 27 05/25/2024 1118   ALT 17 08/02/2024 1020   ALT 16 05/25/2024 1118   BILITOT 0.3 08/02/2024 1020   BILITOT 0.5 05/25/2024 1118     Impression and Plan: Margaret Smith is a very nice 73 year old African-American female.  She is quite funny.  She used to work for the [employer].  For right now, everything looks great.  Her platelet count keeps trending upward.  I will get her blood smear under the microscope.  I really did not see anything that looked suspicious.  I think we can now get her through the Holiday season.  I know that she will be quite busy.  I would not want to interfere with her schedule.   Maude JONELLE Crease, MD 9/17/202512:02 PM

## 2024-09-02 DIAGNOSIS — Z23 Encounter for immunization: Secondary | ICD-10-CM | POA: Diagnosis not present

## 2024-09-07 ENCOUNTER — Encounter: Payer: Self-pay | Admitting: Family Medicine

## 2024-09-07 ENCOUNTER — Ambulatory Visit (INDEPENDENT_AMBULATORY_CARE_PROVIDER_SITE_OTHER): Admitting: Family Medicine

## 2024-09-07 VITALS — BP 167/76 | HR 57 | Temp 98.1°F | Ht 63.0 in | Wt 196.0 lb

## 2024-09-07 DIAGNOSIS — R5383 Other fatigue: Secondary | ICD-10-CM

## 2024-09-07 DIAGNOSIS — G4733 Obstructive sleep apnea (adult) (pediatric): Secondary | ICD-10-CM | POA: Diagnosis not present

## 2024-09-07 DIAGNOSIS — R0602 Shortness of breath: Secondary | ICD-10-CM

## 2024-09-07 DIAGNOSIS — I480 Paroxysmal atrial fibrillation: Secondary | ICD-10-CM

## 2024-09-07 DIAGNOSIS — M170 Bilateral primary osteoarthritis of knee: Secondary | ICD-10-CM

## 2024-09-07 DIAGNOSIS — Z6834 Body mass index (BMI) 34.0-34.9, adult: Secondary | ICD-10-CM

## 2024-09-07 DIAGNOSIS — R001 Bradycardia, unspecified: Secondary | ICD-10-CM | POA: Diagnosis not present

## 2024-09-07 DIAGNOSIS — M810 Age-related osteoporosis without current pathological fracture: Secondary | ICD-10-CM | POA: Diagnosis not present

## 2024-09-07 DIAGNOSIS — R7309 Other abnormal glucose: Secondary | ICD-10-CM

## 2024-09-07 NOTE — Progress Notes (Signed)
 At a Glance:  Vitals Temp: 98.1 F (36.7 C) BP: (!) 167/76 Pulse Rate: (!) 57 SpO2: 100 %   Anthropometric Measurements Height: 5' 3 (1.6 m) Weight: 196 lb (88.9 kg) BMI (Calculated): 34.73 Starting Weight: 196lb Peak Weight: 240lb   Body Composition  Body Fat %: 46.8 % Fat Mass (lbs): 92.2 lbs Muscle Mass (lbs): 99.2 lbs Total Body Water (lbs): 77 lbs Visceral Fat Rating : 15   Other Clinical Data RMR: 1512 Fasting: Yes Labs: Yes Today's Visit #: 1 Starting Date: 09/07/24    EKG: Normal sinus rhythm, rate 44.  Indirect Calorimeter completed today shows a VO2 of 220 and a REE of 1512.  Her calculated basal metabolic rate is 8530 thus her basal metabolic rate is better than expected.  Chief Complaint:  Obesity   Subjective:  Margaret Smith (MR# [id number]) is a 73 y.o. female who presents for evaluation and treatment of obesity and related comorbidities.   Glynis is currently in the action stage of change and ready to dedicate time achieving and maintaining a healthier weight. Caoilainn is interested in becoming our patient and working on intensive lifestyle modifications including (but not limited to) diet and exercise for weight loss.  Olivette has been struggling with her weight. She has been unsuccessful in either losing weight, maintaining weight loss, or reaching her healthy weight goal.  She lives alone and does her own yardwork and housework.  She gained more weight after her A Fib diagnosis.  She has struggled with poor sleep at night for years.  She denies large food volumes at mealtime or craving sweets.  Milanni's habits were reviewed today and are as follows: her desired weight loss is 40 lb, she started gaining in childhood. and she struggles with emotional eating.   Other Fatigue Sihaam admits to daytime somnolence and admits to waking up still tired. Patient has a history of symptoms of daytime fatigue. Linley generally gets 3 hours of sleep per  night, and states that she has nightime awakenings. Snoring is present. Apneic episodes are present. Epworth Sleepiness Score is 5.   She is on CPAP at night and aims for 4 hrs of wear at night.  Shortness of Breath Maci notes increasing shortness of breath with exercising and seems to be worsening over time with weight gain. She notes getting out of breath sooner with activity than she used to. This has gotten worse recently. Yeraldi denies shortness of breath at rest or orthopnea.   Depression Screen Aldena's Food and Mood (modified PHQ-9) score was 15.     08/25/2024   11:55 AM  Depression screen PHQ 2/9  Decreased Interest 0  Down, Depressed, Hopeless 0  PHQ - 2 Score 0     Assessment and Plan:   Other Fatigue Siyona does feel that her weight is causing her energy to be lower than it should be. Fatigue may be related to obesity, depression or many other causes. Labs will be ordered, and in the meanwhile, Katilyn will focus on self care including making healthy food choices, increasing physical activity and focusing on stress reduction.  Shortness of Breath Cherie does feel that she gets out of breath more easily that she used to when she exercises. Moneka's shortness of breath appears to be obesity related and exercise induced. She has agreed to work on weight loss and gradually increase exercise to treat her exercise induced shortness of breath. Will continue to monitor closely.  Zayden had a positive depression screening. Depression  is commonly associated with obesity and often results in emotional eating behaviors. We will monitor this closely and work on CBT to help improve the non-hunger eating patterns. Referral to Psychology may be required if no improvement is seen as she continues in our clinic.    Problem List Items Addressed This Visit     Bradycardia New Reviewed EKG results with patient and her HR showed sinus brady at 44 BPM She notes that her Fit Bit has had her  HR in the 40s several times this week. She denies DOE, SOB at rest, palpitations, CP or feeling faint She is not on any rate lowering medications Will cc Lynwood Peek FNP who saw her last for cardiology care    Paroxysmal A-fib [facility]) Doing well but has noted more difficulty with weight loss since her A Fib diagnosis and placement of the Watchman device.  Avoid use of stimulants for obesity treatment    OSA on CPAP Reports nightly use of CPAP up to 4 hrs at night but overall a lack of adequate sleep and poor sleep quality at night.  She has talked to her PCP and neurologist about this.  Begin active plan for weight loss and work on improving sleep hygiene.    Osteoporosis She reports no recent use of a vitamin D supplement   Relevant Orders   VITAMIN D 25 Hydroxy (Vit-D Deficiency, Fractures)   Other Visit Diagnoses       Other fatigue    -  Primary   Relevant Orders   EKG 12-Lead (Completed)   Folate   Vitamin B12     SOBOE (shortness of breath on exertion)         Elevated glucose       Relevant Orders   Insulin, random   Hemoglobin A1c     Primary osteoarthritis of both knees  She is s/p L TKA but has continual R knee pain that limits walking some, unable to have R TKA done due to low platelets She is able to do yardwork and ADLs She is doing no resistance training Recommend chair exercises and adding in resistance training           Caylah is currently in the action stage of change and her goal is to get back to weightloss efforts . I recommend Nathalia begin the structured treatment plan as follows:  She has agreed to Category 3 Plan  Exercise goals: Older adults should follow the adult guidelines. When older adults cannot meet the adult guidelines, they should be as physically active as their abilities and conditions will allow.  Behavioral modification strategies:increasing lean protein intake, decreasing simple carbohydrates, increasing vegetables, increase H2O  intake, increase high fiber foods, no skipping meals, meal planning and cooking strategies, keeping healthy foods in the home, better snacking choices, and avoiding temptations  She was informed of the importance of frequent follow-up visits to maximize her success with intensive lifestyle modifications for her multiple health conditions. She was informed we would discuss her lab results at her next visit unless there is a critical issue that needs to be addressed sooner. Carma agreed to keep her next visit at the agreed upon time to discuss these results.  Objective:  General: Cooperative, alert, well developed, in no acute distress. HEENT: Conjunctivae and lids unremarkable. Cardiovascular: Regular rhythm.  Lungs: Normal work of breathing. Neurologic: No focal deficits.   Lab Results  Component Value Date   CREATININE 0.77 08/25/2024   BUN 12 08/25/2024  NA 143 08/25/2024   K 5.0 08/25/2024   CL 107 08/25/2024   CO2 26 08/25/2024   Lab Results  Component Value Date   ALT 25 08/25/2024   AST 39 08/25/2024   ALKPHOS 74 08/25/2024   BILITOT 0.4 08/25/2024   Lab Results  Component Value Date   HGBA1C 5.1 04/12/2015   HGBA1C 5.6 11/26/2013   HGBA1C 5.9 11/12/2013   No results found for: INSULIN Lab Results  Component Value Date   TSH 1.390 08/02/2024   Lab Results  Component Value Date   CHOL 149 08/02/2024   HDL 77 08/02/2024   LDLCALC 62 08/02/2024   TRIG 42 08/02/2024   CHOLHDL 1.9 08/02/2024   Lab Results  Component Value Date   WBC 5.7 08/25/2024   HGB 12.5 08/25/2024   HCT 39.1 08/25/2024   MCV 89.9 08/25/2024   PLT 105 (L) 08/25/2024   No results found for: IRON, TIBC, FERRITIN  Attestation Statements:  Reviewed by clinician on day of visit: allergies, medications, problem list, medical history, surgical history, family history, social history, and previous encounter notes.  Time spent on visit including pre-visit chart review and post-visit  charting and face- to face care including nutritional counseling, review of EKG, interpretation of body composition scale and indirect calorimetry and nutrition prescription  was 50 minutes.   [name], D.O. DABFM, DABOM [facility] [address] [place], [place] [address] [phone]

## 2024-09-08 LAB — INSULIN, RANDOM: INSULIN: 7 u[IU]/mL (ref 2.6–24.9)

## 2024-09-08 LAB — HEMOGLOBIN A1C
Est. average glucose Bld gHb Est-mCnc: 111 mg/dL
Hgb A1c MFr Bld: 5.5 % (ref 4.8–5.6)

## 2024-09-08 LAB — VITAMIN D 25 HYDROXY (VIT D DEFICIENCY, FRACTURES): Vit D, 25-Hydroxy: 21.3 ng/mL — ABNORMAL LOW (ref 30.0–100.0)

## 2024-09-08 LAB — FOLATE: Folate: 10.5 ng/mL (ref >3.0)

## 2024-09-08 LAB — VITAMIN B12: Vitamin B-12: 462 pg/mL (ref 232–1245)

## 2024-09-09 ENCOUNTER — Ambulatory Visit: Payer: Self-pay | Admitting: Family Medicine

## 2024-09-13 ENCOUNTER — Other Ambulatory Visit: Payer: Self-pay | Admitting: Family Medicine

## 2024-09-22 ENCOUNTER — Ambulatory Visit (INDEPENDENT_AMBULATORY_CARE_PROVIDER_SITE_OTHER): Admitting: Family Medicine

## 2024-09-22 ENCOUNTER — Encounter: Payer: Self-pay | Admitting: Family Medicine

## 2024-09-22 VITALS — BP 155/64 | HR 59 | Temp 97.9°F | Ht 63.0 in | Wt 194.0 lb

## 2024-09-22 DIAGNOSIS — I10 Essential (primary) hypertension: Secondary | ICD-10-CM

## 2024-09-22 DIAGNOSIS — M170 Bilateral primary osteoarthritis of knee: Secondary | ICD-10-CM

## 2024-09-22 DIAGNOSIS — Z6834 Body mass index (BMI) 34.0-34.9, adult: Secondary | ICD-10-CM | POA: Diagnosis not present

## 2024-09-22 DIAGNOSIS — G4733 Obstructive sleep apnea (adult) (pediatric): Secondary | ICD-10-CM | POA: Diagnosis not present

## 2024-09-22 DIAGNOSIS — E66811 Obesity, class 1: Secondary | ICD-10-CM

## 2024-09-22 DIAGNOSIS — E559 Vitamin D deficiency, unspecified: Secondary | ICD-10-CM

## 2024-09-22 MED ORDER — VITAMIN D (ERGOCALCIFEROL) 1.25 MG (50000 UNIT) PO CAPS: 50000.0000 [IU] | ORAL_CAPSULE | ORAL | 0 refills | Status: DC

## 2024-09-22 MED ORDER — LOSARTAN POTASSIUM 50 MG PO TABS: 50.0000 mg | ORAL_TABLET | Freq: Every day | ORAL | 0 refills | Status: DC

## 2024-09-22 NOTE — Progress Notes (Signed)
 Office: [phone]  /  Fax: [phone]  WEIGHT SUMMARY AND BIOMETRICS  Starting Date: 09/07/24  Starting Weight: 196lb   Weight Lost Since Last Visit: 2lb   Vitals Temp: 97.9 F (36.6 C) BP: (!) 155/64 Pulse Rate: (!) 59 SpO2: 100 %   Body Composition  Body Fat %: 45.9 % Fat Mass (lbs): 89.2 lbs Muscle Mass (lbs): 99.6 lbs Total Body Water (lbs): 77.4 lbs Visceral Fat Rating : 14    HPI  Chief Complaint: OBESITY  Margaret Smith is here to discuss her progress with her obesity treatment plan. She is on the the Category 3 Plan and states she is following her eating plan approximately 100 % of the time. She states she is did some yard work and walking.   Interval History:  Since last office visit she is down 2 lb She is up 0.4 lb of muscle mass and down 3 lb of body fat since last visit She is getting used to eating all 3 meals, mostly eating on plan and does feel better She feels adequately full at the end of her meals She is getting in her fruits and veggies She is doing well with cat 3 meal plan She is not eating out She usually has skinny pop at night  Pharmacotherapy: none  PHYSICAL EXAM:  Blood pressure (!) 155/64, pulse (!) 59, temperature 97.9 F (36.6 C), height 5' 3 (1.6 m), weight 194 lb (88 kg), SpO2 100%. Body mass index is 34.37 kg/m.  General: She is overweight, cooperative, alert, well developed, and in no acute distress. PSYCH: Has normal mood, affect and thought process.   Lungs: Normal breathing effort, no conversational dyspnea.   ASSESSMENT AND PLAN  TREATMENT PLAN FOR OBESITY:  Recommended Dietary Goals  Margaret Smith is currently in the action stage of change. As such, her goal is to continue weight management plan. She has agreed to the Category 3 Plan. Dietary changes reviewed on AVS   Behavioral Intervention  We discussed the following Behavioral Modification Strategies today: increasing lean protein intake to established goals,  increasing fiber rich foods, increasing water intake , work on meal planning and preparation, keeping healthy foods at home, avoiding temptations and identifying enticing environmental cues, continue to practice mindfulness when eating, and better snacking choices.  Additional resources provided today: NA  Recommended Physical Activity Goals  Margaret Smith to work up to 150 minutes of moderate intensity aerobic activity a week and strengthening exercises 2-3 times per week for cardiovascular health, weight loss maintenance and preservation of muscle mass.   She has agreed to Increase the intensity, frequency or duration of aerobic exercises    Pharmacotherapy changes for the treatment of obesity: none  ASSOCIATED CONDITIONS ADDRESSED TODAY  Primary hypertension Blood pressure remains elevated.  She reports good compliance taking losartan  25 mg once daily.  She has no scheduled follow-up with primary care.  Will increase her losartan  to 50 mg once daily.  Keep upcoming visit with cardiology as scheduled in December. -     Losartan  Potassium; Take 1 tablet (50 mg total) by mouth daily.  Dispense: 90 tablet; Refill: 0  Vitamin D deficiency Last vitamin D Lab Results  Component Value Date   VD25OH 21.3 (L) 09/07/2024   New.  Reviewed lab results with patient from last visit.  She has been inconsistently taking a calcium  and vitamin D supplement for osteoporosis.  She has complaints of fatigue.  We have discussed the importance of vitamin D for energy  level, bone health and immune function.  Begin vitamin D 50,000 IU once weekly and recheck lab in 3 to 4 months  -     Vitamin D (Ergocalciferol); Take 1 capsule (50,000 Units total) by mouth every 7 (seven) days.  Dispense: 5 capsule; Refill: 0  Obesity, Class I, BMI 30-34.9  BMI 34.0-34.9,adult  Primary osteoarthritis of both knees Stable.  She has healed well from her left knee replacement surgery and has some ongoing right knee  arthritis pain.  She has been able to walk up to 50 minutes at a time without limping.  She uses Tylenol arthritis as needed for pain.  Continue walking wearing good sneakers on a flat surface and continue active plan for weight reduction.  OSA on CPAP She reports good compliance wearing her CPAP for at least 4 hours at night but struggles with getting adequate sleep.  Continue to work on improving sleeping schedule, sleep hygiene and weight reduction.     She was informed of the importance of frequent follow up visits to maximize her success with intensive lifestyle modifications for her multiple health conditions.   ATTESTASTION STATEMENTS:  Reviewed by clinician on day of visit: allergies, medications, problem list, medical history, surgical history, family history, social history, and previous encounter notes pertinent to obesity diagnosis.   I have personally spent 30 minutes total time today in preparation, patient care, nutritional counseling and education,  and documentation for this visit, including the following: review of most recent clinical lab tests, prescribing medications/ refilling medications, reviewing medical assistant documentation, review and interpretation of bioimpedence results.     Margaret Smith, D.O. DABFM, DABOM [facility] [address] [place], [place] [address] [phone]

## 2024-09-22 NOTE — Patient Instructions (Addendum)
 Starchy vegetable serving sizes from :    Baked potato: \(1/4\) of a large potato (3 ounces)potatoes:  Corn: \(1/2\) cup Mashed or boiled potatoes: \(1/2\) cup or \(1/2\) medium potato (3 ounces) Yam or sweet potato (plain): \(1/2\) cup (3.5 ounces) Cassava or dasheen: \(1/3\) cupPeas, green: \(1/2\) cup Parsnips: \(1/2\) cup Winter squash (acorn, butternut): 1 cup Pumpkin puree: \(3/4\) cup  You have the option to reduce the meat with dinner and add 1/4 plate of CARB to dinner Allow 2 servings of fresh or frozen fruit (ANY)  Aim for a good 45 minute walk 3-4 days/ wk  Begin RX vitamin D weekly  Go up on Losartan  to 50 mg daily

## 2024-10-20 ENCOUNTER — Ambulatory Visit: Admitting: Family Medicine

## 2024-10-26 ENCOUNTER — Encounter: Payer: Self-pay | Admitting: Family Medicine

## 2024-10-26 ENCOUNTER — Ambulatory Visit (INDEPENDENT_AMBULATORY_CARE_PROVIDER_SITE_OTHER): Admitting: Family Medicine

## 2024-10-26 VITALS — BP 159/71 | HR 50 | Temp 97.7°F | Ht 63.0 in | Wt 190.0 lb

## 2024-10-26 DIAGNOSIS — R001 Bradycardia, unspecified: Secondary | ICD-10-CM | POA: Diagnosis not present

## 2024-10-26 DIAGNOSIS — M170 Bilateral primary osteoarthritis of knee: Secondary | ICD-10-CM | POA: Diagnosis not present

## 2024-10-26 DIAGNOSIS — E559 Vitamin D deficiency, unspecified: Secondary | ICD-10-CM | POA: Diagnosis not present

## 2024-10-26 DIAGNOSIS — I10 Essential (primary) hypertension: Secondary | ICD-10-CM

## 2024-10-26 DIAGNOSIS — E66811 Obesity, class 1: Secondary | ICD-10-CM | POA: Diagnosis not present

## 2024-10-26 DIAGNOSIS — Z6833 Body mass index (BMI) 33.0-33.9, adult: Secondary | ICD-10-CM | POA: Diagnosis not present

## 2024-10-26 MED ORDER — VITAMIN D (ERGOCALCIFEROL) 1.25 MG (50000 UNIT) PO CAPS: 50000.0000 [IU] | ORAL_CAPSULE | ORAL | 0 refills | Status: DC

## 2024-10-26 NOTE — Progress Notes (Signed)
 [place]  Office: [phone]  /  Fax: [phone]  WEIGHT SUMMARY AND BIOMETRICS  Starting Date: 09/07/24  Starting Weight: 196lb   Weight Lost Since Last Visit: 4lb   Vitals Temp: 97.7 F (36.5 C) BP: (!) 159/71 Pulse Rate: (!) 50 SpO2: 100 %   Body Composition  Body Fat %: 44.1 % Fat Mass (lbs): 83.8 lbs Muscle Mass (lbs): 101 lbs Total Body Water (lbs): 72.8 lbs Visceral Fat Rating : 14   HPI  Chief Complaint: OBESITY  Margaret Smith is here to discuss her progress with her obesity treatment plan. She is on the the Category 3 Plan and states she is following her eating plan approximately 90 % of the time. She states she is exercising 30 minutes 5-7 times per week.  Interval History:  Since last office visit she is down 4 lb she is 1.4 pounds of muscle mass and down 5.4 pounds of body fat since last visit This gives her a net weight loss of 6 lb in 7 weeks of medically supervised weight management She has been walking more (other than rainy days) She feels better with walking, getting 30 min most days of the week She is mindful of lean protein and fiber with meals She has been monitoring her blood pressure at home, running better at home Not sleeping well with CPAP since time change She has some aching in her knees from arthritis but this doesn't limit walking time She has some cravings at night  Pharmacotherapy: none  PHYSICAL EXAM:  Blood pressure (!) 159/71, pulse (!) 50, temperature 97.7 F (36.5 C), height 5' 3 (1.6 m), weight 190 lb (86.2 kg), SpO2 100%. Body mass index is 33.66 kg/m.  General: She is appearing cooperative, alert, well developed, and in no acute distress. PSYCH: Has normal mood, affect and thought process.   Lungs: Normal breathing effort, no conversational dyspnea.   ASSESSMENT AND PLAN  TREATMENT PLAN FOR OBESITY:  Recommended Dietary Goals  Margaret Smith is currently in the action stage of change. As such, her goal is to continue weight  management plan. She has agreed to the Category 3 Plan. reduce bread on meal plan  Behavioral Intervention  We discussed the following Behavioral Modification Strategies today: increasing lean protein intake to established goals, increasing fiber rich foods, increasing water intake , work on meal planning and preparation, keeping healthy foods at home, work on managing stress, creating time for self-care and relaxation, continue to practice mindfulness when eating, and planning for success.  Additional resources provided today: NA  Recommended Physical Activity Goals  Margaret Smith has been advised to work up to 150 minutes of moderate intensity aerobic activity a week and strengthening exercises 2-3 times per week for cardiovascular health, weight loss maintenance and preservation of muscle mass.   She has agreed to Increase the intensity, frequency or duration of aerobic exercises    Pharmacotherapy changes for the treatment of obesity: None  ASSOCIATED CONDITIONS ADDRESSED TODAY  Primary hypertension Blood pressure is elevated today She reports good compliance taking losartan  50 mg once daily, increased from 25 mg last visit.  She denies adverse side effects.  She has started monitoring home blood pressure readings which are much better, reportedly in the 120s over 80s. She is scheduled for cardiology follow-up next month.  Vitamin D deficiency Last vitamin D Lab Results  Component Value Date   VD25OH 21.3 (L) 09/07/2024  She is taking vitamin D 50,000 IU once weekly.  Energy level is starting to improve.  Recommend bone density testing every 2 years.  Repeat lab in January.  -     Vitamin D (Ergocalciferol); Take 1 capsule (50,000 Units total) by mouth every 7 (seven) days.  Dispense: 5 capsule; Refill: 0  Obesity, Class I, BMI 30-34.9 Improving Continue with lifestyle change alone  BMI 33.0-33.9,adult  Primary osteoarthritis of both knees Stable.  She has been able to walk 30+  minutes several days a week without much knee pain.  She has some aching after walking on cold weather days.  She is using Tylenol arthritis as needed.  Bradycardia Unchanged.  Resting heart rate today 50 bpm.  She is not on any rate lowering medication.  She denies feeling dizzy at rest or with exercise.  She is scheduled to see [facility] cardiology back on 12/26.    She was informed of the importance of frequent follow up visits to maximize her success with intensive lifestyle modifications for her multiple health conditions.   ATTESTASTION STATEMENTS:  Reviewed by clinician on day of visit: allergies, medications, problem list, medical history, surgical history, family history, social history, and previous encounter notes pertinent to obesity diagnosis.   I have personally spent 30 minutes total time today in preparation, patient care, nutritional counseling and education,  and documentation for this visit, including the following: review of most recent clinical lab tests, prescribing medications/ refilling medications, reviewing medical assistant documentation, review and interpretation of bioimpedence results.     [name], D.O. DABFM, DABOM [facility] [address] [place], [place] [address] [phone]

## 2024-11-18 ENCOUNTER — Ambulatory Visit

## 2024-11-18 VITALS — BP 150/52 | HR 81 | Ht 63.0 in | Wt 193.0 lb

## 2024-11-18 DIAGNOSIS — Z0000 Encounter for general adult medical examination without abnormal findings: Secondary | ICD-10-CM | POA: Diagnosis not present

## 2024-11-18 DIAGNOSIS — Z1231 Encounter for screening mammogram for malignant neoplasm of breast: Secondary | ICD-10-CM | POA: Diagnosis not present

## 2024-11-18 NOTE — Progress Notes (Signed)
 Chief Complaint  Patient presents with   Medicare Wellness     Subjective:   Margaret Smith is a 73 y.o. female who presents for a Medicare Annual Wellness Visit.  Visit info / Clinical Intake: Medicare Wellness Visit Type:: Subsequent Annual Wellness Visit Persons participating in visit and providing information:: patient Medicare Wellness Visit Mode:: In-person (required for WTM) Interpreter Needed?: No Pre-visit prep was completed: yes AWV questionnaire completed by patient prior to visit?: yes Date:: 11/14/24 Living arrangements:: (!) lives alone Patient's Overall Health Status Rating: good Typical amount of pain: some Does pain affect daily life?: no Are you currently prescribed opioids?: no  Dietary Habits and Nutritional Risks How many meals a day?: 3 Eats fruit and vegetables daily?: yes Most meals are obtained by: preparing own meals In the last 2 weeks, have you had any of the following?: none Diabetic:: no  Functional Status Activities of Daily Living (to include ambulation/medication): Independent Ambulation: Independent Medication Administration: Independent Home Management (perform basic housework or laundry): Independent Manage your own finances?: yes Primary transportation is: driving Concerns about vision?: no *vision screening is required for WTM* Concerns about hearing?: no  Fall Screening Falls in the past year?: 0 Number of falls in past year: 0 Was there an injury with Fall?: 0 Fall Risk Category Calculator: 0 Patient Fall Risk Level: Low Fall Risk  Fall Risk Patient at Risk for Falls Due to: No Fall Risks Fall risk Follow up: Falls evaluation completed  Home and Transportation Safety: All rugs have non-skid backing?: N/A, no rugs All stairs or steps have railings?: N/A, no stairs Grab bars in the bathtub or shower?: yes Have non-skid surface in bathtub or shower?: yes Good home lighting?: yes Regular seat belt use?: yes Hospital  stays in the last year:: no  Cognitive Assessment Difficulty concentrating, remembering, or making decisions? : no Will 6CIT or Mini Cog be Completed: yes What year is it?: 0 points What month is it?: 0 points Give patient an address phrase to remember (5 components): [address] [place], [place] [address] About what time is it?: 0 points Count backwards from 20 to 1: 0 points Say the months of the year in reverse: 0 points Repeat the address phrase from earlier: 0 points 6 CIT Score: 0 points  Advance Directives (For Healthcare) Does Patient Have a Medical Advance Directive?: No Would patient like information on creating a medical advance directive?: No - Patient declined  Reviewed/Updated  Reviewed/Updated: Reviewed All (Medical, Surgical, Family, Medications, Allergies, Care Teams, Patient Goals)    Allergies (verified) Patient has no known allergies.   Current Medications (verified) Outpatient Encounter Medications as of 11/18/2024  Medication Sig   acetaminophen (TYLENOL 8 HOUR ARTHRITIS PAIN) 650 MG CR tablet Take 650 mg by mouth every 8 (eight) hours as needed for pain.   aspirin  EC 81 MG tablet Take 81 mg by mouth daily. Swallow whole.   losartan  (COZAAR ) 50 MG tablet Take 1 tablet (50 mg total) by mouth daily.   OVER THE COUNTER MEDICATION at bedtime as needed. Calm   pramipexole (MIRAPEX) 0.5 MG tablet Take 0.5 mg by mouth at bedtime.   rosuvastatin  (CRESTOR ) 10 MG tablet TAKE 1 TABLET BY MOUTH EVERY DAY AT BEDTIME   traZODone  (DESYREL ) 50 MG tablet TAKE 1/2 TO 1 TABLET BY MOUTH AT BEDTIME AS NEEDED FOR SLEEP   Vitamin D , Ergocalciferol , (DRISDOL ) 1.25 MG (50000 UNIT) CAPS capsule Take 1 capsule (50,000 Units total) by mouth every 7 (seven) days.  calcium  carbonate (OSCAL) 1500 (600 Ca) MG TABS tablet Take 600 mg of elemental calcium  by mouth daily with breakfast. (Patient not taking: Reported on 11/18/2024)   No facility-administered encounter medications on file as of  11/18/2024.    History: Past Medical History:  Diagnosis Date   Depression    Edema    lower legs/ feet   H/O blood clots    High cholesterol    Hypertension    Osteoarthritis    Pneumonia    Sleep apnea    Thrombocytopenia 11/15/2020   Past Surgical History:  Procedure Laterality Date   CARPAL TUNNEL RELEASE  1980s   bilateral.    LIPOMA EXCISION     left forearm.    REPLACEMENT TOTAL KNEE Left    Dr. Pill   Family History  Problem Relation Age of Onset   Hypertension Mother    Hyperlipidemia Mother    Diabetes Mother    Heart attack Mother    Stroke Mother    Seizures Mother    Heart disease Mother    Depression Mother    Liver disease Mother    Obesity Mother    Obesity Father    Cancer Father    Heart disease Father    Hypertension Father    Diabetes Father    Pancreatic cancer Father    Hypertension Sister    Kidney disease Sister    Hypertension Brother    Diabetes Maternal Grandfather    Breast cancer Paternal Aunt    Social History   Occupational History   Occupation: Attention officer    Comment: [employer]  Tobacco Use   Smoking status: Former    Current packs/day: 0.00    Average packs/day: 0.3 packs/day for 15.0 years (3.8 ttl pk-yrs)    Types: Cigarettes    Start date: 04/08/1989    Quit date: 04/08/2004    Years since quitting: 20.6   Smokeless tobacco: Never  Vaping Use   Vaping status: Never Used  Substance and Sexual Activity   Alcohol use: No   Drug use: No   Sexual activity: Never   Tobacco Counseling Counseling given: Not Answered  SDOH Screenings   Food Insecurity: No Food Insecurity (11/18/2024)  Housing: Low Risk (11/18/2024)  Transportation Needs: No Transportation Needs (11/18/2024)  Utilities: Not At Risk (11/18/2024)  Alcohol Screen: Low Risk (11/08/2023)  Depression (PHQ2-9): Low Risk (11/18/2024)  Financial Resource Strain: Medium Risk (11/14/2024)  Physical Activity: Sufficiently Active (11/18/2024)   Social Connections: Moderately Integrated (11/18/2024)  Stress: Stress Concern Present (11/18/2024)  Tobacco Use: Medium Risk (11/18/2024)  Health Literacy: Adequate Health Literacy (11/18/2024)   See flowsheets for full screening details  Depression Screen PHQ 2 & 9 Depression Scale- Over the past 2 weeks, how often have you been bothered by any of the following problems? Little interest or pleasure in doing things: 0 Feeling down, depressed, or hopeless (PHQ Adolescent also includes...irritable): 0 PHQ-2 Total Score: 0     Goals Addressed             This Visit's Progress    Patient Stated       Patient states she would like to lose weight and keep it off.              Objective:    Today's Vitals   11/18/24 1313 11/18/24 1403  BP: (!) 151/56 (!) 150/52  Pulse: 81   SpO2: 100%   Weight: 193 lb (87.5 kg)   Height: 5'  3 (1.6 m)    Body mass index is 34.19 kg/m.  Hearing/Vision screen No results found. Immunizations and Health Maintenance Health Maintenance  Topic Date Due   COVID-19 Vaccine (10 - Moderna risk 2025-26 season) 03/02/2025   Medicare Annual Wellness (AWV)  11/18/2025   Mammogram  12/23/2025   Colonoscopy  01/29/2033   DTaP/Tdap/Td (3 - Td or Tdap) 12/15/2033   Pneumococcal Vaccine: 50+ Years  Completed   Influenza Vaccine  Completed   Bone Density Scan  Completed   Hepatitis C Screening  Completed   Zoster Vaccines- Shingrix   Completed   Meningococcal B Vaccine  Aged Out        Assessment/Plan:  This is a routine wellness examination for Margaret Smith.  Patient Care Team: Alvan Dorothyann BIRCH, MD as PCP - General (Family Medicine) Oneita Tresea DEL, OD as Referring Physician (Optometry) Merilee Oneil Faden, MD as Referring Physician (Cardiology)  I have personally reviewed and noted the following in the patients chart:   Medical and social history Use of alcohol, tobacco or illicit drugs  Current medications and supplements  including opioid prescriptions. Functional ability and status Nutritional status Physical activity Advanced directives List of other physicians Hospitalizations, surgeries, and ER visits in previous 12 months Vitals Screenings to include cognitive, depression, and falls Referrals and appointments  Orders Placed This Encounter  Procedures   MM 3D SCREENING MAMMOGRAM BILATERAL BREAST    Standing Status:   Future    Expiration Date:   11/18/2025    Reason for Exam (SYMPTOM  OR DIAGNOSIS REQUIRED):   screening for breast cancer    Preferred imaging location?:   [facility]   In addition, I have reviewed and discussed with patient certain preventive protocols, quality metrics, and best practice recommendations. A written personalized care plan for preventive services as well as general preventive health recommendations were provided to patient.   Bonny Jon Mayor, CMA   11/18/2024   Return in 1 year (on 11/18/2025).  After Visit Summary: (In Person-Declined) Patient declined AVS at this time.  Nurse Notes:   Margaret Smith is a 73 y.o. female patient of Dorothyann Alvan, MD who had a Medicare Annual Wellness Visit today. She reports that she is socially active and does interact with friends/family regularly. She is moderately physically active.

## 2024-11-18 NOTE — Patient Instructions (Signed)
 Margaret Smith,  Thank you for taking the time for your Medicare Wellness Visit. I appreciate your continued commitment to your health goals. Please review the care plan we discussed, and feel free to reach out if I can assist you further.  Please note that Annual Wellness Visits do not include a physical exam. Some assessments may be limited, especially if the visit was conducted virtually. If needed, we may recommend an in-person follow-up with your provider.  Ongoing Care Seeing your primary care provider every 3 to 6 months helps us  monitor your health and provide consistent, personalized care.   Referrals If a referral was made during today's visit and you haven't received any updates within two weeks, please contact the referred provider directly to check on the status.  Recommended Screenings:  Health Maintenance  Topic Date Due   Medicare Annual Wellness Visit  10/27/2024   COVID-19 Vaccine (10 - Moderna risk 2025-26 season) 03/02/2025   Breast Cancer Screening  12/23/2025   Colon Cancer Screening  01/29/2033   DTaP/Tdap/Td vaccine (3 - Td or Tdap) 12/15/2033   Pneumococcal Vaccine for age over 37  Completed   Flu Shot  Completed   Osteoporosis screening with Bone Density Scan  Completed   Hepatitis C Screening  Completed   Zoster (Shingles) Vaccine  Completed   Meningitis B Vaccine  Aged Out       11/18/2024    1:29 PM  Advanced Directives  Does Patient Have a Medical Advance Directive? No  Would patient like information on creating a medical advance directive? No - Patient declined    Vision: Annual vision screenings are recommended for early detection of glaucoma, cataracts, and diabetic retinopathy. These exams can also reveal signs of chronic conditions such as diabetes and high blood pressure.  Dental: Annual dental screenings help detect early signs of oral cancer, gum disease, and other conditions linked to overall health, including heart disease and  diabetes.  Please see the attached documents for additional preventive care recommendations.

## 2024-11-23 ENCOUNTER — Encounter: Payer: Self-pay | Admitting: Family Medicine

## 2024-11-23 ENCOUNTER — Ambulatory Visit: Admitting: Family Medicine

## 2024-11-23 VITALS — BP 163/63 | HR 67 | Ht 63.0 in | Wt 187.0 lb

## 2024-11-23 DIAGNOSIS — E559 Vitamin D deficiency, unspecified: Secondary | ICD-10-CM | POA: Diagnosis not present

## 2024-11-23 DIAGNOSIS — G4733 Obstructive sleep apnea (adult) (pediatric): Secondary | ICD-10-CM | POA: Diagnosis not present

## 2024-11-23 DIAGNOSIS — I10 Essential (primary) hypertension: Secondary | ICD-10-CM | POA: Diagnosis not present

## 2024-11-23 DIAGNOSIS — E66811 Obesity, class 1: Secondary | ICD-10-CM

## 2024-11-23 DIAGNOSIS — Z6833 Body mass index (BMI) 33.0-33.9, adult: Secondary | ICD-10-CM | POA: Diagnosis not present

## 2024-11-23 MED ORDER — VITAMIN D (ERGOCALCIFEROL) 1.25 MG (50000 UNIT) PO CAPS: 50000.0000 [IU] | ORAL_CAPSULE | ORAL | 0 refills | Status: DC

## 2024-11-23 NOTE — Progress Notes (Signed)
 Office: [phone]  /  Fax: [phone]  WEIGHT SUMMARY AND BIOMETRICS  Starting Date: 09/07/24  Starting Weight: 196lb   Weight Lost Since Last Visit: 3lb   Vitals BP: (!) 163/63 Pulse Rate: 67 SpO2: 99 %   Body Composition  Body Fat %: 44 % Fat Mass (lbs): 82.6 lbs Muscle Mass (lbs): 99.8 lbs Total Body Water (lbs): 74 lbs Visceral Fat Rating : 13   HPI  Chief Complaint: OBESITY  Margaret Smith is here to discuss her progress with her obesity treatment plan. She is on the the Category 3 Plan and states she is following her eating plan approximately 95 % of the time. She states she is exercising 20-30 minutes 4 times per week.  Interval History:  Since last office visit she is down 3 lb She is net down 9 lb in 2.5 mos of medically supervised weight management This is a 4.5% TBW loss She is sticking to cat 3 meal plan most of the time She is eating more lean protein (greek yogurt) and fruit She is walking 20-30 min 4 days/ wk (pending weather) She has access to the senior center (not going) She is wearing CPAP nightly She a friend coming to visit soon and she plans to eat out a bit more  Pharmacotherapy: none  PHYSICAL EXAM:  Blood pressure (!) 163/63, pulse 67, height 5' 3 (1.6 m), weight 187 lb (84.8 kg), SpO2 99%. Body mass index is 33.13 kg/m.  General: She is overweight, cooperative, alert, well developed, and in no acute distress. PSYCH: Has normal mood, affect and thought process.   Lungs: Normal breathing effort, no conversational dyspnea.   ASSESSMENT AND PLAN  TREATMENT PLAN FOR OBESITY:  Recommended Dietary Goals  Saree is currently in the action stage of change. As such, her goal is to continue weight management plan. She has agreed to the Category 3 Plan. - 100 snack cal = 1400 cal / day goal  Behavioral Intervention  We discussed the following Behavioral Modification Strategies today: increasing lean protein intake to established goals,  increasing fiber rich foods, increasing water intake , work on meal planning and preparation, keeping healthy foods at home, identifying sources and decreasing liquid calories, avoiding temptations and identifying enticing environmental cues, continue to practice mindfulness when eating, planning for success, better snacking choices, and celebration eating strategies.  Additional resources provided today: NA  Recommended Physical Activity Goals  Amaria has been advised to work up to 150 minutes of moderate intensity aerobic activity a week and strengthening exercises 2-3 times per week for cardiovascular health, weight loss maintenance and preservation of muscle mass.   She has agreed to Increase the intensity, frequency or duration of aerobic exercises   Aim for walking 45-60 min 4-5 days/ wk  Discussed 'bad weather' plan for indoor workouts (has not yet started)  Pharmacotherapy changes for the treatment of obesity: none  ASSOCIATED CONDITIONS ADDRESSED TODAY  Primary hypertension Blood pressure is elevated today and has been for the past 4 office visits She is currently on losartan  50 mg daily She denies headache or chest pain She is scheduled for cardiology follow-up on 12/26 and will likely need adjustments to antihypertensive medications  Vitamin D  deficiency -     Vitamin D  (Ergocalciferol ); Take 1 capsule (50,000 Units total) by mouth every 7 (seven) days.  Dispense: 5 capsule; Refill: 0 Last vitamin D  Lab Results  Component Value Date   VD25OH 21.3 (L) 09/07/2024  She is currently on vitamin D  50,000  IU once weekly Energy level is improving.  Repeat vitamin D  level in 1 to 2 months  Obesity, Class I, BMI 30-34.9 Improving without use of antiobesity medication, focusing on dietary change and exercise alone She declined referral to prep program due to distance from home She does not have insurance coverage for Silver sneakers She has access to the senior center in [facility] for indoor workout options Plan to transition to Mediterranean style diet over the next 2 to 3 months in preparation for maintenance phase  BMI 33.0-33.9,adult  OSA on CPAP She reports good compliance wearing CPAP nightly for OSA Continue active plan for improving sleep hygiene and high-quality sleep to 7 to 8 hours at night   She was informed of the importance of frequent follow up visits to maximize her success with intensive lifestyle modifications for her multiple health conditions.   ATTESTASTION STATEMENTS:  Reviewed by clinician on day of visit: allergies, medications, problem list, medical history, surgical history, family history, social history, and previous encounter notes pertinent to obesity diagnosis.   I have personally spent 24 minutes total time today in preparation, patient care, nutritional counseling and education,  and documentation for this visit, including the following: review of most recent clinical lab tests, prescribing medications/ refilling medications, reviewing medical assistant documentation, review and interpretation of bioimpedence results.     [name], D.O. DABFM, DABOM [facility] [address] [place], [place] [address] [phone]

## 2024-12-07 ENCOUNTER — Ambulatory Visit

## 2024-12-07 ENCOUNTER — Ambulatory Visit
Admission: EM | Admit: 2024-12-07 | Discharge: 2024-12-07 | Disposition: A | Discharge status: Home / Self Care | Attending: Family Medicine | Admitting: Family Medicine

## 2024-12-07 DIAGNOSIS — S60921A Unspecified superficial injury of right hand, initial encounter: Secondary | ICD-10-CM | POA: Diagnosis not present

## 2024-12-07 DIAGNOSIS — S6991XA Unspecified injury of right wrist, hand and finger(s), initial encounter: Secondary | ICD-10-CM | POA: Diagnosis not present

## 2024-12-07 DIAGNOSIS — M79641 Pain in right hand: Secondary | ICD-10-CM | POA: Diagnosis not present

## 2024-12-07 MED ORDER — CELECOXIB 200 MG PO CAPS: 200.0000 mg | ORAL_CAPSULE | Freq: Every day | ORAL | 0 refills | Status: AC

## 2024-12-07 NOTE — Discharge Instructions (Addendum)
 Advised patient of right hand x-ray results with hardcopy and image provided.  Advise May RICE affected area of right hand for 30 minutes 3 times daily for the next 3 days.  Advised patient to may continue to take OTC Tylenol/Tylenol arthritis for pain advised if symptoms worsen and/or unresolved please follow-up with your PCP for [facility] orthopedics for further evaluation.

## 2024-12-07 NOTE — ED Triage Notes (Addendum)
 Pt c/o RT hand injury since last night when she fell. Swelling and bruising noted. Limited ROM. Denies pain in wrist. Tylenol prn.

## 2024-12-07 NOTE — ED Provider Notes (Signed)
 " [facility]    CSN: [id number] Arrival date & time: 12/07/24  0901      History   Chief Complaint Chief Complaint  Patient presents with   Hand Injury    RT    HPI Margaret Smith is a 73 y.o. female.   HPI pleasant 73 year old female presents with right hand pain secondary to right hand injury after falling last night at her home.  PMH significant for obesity, paroxysmal A-fib, and HTN.  Past Medical History:  Diagnosis Date   Depression    Edema    lower legs/ feet   H/O blood clots    High cholesterol    Hypertension    Osteoarthritis    Pneumonia    Sleep apnea    Thrombocytopenia 11/15/2020    Patient Active Problem List   Diagnosis Date Noted   Purpura senilis 08/02/2024   Insomnia 02/02/2024   Encounter for screening for osteoporosis 10/28/2023   Osteoporosis 10/28/2023   Mitral regurgitation 01/24/2023   Tricuspid regurgitation 01/24/2023   OSA on CPAP 11/15/2022   Paroxysmal A-fib (HCC) 11/12/2022   Primary hypertension 09/17/2022   Thrombocytopenia 11/15/2020   White coat syndrome without diagnosis of hypertension 01/21/2017   Bradycardia 12/28/2015   Thyromegaly 12/28/2015   Primary osteoarthritis of right knee, post left knee arthroplasty 06/26/2015    Past Surgical History:  Procedure Laterality Date   CARPAL TUNNEL RELEASE  1980s   bilateral.    LIPOMA EXCISION     left forearm.    REPLACEMENT TOTAL KNEE Left    Dr. Pill    OB History     Gravida  0   Para  0   Term  0   Preterm  0   AB  0   Living  0      SAB  0   IAB  0   Ectopic  0   Multiple  0   Live Births  0            Home Medications    Prior to Admission medications  Medication Sig Start Date End Date Taking? Authorizing Provider  celecoxib (CELEBREX) 200 MG capsule Take 1 capsule (200 mg total) by mouth daily for 15 days. 12/07/24 12/22/24 Yes Teddy Sharper, FNP  acetaminophen (TYLENOL 8 HOUR ARTHRITIS PAIN) 650 MG CR tablet Take  650 mg by mouth every 8 (eight) hours as needed for pain. 02/21/22   [provider]  aspirin  EC 81 MG tablet Take 81 mg by mouth daily. Swallow whole.    Alvan Dorothyann BIRCH, MD  calcium  carbonate (OSCAL) 1500 (600 Ca) MG TABS tablet Take 600 mg of elemental calcium  by mouth daily with breakfast. Patient not taking: Reported on 11/18/2024    [provider]  losartan  (COZAAR ) 50 MG tablet Take 1 tablet (50 mg total) by mouth daily. 09/22/24   Bowen, Darice BRAVO, DO  OVER THE COUNTER MEDICATION at bedtime as needed. Calm    [provider]  pramipexole (MIRAPEX) 0.5 MG tablet Take 0.5 mg by mouth at bedtime. 04/11/23   [provider]  rosuvastatin  (CRESTOR ) 10 MG tablet TAKE 1 TABLET BY MOUTH EVERY DAY AT BEDTIME 04/07/24   Alvan Dorothyann BIRCH, MD  traZODone  (DESYREL ) 50 MG tablet TAKE 1/2 TO 1 TABLET BY MOUTH AT BEDTIME AS NEEDED FOR SLEEP 09/15/24   Alvan Dorothyann BIRCH, MD  Vitamin D , Ergocalciferol , (DRISDOL ) 1.25 MG (50000 UNIT) CAPS capsule Take 1 capsule (50,000 Units total) by  mouth every 7 (seven) days. 11/23/24   Bowen, Darice BRAVO, DO    Family History Family History  Problem Relation Age of Onset   Hypertension Mother    Hyperlipidemia Mother    Diabetes Mother    Heart attack Mother    Stroke Mother    Seizures Mother    Heart disease Mother    Depression Mother    Liver disease Mother    Obesity Mother    Obesity Father    Cancer Father    Heart disease Father    Hypertension Father    Diabetes Father    Pancreatic cancer Father    Hypertension Sister    Kidney disease Sister    Hypertension Brother    Diabetes Maternal Grandfather    Breast cancer Paternal Aunt     Social History Social History[1]   Allergies   Patient has no known allergies.   Review of Systems Review of Systems  Musculoskeletal:        Right hand pain secondary to right hand injury that occurred last night at her home     Physical Exam Triage Vital  Signs ED Triage Vitals  Encounter Vitals Group     BP 12/07/24 0917 (!) 165/67     Girls Systolic BP Percentile --      Girls Diastolic BP Percentile --      Boys Systolic BP Percentile --      Boys Diastolic BP Percentile --      Pulse Rate 12/07/24 0917 (!) 50     Resp 12/07/24 0917 17     Temp 12/07/24 0917 97.9 F (36.6 C)     Temp Source 12/07/24 0917 Oral     SpO2 12/07/24 0917 98 %     Weight --      Height --      Head Circumference --      Peak Flow --      Pain Score 12/07/24 0916 10     Pain Loc --      Pain Education --      Exclude from Growth Chart --    No data found.  Updated Vital Signs BP (!) 165/67 (BP Location: Left Arm)   Pulse (!) 50   Temp 97.9 F (36.6 C) (Oral)   Resp 17   SpO2 98%   Visual Acuity Right Eye Distance:   Left Eye Distance:   Bilateral Distance:    Right Eye Near:   Left Eye Near:    Bilateral Near:     Physical Exam Vitals and nursing note reviewed.  Constitutional:      Appearance: Normal appearance. She is obese.  HENT:     Head: Normocephalic and atraumatic.     Mouth/Throat:     Mouth: Mucous membranes are moist.     Pharynx: Oropharynx is clear.  Eyes:     Extraocular Movements: Extraocular movements intact.     Conjunctiva/sclera: Conjunctivae normal.     Pupils: Pupils are equal, round, and reactive to light.  Cardiovascular:     Rate and Rhythm: Normal rate and regular rhythm.     Heart sounds: Normal heart sounds.  Pulmonary:     Effort: Pulmonary effort is normal.     Breath sounds: Normal breath sounds. No wheezing, rhonchi or rales.  Musculoskeletal:        General: Normal range of motion.     Comments: Right hand (dorsum): Moderate soft tissue swelling noted, grip 3/5, neurovascular/neurosensory  intact, no deformity noted  Skin:    General: Skin is warm and dry.  Neurological:     General: No focal deficit present.     Mental Status: She is alert and oriented to person, place, and time. Mental  status is at baseline.  Psychiatric:        Mood and Affect: Mood normal.        Behavior: Behavior normal.      UC Treatments / Results  Labs (all labs ordered are listed, but only abnormal results are displayed) Labs Reviewed - No data to display  EKG   Radiology DG Hand Complete Right Result Date: 12/07/2024 EXAM: 3 OR MORE VIEW(S) XRAY OF THE HAND 12/07/2024 09:36:54 AM COMPARISON: None available. CLINICAL HISTORY: Injury from fall FINDINGS: BONES AND JOINTS: Benign appearing cystic area in the midportion of the capitate bone. Osteoarthritic change in the second and third DIP joints. Calcification in the triangular fibrocartilage region. No acute fracture. No malalignment. SOFT TISSUES: The soft tissues are unremarkable. IMPRESSION: 1. No acute fracture or dislocation. Degenerative changes about the hand. Electronically signed by: Michaeline Blanch MD 12/07/2024 11:10 AM EST RP Workstation: [id number]    Procedures Procedures (including critical care time)  Medications Ordered in UC Medications - No data to display  Initial Impression / Assessment and Plan / UC Course  I have reviewed the triage vital signs and the nursing notes.  Pertinent labs & imaging results that were available during my care of the patient were reviewed by me and considered in my medical decision making (see chart for details).     MDM: 1.  Right hand pain-right hand x-ray results revealed above, patient advised with hardcopy and image provided.,  Rx'd Celebrex 200 mg capsule: Canceled due to paroxysmal atrial fibrillation advised may use OTC Tylenol/Tylenol arthritis for hand pain; 2.  Injury of right hand, initial encounter-Ace wrap applied to right hand prior to discharge today.  Advised patient to RICE affected area RICE hand for 30 minutes 3 times daily for the next 3 days. Advised patient of right hand x-ray results with hardcopy and image provided.  Advise May RICE affected area of right hand for 30  minutes 3 times daily for the next 3 days.  Advised patient to take Celebrex daily with food to completion.  Advised if symptoms worsen and/or unresolved please follow-up with your PCP for [facility] orthopedics for further evaluation.  Patient discharged home, hemodynamically stable. Final Clinical Impressions(s) / UC Diagnoses   Final diagnoses:  Right hand pain  Injury of right hand, initial encounter     Discharge Instructions      Advised patient of right hand x-ray results with hardcopy and image provided.  Advise May RICE affected area of right hand for 30 minutes 3 times daily for the next 3 days.  Advised patient to may continue to take OTC Tylenol/Tylenol arthritis for pain advised if symptoms worsen and/or unresolved please follow-up with your PCP for [facility] orthopedics for further evaluation.     ED Prescriptions     Medication Sig Dispense Auth. Provider   celecoxib (CELEBREX) 200 MG capsule Take 1 capsule (200 mg total) by mouth daily for 15 days. 15 capsule Janie Capp, FNP      PDMP not reviewed this encounter.    [1]  Social History Tobacco Use   Smoking status: Former    Current packs/day: 0.00    Average packs/day: 0.3 packs/day for 15.0 years (3.8 ttl pk-yrs)  Types: Cigarettes    Start date: 04/08/1989    Quit date: 04/08/2004    Years since quitting: 20.6   Smokeless tobacco: Never  Vaping Use   Vaping status: Never Used  Substance Use Topics   Alcohol use: No   Drug use: No     Teddy Sharper, FNP 12/07/24 1144  "

## 2024-12-17 ENCOUNTER — Other Ambulatory Visit: Payer: Self-pay | Admitting: Family Medicine

## 2024-12-17 DIAGNOSIS — I10 Essential (primary) hypertension: Secondary | ICD-10-CM

## 2024-12-22 ENCOUNTER — Inpatient Hospital Stay: Admitting: Hematology & Oncology

## 2024-12-22 ENCOUNTER — Encounter: Payer: Self-pay | Admitting: Hematology & Oncology

## 2024-12-22 ENCOUNTER — Inpatient Hospital Stay: Attending: Hematology & Oncology

## 2024-12-22 ENCOUNTER — Other Ambulatory Visit: Payer: Self-pay

## 2024-12-22 VITALS — BP 130/44 | HR 57 | Temp 98.5°F | Resp 19 | Wt 191.0 lb

## 2024-12-22 DIAGNOSIS — D696 Thrombocytopenia, unspecified: Secondary | ICD-10-CM

## 2024-12-22 LAB — CBC WITH DIFFERENTIAL (CANCER CENTER ONLY)
Abs Immature Granulocytes: 0 K/uL (ref 0.00–0.07)
Basophils Absolute: 0 K/uL (ref 0.0–0.1)
Basophils Relative: 1 %
Eosinophils Absolute: 0.1 K/uL (ref 0.0–0.5)
Eosinophils Relative: 1 %
HCT: 40.7 % (ref 36.0–46.0)
Hemoglobin: 13 g/dL (ref 12.0–15.0)
Immature Granulocytes: 0 %
Lymphocytes Relative: 29 %
Lymphs Abs: 1.6 K/uL (ref 0.7–4.0)
MCH: 28.7 pg (ref 26.0–34.0)
MCHC: 31.9 g/dL (ref 30.0–36.0)
MCV: 89.8 fL (ref 80.0–100.0)
Monocytes Absolute: 0.6 K/uL (ref 0.1–1.0)
Monocytes Relative: 10 %
Neutro Abs: 3.1 K/uL (ref 1.7–7.7)
Neutrophils Relative %: 59 %
Platelet Count: 108 K/uL — ABNORMAL LOW (ref 150–400)
RBC: 4.53 MIL/uL (ref 3.87–5.11)
RDW: 14.6 % (ref 11.5–15.5)
WBC Count: 5.3 K/uL (ref 4.0–10.5)
nRBC: 0 % (ref 0.0–0.2)

## 2024-12-22 LAB — CMP (CANCER CENTER ONLY)
ALT: 41 U/L (ref 0–44)
AST: 33 U/L (ref 15–41)
Albumin: 4.6 g/dL (ref 3.5–5.0)
Alkaline Phosphatase: 86 U/L (ref 38–126)
Anion gap: 11 (ref 5–15)
BUN: 25 mg/dL — ABNORMAL HIGH (ref 8–23)
CO2: 26 mmol/L (ref 22–32)
Calcium: 9.5 mg/dL (ref 8.9–10.3)
Chloride: 105 mmol/L (ref 98–111)
Creatinine: 0.72 mg/dL (ref 0.44–1.00)
GFR, Estimated: >60 mL/min
Glucose, Bld: 96 mg/dL (ref 70–99)
Potassium: 4.6 mmol/L (ref 3.5–5.1)
Sodium: 141 mmol/L (ref 135–145)
Total Bilirubin: 0.5 mg/dL (ref 0.0–1.2)
Total Protein: 7.5 g/dL (ref 6.5–8.1)

## 2024-12-22 LAB — SAVE SMEAR(SSMR), FOR PROVIDER SLIDE REVIEW

## 2024-12-22 NOTE — Progress Notes (Signed)
 " Hematology and Oncology Follow Up Visit  Margaret Smith [id number] 1951/11/11 74 y.o. 12/22/2024   Principle Diagnosis:  Thrombocytopenia-likely mild ITP Atrial fibrillation  Previous Therapy: Eliquis 5 mg p.o. twice daily --DC secondary to Watchman procedure.  Current Therapy:   Observation 81mg  asa     Interim History:  Margaret Smith is back for her follow-up.  We last saw her back in September.  Since then, she had been doing quite well.  She is on baby aspirin  now.  She is doing well on the baby aspirin .  There is no bleeding.  She has had no bruising.  She had a Watchman procedure done for the atrial fibrillation.  Her platelet count is doing nicely.  Her platelet count has been holding nice and steady.  She has had no rashes.  She has had no fever.  She has had no problems with Influenza or COVID.  There is been no headache.  She is try to exercise.  She is trying to walk.  She is losing weight and she is happy about this.  Overall, I would say that her performance status is ECOG 1.       Wt Readings from Last 3 Encounters:  12/22/24 191 lb (86.6 kg)  11/23/24 187 lb (84.8 kg)  11/18/24 193 lb (87.5 kg)     Medications:  Current Outpatient Medications:    acetaminophen (TYLENOL 8 HOUR ARTHRITIS PAIN) 650 MG CR tablet, Take 650 mg by mouth every 8 (eight) hours as needed for pain., Disp: , Rfl:    aspirin  EC 81 MG tablet, Take 81 mg by mouth daily. Swallow whole., Disp: , Rfl:    calcium  carbonate (OSCAL) 1500 (600 Ca) MG TABS tablet, Take 600 mg of elemental calcium  by mouth daily with breakfast. (Patient not taking: Reported on 11/18/2024), Disp: , Rfl:    celecoxib  (CELEBREX ) 200 MG capsule, Take 1 capsule (200 mg total) by mouth daily for 15 days., Disp: 15 capsule, Rfl: 0   losartan  (COZAAR ) 50 MG tablet, TAKE 1 TABLET BY MOUTH EVERY DAY, Disp: 90 tablet, Rfl: 0   OVER THE COUNTER MEDICATION, at bedtime as needed. Calm, Disp: , Rfl:    pramipexole (MIRAPEX)  0.5 MG tablet, Take 0.5 mg by mouth at bedtime., Disp: , Rfl:    rosuvastatin  (CRESTOR ) 10 MG tablet, TAKE 1 TABLET BY MOUTH EVERY DAY AT BEDTIME, Disp: 90 tablet, Rfl: 3   traZODone  (DESYREL ) 50 MG tablet, TAKE 1/2 TO 1 TABLET BY MOUTH AT BEDTIME AS NEEDED FOR SLEEP, Disp: 30 tablet, Rfl: 3   Vitamin D , Ergocalciferol , (DRISDOL ) 1.25 MG (50000 UNIT) CAPS capsule, Take 1 capsule (50,000 Units total) by mouth every 7 (seven) days., Disp: 5 capsule, Rfl: 0  Allergies: No Known Allergies  Past Medical History, Surgical history, Social history, and Family History were reviewed and updated.  Review of Systems: Review of Systems  Constitutional: Negative.   HENT:  Negative.    Eyes: Negative.   Respiratory: Negative.    Cardiovascular: Negative.   Gastrointestinal: Negative.   Endocrine: Negative.   Genitourinary: Negative.    Musculoskeletal: Negative.   Skin: Negative.   Neurological: Negative.   Hematological: Negative.   Psychiatric/Behavioral: Negative.      Physical Exam: Vitals:   12/22/24 1200  BP: (!) 130/44  Pulse: (!) 57  Resp: 19  Temp: 98.5 F (36.9 C)  SpO2: 100%     Wt Readings from Last 3 Encounters:  12/22/24 191 lb (86.6 kg)  11/23/24  187 lb (84.8 kg)  11/18/24 193 lb (87.5 kg)    Physical Exam Vitals reviewed.  HENT:     Head: Normocephalic and atraumatic.  Eyes:     Pupils: Pupils are equal, round, and reactive to light.  Cardiovascular:     Rate and Rhythm: Normal rate and regular rhythm.     Heart sounds: Normal heart sounds.  Pulmonary:     Effort: Pulmonary effort is normal.     Breath sounds: Normal breath sounds.  Abdominal:     General: Bowel sounds are normal.     Palpations: Abdomen is soft.  Musculoskeletal:        General: No tenderness or deformity. Normal range of motion.     Cervical back: Normal range of motion.  Lymphadenopathy:     Cervical: No cervical adenopathy.  Skin:    General: Skin is warm and dry.     Findings:  No erythema or rash.  Neurological:     Mental Status: She is alert and oriented to person, place, and time.  Psychiatric:        Behavior: Behavior normal.        Thought Content: Thought content normal.        Judgment: Judgment normal.     Lab Results  Component Value Date   WBC 5.3 12/22/2024   HGB 13.0 12/22/2024   HCT 40.7 12/22/2024   MCV 89.8 12/22/2024   PLT 108 (L) 12/22/2024     Chemistry      Component Value Date/Time   NA 141 12/22/2024 1137   NA 139 08/02/2024 1020   K 4.6 12/22/2024 1137   CL 105 12/22/2024 1137   CO2 26 12/22/2024 1137   BUN 25 (H) 12/22/2024 1137   BUN 15 08/02/2024 1020   CREATININE 0.72 12/22/2024 1137   CREATININE 0.70 10/24/2022 0000   GLU 85 04/12/2015 0000      Component Value Date/Time   CALCIUM  9.5 12/22/2024 1137   ALKPHOS 86 12/22/2024 1137   AST 33 12/22/2024 1137   ALT 41 12/22/2024 1137   BILITOT 0.5 12/22/2024 1137     Impression and Plan: Margaret Smith is a very nice 73 year old African-American female.  She is quite funny.  She used to work for the [employer].  For right now, everything looks great.  Her platelet count is holding nice and steady.  I do not think this will be a problem.    I did look at her blood smear under the microscope.  I really did not see anything that looked suspicious.  We will now plan to get her back in about 4 months.  We will get her through the Winter and see her back in late Spring.  Maude JONELLE Crease, MD 1/14/202612:19 PM "

## 2024-12-27 ENCOUNTER — Ambulatory Visit: Admitting: Family Medicine

## 2024-12-27 ENCOUNTER — Encounter: Payer: Self-pay | Admitting: Family Medicine

## 2024-12-27 VITALS — BP 149/84 | HR 66 | Ht 63.0 in | Wt 187.0 lb

## 2024-12-27 DIAGNOSIS — I10 Essential (primary) hypertension: Secondary | ICD-10-CM | POA: Diagnosis not present

## 2024-12-27 DIAGNOSIS — M170 Bilateral primary osteoarthritis of knee: Secondary | ICD-10-CM

## 2024-12-27 DIAGNOSIS — E559 Vitamin D deficiency, unspecified: Secondary | ICD-10-CM

## 2024-12-27 DIAGNOSIS — E66811 Obesity, class 1: Secondary | ICD-10-CM

## 2024-12-27 DIAGNOSIS — Z6833 Body mass index (BMI) 33.0-33.9, adult: Secondary | ICD-10-CM | POA: Diagnosis not present

## 2024-12-27 MED ORDER — VITAMIN D (ERGOCALCIFEROL) 1.25 MG (50000 UNIT) PO CAPS: 50000.0000 [IU] | ORAL_CAPSULE | ORAL | 0 refills | Status: AC

## 2024-12-27 NOTE — Progress Notes (Signed)
 "  Office: [phone]  /  Fax: [phone]  WEIGHT SUMMARY AND BIOMETRICS  Starting Date: 09/07/24  Starting Weight: 196lb   Weight Lost Since Last Visit: 0lb   Vitals BP: (!) 149/84 Pulse Rate: 66 SpO2: 97 %   Body Composition  Body Fat %: 43.6 % Fat Mass (lbs): 81.8 lbs Muscle Mass (lbs): 100.6 lbs Total Body Water (lbs): 72.8 lbs Visceral Fat Rating : 13     HPI  Chief Complaint: OBESITY  Margaret Smith is here to discuss her progress with her obesity treatment plan. She is on the the Category 3 Plan and states she is following her eating plan approximately 75 % of the time. She states she is exercising 30 minutes 3 times per week.  Interval History:  Since last office visit she is down 0 lb This gives her a net weight loss of 9 lb in 3 mos of medically supervised weight management She was eating out more with company in town and indulged in a few sweets over the holidays She did lose 0.8 lb of body fat and is up 0.8 lb of muscle mass since last visit She is back to walking 50 min 3-5 days/ wk (weather dependent) She denies joint pain with walking  Pharmacotherapy: none  PHYSICAL EXAM:  Blood pressure (!) 149/84, pulse 66, height 5' 3 (1.6 m), weight 187 lb (84.8 kg), SpO2 97%. Body mass index is 33.13 kg/m.  General: She is overweight, cooperative, alert, well developed, and in no acute distress. PSYCH: Has normal mood, affect and thought process.   Lungs: Normal breathing effort, no conversational dyspnea.  ASSESSMENT AND PLAN  TREATMENT PLAN FOR OBESITY:  Recommended Dietary Goals  Margaret Smith is currently in the action stage of change. As such, her goal is to continue weight management plan. She has agreed to the Category 3 Plan. Adjustments to increase fiber intake reviewed on AVS with patient  Behavioral Intervention  We discussed the following Behavioral Modification Strategies today: increasing fiber rich foods, increasing water intake , work on meal  planning and preparation, keeping healthy foods at home, practice mindfulness eating and understand the difference between hunger signals and cravings, avoiding temptations and identifying enticing environmental cues, continue to work on implementation of reduced calorie nutritional plan, and continue to practice mindfulness when eating.  Additional resources provided today: NA  Recommended Physical Activity Goals  Margaret Smith has been advised to work up to 150 minutes of moderate intensity aerobic activity a week and strengthening exercises 2-3 times per week for cardiovascular health, weight loss maintenance and preservation of muscle mass.   She has agreed to Increase the intensity, frequency or duration of aerobic exercises    Pharmacotherapy changes for the treatment of obesity: none  ASSOCIATED CONDITIONS ADDRESSED TODAY  Vitamin D  deficiency Last vitamin D  Lab Results  Component Value Date   VD25OH 21.3 (L) 09/07/2024  Doing well on RX vitamin D  weekly Energy level improving Repeat lab next visit -     Vitamin D  (Ergocalciferol ); Take 1 capsule (50,000 Units total) by mouth every 7 (seven) days.  Dispense: 5 capsule; Refill: 0  Obesity, Class I, BMI 30-34.9 Improving Focus on lifestyle change alone with a goal BMI close to 30.  BMI 33.0-33.9,adult  Primary hypertension Cardiology notes reviewed from 12/03/24 She reports good compliance taking losartan  50 mg daily, this should be taken over by her PCP Avoid use of stimulant anti obesity meds given her age, HTN and A Fib hx  Primary osteoarthritis of both  knees Improving with weight loss and regular walking     She was informed of the importance of frequent follow up visits to maximize her success with intensive lifestyle modifications for her multiple health conditions.   ATTESTASTION STATEMENTS:  Reviewed by clinician on day of visit: allergies, medications, problem list, medical history, surgical history, family  history, social history, and previous encounter notes pertinent to obesity diagnosis.     Margaret Smith, D.O. DABFM, DABOM [facility] [address] [place], [place] [address] [phone] "

## 2024-12-27 NOTE — Patient Instructions (Signed)
 Aim for 45 min of walking 5 days/ wk  Breakfast option: Overnight Oats Plain oats Fairlife milk 1 spoonful of almond butter  Berries  Dinner: 1/4 plate starch Sweet potato, baked potato, beans, butternut squash, carb balance tortilla, Barilla protein pasta (No chips, no fries, no pizza)  You can expand to 2 servings per day: any fresh or frozen  Limit meals out Hydrate well with water

## 2024-12-28 ENCOUNTER — Other Ambulatory Visit: Payer: Self-pay | Admitting: Medical Genetics

## 2024-12-29 ENCOUNTER — Other Ambulatory Visit: Payer: Self-pay | Admitting: Family Medicine

## 2024-12-30 ENCOUNTER — Other Ambulatory Visit: Payer: Self-pay | Admitting: Medical Genetics

## 2024-12-30 DIAGNOSIS — Z006 Encounter for examination for normal comparison and control in clinical research program: Secondary | ICD-10-CM

## 2025-01-03 ENCOUNTER — Ambulatory Visit (HOSPITAL_BASED_OUTPATIENT_CLINIC_OR_DEPARTMENT_OTHER)

## 2025-01-10 ENCOUNTER — Ambulatory Visit (HOSPITAL_BASED_OUTPATIENT_CLINIC_OR_DEPARTMENT_OTHER)

## 2025-01-26 ENCOUNTER — Ambulatory Visit: Admitting: Family Medicine

## 2025-02-14 ENCOUNTER — Ambulatory Visit (HOSPITAL_BASED_OUTPATIENT_CLINIC_OR_DEPARTMENT_OTHER)

## 2025-04-20 ENCOUNTER — Inpatient Hospital Stay: Admitting: Hematology & Oncology

## 2025-04-20 ENCOUNTER — Inpatient Hospital Stay

## 2025-11-22 ENCOUNTER — Ambulatory Visit
